# Patient Record
Sex: Female | Born: 1935 | Race: White | Hispanic: No | Smoking: Former smoker
Health system: Southern US, Community
[De-identification: ages and names within clinical notes are randomized; demographics above are authoritative.]

## PROBLEM LIST (undated history)

## (undated) DIAGNOSIS — N952 Postmenopausal atrophic vaginitis: Secondary | ICD-10-CM

## (undated) DIAGNOSIS — F32A Depression, unspecified: Secondary | ICD-10-CM

## (undated) DIAGNOSIS — N390 Urinary tract infection, site not specified: Secondary | ICD-10-CM

## (undated) DIAGNOSIS — E785 Hyperlipidemia, unspecified: Secondary | ICD-10-CM

## (undated) DIAGNOSIS — B958 Unspecified staphylococcus as the cause of diseases classified elsewhere: Secondary | ICD-10-CM

## (undated) DIAGNOSIS — I10 Essential (primary) hypertension: Secondary | ICD-10-CM

## (undated) DIAGNOSIS — N3289 Other specified disorders of bladder: Secondary | ICD-10-CM

## (undated) DIAGNOSIS — IMO0002 Reserved for concepts with insufficient information to code with codable children: Secondary | ICD-10-CM

## (undated) DIAGNOSIS — Z8673 Personal history of transient ischemic attack (TIA), and cerebral infarction without residual deficits: Secondary | ICD-10-CM

## (undated) DIAGNOSIS — N816 Rectocele: Secondary | ICD-10-CM

## (undated) DIAGNOSIS — J189 Pneumonia, unspecified organism: Secondary | ICD-10-CM

## (undated) DIAGNOSIS — F329 Major depressive disorder, single episode, unspecified: Secondary | ICD-10-CM

## (undated) DIAGNOSIS — E7800 Pure hypercholesterolemia, unspecified: Secondary | ICD-10-CM

## (undated) HISTORY — DX: Postmenopausal atrophic vaginitis: N95.2

## (undated) HISTORY — PX: DILATION AND CURETTAGE OF UTERUS: SHX78

## (undated) HISTORY — DX: Urinary tract infection, site not specified: N39.0

## (undated) HISTORY — DX: Rectocele: N81.6

## (undated) HISTORY — PX: EYE SURGERY: SHX253

## (undated) HISTORY — DX: Other specified disorders of bladder: N32.89

## (undated) HISTORY — DX: Hyperlipidemia, unspecified: E78.5

## (undated) HISTORY — DX: Reserved for concepts with insufficient information to code with codable children: IMO0002

## (undated) HISTORY — PX: OTHER SURGICAL HISTORY: SHX169

---

## 2010-06-29 ENCOUNTER — Ambulatory Visit: Payer: Self-pay | Admitting: Family Medicine

## 2011-06-22 ENCOUNTER — Observation Stay: Payer: Self-pay | Admitting: Internal Medicine

## 2011-06-22 LAB — URINALYSIS, COMPLETE
Blood: NEGATIVE
Glucose,UR: NEGATIVE mg/dL (ref 0–75)
Ketone: NEGATIVE
Ph: 5 (ref 4.5–8.0)
Protein: 100
Specific Gravity: 1.012 (ref 1.003–1.030)
Squamous Epithelial: 1

## 2011-06-22 LAB — COMPREHENSIVE METABOLIC PANEL
Albumin: 3.7 g/dL (ref 3.4–5.0)
BUN: 23 mg/dL — ABNORMAL HIGH (ref 7–18)
Bilirubin,Total: 0.4 mg/dL (ref 0.2–1.0)
Calcium, Total: 9.2 mg/dL (ref 8.5–10.1)
Creatinine: 1.13 mg/dL (ref 0.60–1.30)
EGFR (African American): 53 — ABNORMAL LOW
Glucose: 96 mg/dL (ref 65–99)
Osmolality: 283 (ref 275–301)
SGOT(AST): 23 U/L (ref 15–37)
SGPT (ALT): 21 U/L
Sodium: 140 mmol/L (ref 136–145)
Total Protein: 7.5 g/dL (ref 6.4–8.2)

## 2011-06-22 LAB — CBC
HCT: 40.9 % (ref 35.0–47.0)
HGB: 13.2 g/dL (ref 12.0–16.0)
MCH: 27.5 pg (ref 26.0–34.0)
MCHC: 32.4 g/dL (ref 32.0–36.0)
Platelet: 206 10*3/uL (ref 150–440)
RBC: 4.82 10*6/uL (ref 3.80–5.20)
WBC: 7.9 10*3/uL (ref 3.6–11.0)

## 2011-12-05 ENCOUNTER — Inpatient Hospital Stay: Payer: Self-pay | Admitting: Internal Medicine

## 2011-12-05 LAB — CK TOTAL AND CKMB (NOT AT ARMC): CK-MB: 0.6 ng/mL (ref 0.5–3.6)

## 2011-12-05 LAB — COMPREHENSIVE METABOLIC PANEL
Albumin: 3.5 g/dL (ref 3.4–5.0)
Alkaline Phosphatase: 77 U/L (ref 50–136)
Anion Gap: 8 (ref 7–16)
Calcium, Total: 8.8 mg/dL (ref 8.5–10.1)
Chloride: 110 mmol/L — ABNORMAL HIGH (ref 98–107)
Co2: 24 mmol/L (ref 21–32)
Creatinine: 1.09 mg/dL (ref 0.60–1.30)
EGFR (African American): 55 — ABNORMAL LOW
EGFR (Non-African Amer.): 48 — ABNORMAL LOW
Glucose: 95 mg/dL (ref 65–99)
Potassium: 3.8 mmol/L (ref 3.5–5.1)
SGOT(AST): 29 U/L (ref 15–37)

## 2011-12-05 LAB — URINALYSIS, COMPLETE
Bacteria: NONE SEEN
Glucose,UR: NEGATIVE mg/dL (ref 0–75)
Ketone: NEGATIVE
Nitrite: NEGATIVE
Protein: >=500
RBC,UR: 5 /HPF (ref 0–5)
Specific Gravity: 1.022 (ref 1.003–1.030)
Squamous Epithelial: 1
WBC UR: 90 /HPF (ref 0–5)

## 2011-12-05 LAB — PROTIME-INR: Prothrombin Time: 13 secs (ref 11.5–14.7)

## 2011-12-05 LAB — CBC
HGB: 11.9 g/dL — ABNORMAL LOW (ref 12.0–16.0)
MCH: 27.2 pg (ref 26.0–34.0)
MCHC: 32.9 g/dL (ref 32.0–36.0)
MCV: 83 fL (ref 80–100)
RDW: 16.7 % — ABNORMAL HIGH (ref 11.5–14.5)
WBC: 7.9 10*3/uL (ref 3.6–11.0)

## 2011-12-05 LAB — TROPONIN I: Troponin-I: <0.02 ng/mL

## 2011-12-06 LAB — BASIC METABOLIC PANEL
Anion Gap: 8 (ref 7–16)
BUN: 16 mg/dL (ref 7–18)
Co2: 25 mmol/L (ref 21–32)
Creatinine: 1.13 mg/dL (ref 0.60–1.30)
EGFR (African American): 53 — ABNORMAL LOW
EGFR (Non-African Amer.): 46 — ABNORMAL LOW
Glucose: 99 mg/dL (ref 65–99)
Sodium: 146 mmol/L — ABNORMAL HIGH (ref 136–145)

## 2011-12-06 LAB — LIPID PANEL
Cholesterol: 151 mg/dL (ref 0–200)
HDL Cholesterol: 42 mg/dL (ref 40–60)
Ldl Cholesterol, Calc: 78 mg/dL (ref 0–100)
Triglycerides: 157 mg/dL (ref 0–200)
VLDL Cholesterol, Calc: 31 mg/dL (ref 5–40)

## 2011-12-06 LAB — CBC WITH DIFFERENTIAL/PLATELET
Basophil %: 1 %
Eosinophil %: 3.6 %
HCT: 34.4 % — ABNORMAL LOW (ref 35.0–47.0)
HGB: 10.5 g/dL — ABNORMAL LOW (ref 12.0–16.0)
Lymphocyte %: 22.7 %
MCH: 25.3 pg — ABNORMAL LOW (ref 26.0–34.0)
Monocyte #: 0.5 x10 3/mm (ref 0.2–0.9)
Monocyte %: 6.9 %
Neutrophil %: 65.8 %
RBC: 4.15 10*6/uL (ref 3.80–5.20)
WBC: 8 10*3/uL (ref 3.6–11.0)

## 2011-12-06 LAB — TROPONIN I: Troponin-I: 0.05 ng/mL

## 2011-12-06 LAB — MAGNESIUM: Magnesium: 1.5 mg/dL — ABNORMAL LOW

## 2011-12-07 LAB — HEMOGLOBIN: HGB: 11.1 g/dL — ABNORMAL LOW (ref 12.0–16.0)

## 2011-12-07 LAB — BASIC METABOLIC PANEL
Anion Gap: 10 (ref 7–16)
Calcium, Total: 8.6 mg/dL (ref 8.5–10.1)
Chloride: 111 mmol/L — ABNORMAL HIGH (ref 98–107)
Co2: 24 mmol/L (ref 21–32)
Creatinine: 1.23 mg/dL (ref 0.60–1.30)
EGFR (African American): 48 — ABNORMAL LOW
EGFR (Non-African Amer.): 41 — ABNORMAL LOW
Glucose: 100 mg/dL — ABNORMAL HIGH (ref 65–99)
Osmolality: 289 (ref 275–301)
Sodium: 145 mmol/L (ref 136–145)

## 2011-12-07 LAB — URINE CULTURE

## 2011-12-16 ENCOUNTER — Ambulatory Visit: Payer: Self-pay | Admitting: Internal Medicine

## 2013-01-09 ENCOUNTER — Emergency Department: Payer: Self-pay | Admitting: Emergency Medicine

## 2013-01-17 ENCOUNTER — Emergency Department: Payer: Self-pay | Admitting: Emergency Medicine

## 2013-03-12 LAB — CBC
HCT: 38.5 % (ref 35.0–47.0)
HGB: 13 g/dL (ref 12.0–16.0)
MCH: 28.1 pg (ref 26.0–34.0)
MCHC: 33.7 g/dL (ref 32.0–36.0)
MCV: 83 fL (ref 80–100)
Platelet: 127 10*3/uL — ABNORMAL LOW (ref 150–440)
RBC: 4.61 10*6/uL (ref 3.80–5.20)
RDW: 15.8 % — ABNORMAL HIGH (ref 11.5–14.5)
WBC: 8.1 10*3/uL (ref 3.6–11.0)

## 2013-03-12 LAB — COMPREHENSIVE METABOLIC PANEL
ANION GAP: 9 (ref 7–16)
Albumin: 3.6 g/dL (ref 3.4–5.0)
Alkaline Phosphatase: 62 U/L
BUN: 29 mg/dL — ABNORMAL HIGH (ref 7–18)
Bilirubin,Total: 0.5 mg/dL (ref 0.2–1.0)
Calcium, Total: 9 mg/dL (ref 8.5–10.1)
Chloride: 95 mmol/L — ABNORMAL LOW (ref 98–107)
Co2: 25 mmol/L (ref 21–32)
Creatinine: 1.29 mg/dL (ref 0.60–1.30)
EGFR (Non-African Amer.): 39 — ABNORMAL LOW
GFR CALC AF AMER: 45 — AB
GLUCOSE: 91 mg/dL (ref 65–99)
Osmolality: 264 (ref 275–301)
POTASSIUM: 3 mmol/L — AB (ref 3.5–5.1)
SGOT(AST): 45 U/L — ABNORMAL HIGH (ref 15–37)
SGPT (ALT): 19 U/L (ref 12–78)
SODIUM: 129 mmol/L — AB (ref 136–145)
Total Protein: 7.6 g/dL (ref 6.4–8.2)

## 2013-03-12 LAB — CK TOTAL AND CKMB (NOT AT ARMC)
CK, Total: 279 U/L — ABNORMAL HIGH (ref 21–215)
CK-MB: 2 ng/mL (ref 0.5–3.6)

## 2013-03-12 LAB — PRO B NATRIURETIC PEPTIDE: B-Type Natriuretic Peptide: 879 pg/mL — ABNORMAL HIGH (ref 0–450)

## 2013-03-12 LAB — LIPASE, BLOOD: Lipase: 209 U/L (ref 73–393)

## 2013-03-12 LAB — TROPONIN I: Troponin-I: <0.02 ng/mL

## 2013-03-13 ENCOUNTER — Inpatient Hospital Stay: Payer: Self-pay | Admitting: Internal Medicine

## 2013-03-13 LAB — CK-MB
CK-MB: 1.6 ng/mL (ref 0.5–3.6)
CK-MB: 7.9 ng/mL — ABNORMAL HIGH (ref 0.5–3.6)

## 2013-03-13 LAB — BASIC METABOLIC PANEL
Anion Gap: 5 — ABNORMAL LOW (ref 7–16)
BUN: 26 mg/dL — AB (ref 7–18)
CO2: 28 mmol/L (ref 21–32)
CREATININE: 1.28 mg/dL (ref 0.60–1.30)
Calcium, Total: 8.3 mg/dL — ABNORMAL LOW (ref 8.5–10.1)
Chloride: 105 mmol/L (ref 98–107)
GFR CALC AF AMER: 45 — AB
GFR CALC NON AF AMER: 39 — AB
Glucose: 123 mg/dL — ABNORMAL HIGH (ref 65–99)
Osmolality: 282 (ref 275–301)
Potassium: 3.5 mmol/L (ref 3.5–5.1)
SODIUM: 138 mmol/L (ref 136–145)

## 2013-03-13 LAB — TROPONIN I: TROPONIN-I: 0.02 ng/mL

## 2013-03-14 LAB — CBC WITH DIFFERENTIAL/PLATELET
Basophil #: 0 10*3/uL (ref 0.0–0.1)
Basophil %: 0.6 %
Eosinophil #: 0.1 10*3/uL (ref 0.0–0.7)
Eosinophil %: 1.5 %
HCT: 33.5 % — AB (ref 35.0–47.0)
HGB: 11 g/dL — ABNORMAL LOW (ref 12.0–16.0)
Lymphocyte #: 0.7 10*3/uL — ABNORMAL LOW (ref 1.0–3.6)
Lymphocyte %: 12.4 %
MCH: 27.7 pg (ref 26.0–34.0)
MCHC: 32.9 g/dL (ref 32.0–36.0)
MCV: 84 fL (ref 80–100)
MONOS PCT: 6.1 %
Monocyte #: 0.4 x10 3/mm (ref 0.2–0.9)
NEUTROS ABS: 4.6 10*3/uL (ref 1.4–6.5)
Neutrophil %: 79.4 %
PLATELETS: 121 10*3/uL — AB (ref 150–440)
RBC: 3.98 10*6/uL (ref 3.80–5.20)
RDW: 15.9 % — AB (ref 11.5–14.5)
WBC: 5.8 10*3/uL (ref 3.6–11.0)

## 2013-03-14 LAB — LIPID PANEL
CHOLESTEROL: 174 mg/dL (ref 0–200)
HDL: 37 mg/dL — AB (ref 40–60)
LDL CHOLESTEROL, CALC: 112 mg/dL — AB (ref 0–100)
Triglycerides: 126 mg/dL (ref 0–200)
VLDL CHOLESTEROL, CALC: 25 mg/dL (ref 5–40)

## 2013-03-14 LAB — BASIC METABOLIC PANEL
ANION GAP: 6 — AB (ref 7–16)
BUN: 21 mg/dL — ABNORMAL HIGH (ref 7–18)
Calcium, Total: 8.3 mg/dL — ABNORMAL LOW (ref 8.5–10.1)
Chloride: 105 mmol/L (ref 98–107)
Co2: 24 mmol/L (ref 21–32)
Creatinine: 1.05 mg/dL (ref 0.60–1.30)
EGFR (African American): 57 — ABNORMAL LOW
EGFR (Non-African Amer.): 49 — ABNORMAL LOW
GLUCOSE: 101 mg/dL — AB (ref 65–99)
Osmolality: 273 (ref 275–301)
Potassium: 3.7 mmol/L (ref 3.5–5.1)
SODIUM: 135 mmol/L — AB (ref 136–145)

## 2013-03-14 LAB — TSH: Thyroid Stimulating Horm: 1.2 u[IU]/mL

## 2013-03-14 LAB — MAGNESIUM: Magnesium: 1.5 mg/dL — ABNORMAL LOW

## 2013-03-17 LAB — CULTURE, BLOOD (SINGLE)

## 2013-07-24 ENCOUNTER — Emergency Department: Payer: Self-pay | Admitting: Emergency Medicine

## 2013-07-24 LAB — CBC
HCT: 38.5 % (ref 35.0–47.0)
HGB: 12.2 g/dL (ref 12.0–16.0)
MCH: 26.2 pg (ref 26.0–34.0)
MCHC: 31.8 g/dL — AB (ref 32.0–36.0)
MCV: 83 fL (ref 80–100)
Platelet: 160 10*3/uL (ref 150–440)
RBC: 4.66 10*6/uL (ref 3.80–5.20)
RDW: 16.4 % — AB (ref 11.5–14.5)
WBC: 6 10*3/uL (ref 3.6–11.0)

## 2013-07-24 LAB — TROPONIN I
Troponin-I: <0.02 ng/mL
Troponin-I: <0.02 ng/mL

## 2013-07-24 LAB — COMPREHENSIVE METABOLIC PANEL
ALBUMIN: 3.2 g/dL — AB (ref 3.4–5.0)
ALK PHOS: 57 U/L
ANION GAP: 5 — AB (ref 7–16)
BUN: 18 mg/dL (ref 7–18)
Bilirubin,Total: 0.3 mg/dL (ref 0.2–1.0)
CO2: 29 mmol/L (ref 21–32)
Calcium, Total: 9.1 mg/dL (ref 8.5–10.1)
Chloride: 106 mmol/L (ref 98–107)
Creatinine: 1.11 mg/dL (ref 0.60–1.30)
GFR CALC AF AMER: 54 — AB
GFR CALC NON AF AMER: 46 — AB
Glucose: 93 mg/dL (ref 65–99)
Osmolality: 281 (ref 275–301)
POTASSIUM: 3.9 mmol/L (ref 3.5–5.1)
SGOT(AST): 17 U/L (ref 15–37)
SGPT (ALT): 11 U/L — ABNORMAL LOW (ref 12–78)
Sodium: 140 mmol/L (ref 136–145)
Total Protein: 6.5 g/dL (ref 6.4–8.2)

## 2013-07-24 LAB — APTT: Activated PTT: 28.1 secs (ref 23.6–35.9)

## 2013-07-24 LAB — URINALYSIS, COMPLETE
BILIRUBIN, UR: NEGATIVE
BLOOD: NEGATIVE
Glucose,UR: NEGATIVE mg/dL (ref 0–75)
Hyaline Cast: 2
Ketone: NEGATIVE
Leukocyte Esterase: NEGATIVE
Nitrite: NEGATIVE
PH: 6 (ref 4.5–8.0)
Protein: >=500
RBC,UR: NONE SEEN /HPF (ref 0–5)
SQUAMOUS EPITHELIAL: NONE SEEN
Specific Gravity: 1.013 (ref 1.003–1.030)
WBC UR: <1 /HPF (ref 0–5)

## 2013-07-24 LAB — PRO B NATRIURETIC PEPTIDE: B-TYPE NATIURETIC PEPTID: 524 pg/mL — AB (ref 0–450)

## 2014-04-07 DIAGNOSIS — N183 Chronic kidney disease, stage 3 unspecified: Secondary | ICD-10-CM | POA: Insufficient documentation

## 2014-06-03 NOTE — Discharge Summary (Signed)
PATIENT NAME:  Martha Hunter, Martha Hunter MR#:  [id number] DATE OF BIRTH:  December 12, 1928  DATE OF ADMISSION:  12/05/2011 DATE OF DISCHARGE:  12/07/2011  ADMITTING DIAGNOSES:  1. Altered mental status. 2. Stroke.  DISCHARGE DIAGNOSES:  1. Transient ischemic attack with expressive aphasia. 2. Altered mental status, resolved, likely malignant hypertension related, questionable hypertensive encephalopathy.  3. Hyperlipidemia with LDL 78.  4. Hypokalemia.  5. Hypomagnesemia.  6. Urinary tract infection.   DISCHARGE CONDITION: Stable.   DISCHARGE MEDICATIONS: The patient is to resume her outpatient medications which are:  1. Diazepam 5 mg p.o. daily.  2. Losartan 100 mg p.o. daily. 3. Meclizine 25 mg p.o. three times daily as needed.  4. Pravastatin 20 mg p.o. at bedtime.  5. Sertraline 100 mg p.o. tablet 1-1/2 tablets once daily.  6. Hydralazine 50 p.o. every eight hours. 7. Clopidogrel 75 mg p.o. daily.  8. Amlodipine 5 mg p.o. twice daily 9. Ciprofloxacin 500 mg p.o. twice daily for one more day.   DO NOT TAKE: The patient is not to take aspirin therapy.   HOME OXYGEN: None.   DIET: 2 gram salt, low fat, low cholesterol, mechanical soft.  ACTIVITY LIMITATIONS: As tolerated.   REFERRALS:  1. Home health physical therapy. 2. RN aide. 3. Occupational therapy. 4. Child psychotherapistocial worker.   FOLLOW-UP: Follow-up appointment with Dr. Randa LynnLamb in two days after discharge.   CONSULTANT: Care Management   RADIOLOGICAL STUDIES: 1. Chest, portable, single view, 12/05/2011, showed cardiomegaly with pulmonary vascular prominence. No pulmonary edema or focal infiltrate noted. 2. CT scan of head without contrast showed chronic ischemic changes. 3. Bilateral Doppler ultrasound of carotid arteries showed no hemodynamically significant carotid artery stenosis.  4. MRI of brain without contrast 12/06/2011 revealed chronic ischemic changes without evidence of acute ischemia.   5. Echocardiogram on 12/06/2011 revealed  left ventricular systolic function normal. Ejection fraction of 55%. There is mild to moderate mitral regurgitation. There is mild to moderate tricuspid regurgitation but no apparent source of CVA was noted.   REASON FOR ADMISSION: The patient is an 81 year old Caucasian female with past medical history significant for history of dementia who presented to the hospital with complaints of headaches as well as nausea and difficulty expressing words. Please refer to Dr. Mathews RobinsonsWieting's admission note on 12/05/2011.   PHYSICAL EXAMINATION: On arrival to the hospital, temperature 98, pulse 61, respiratory rate 18, blood pressure 224/92. Physical exam was unremarkable.   LABORATORY DATA: BMP on 12/05/2011 was unremarkable, however, the patient's estimated GFR for non-African American was low at 48. Liver enzymes were normal. The patient's cardiac enzymes x3 were within normal limits. The patient's white blood cell count was normal at 7.9, hemoglobin was low at 11.9, platelet count 175. Coagulation panel was unremarkable. Urinalysis revealed yellow hazy urine, negative for glucose or ketones, specific gravity 1.022, pH 5.0, negative for blood, more than 500 protein, negative for nitrites, 1+ leukocyte esterase, 5 red blood cells, 90 white blood cells. No bacteria were seen.   EKG showed normal sinus rhythm at 63 beats per minute, left ventricular hypertrophy with repolarization abnormality but no significant ST-T changes were noted.   The patient's CT scan of head as well as chest x-ray were unremarkable.   HOSPITAL COURSE:  1. The patient was admitted to the hospital concerns about possible stroke and stroke evaluation was entertained. The patient underwent carotid ultrasound as well as echocardiogram which were unremarkable. She also had MRI of her brain which did not show evidence of acute ischemia. Telemetry  also showed only sinus rhythm with no arrhythmias. Over a period of time especially with medications to  control her blood pressure, her condition improved and her speech improved as well. However, because she continued to have some slurring of speech and was concerned that she probably had some element of and maybe even small stroke, because the patient was on aspirin in the past we made decision to change her aspirin to Plavix to better protect her. We also made decision to advance her blood pressure medications and Norvasc at 5 mg twice daily dose was added. With this therapy the patient's blood pressure improved, however, it fluctuated while the patient was in the hospital. The patient was evaluated by speech therapist as well as physical therapist who felt that the patient would benefit from rehabilitation placement but because the patient lived at home with her demented husband the patient's family decided that it would be most appropriate for her to return back home with home health as well as possibly some help at home from outside for her as well as for her husband. Home health agency was concerned about that and Child psychotherapist as well as Occupational Therapy will be added for the patient upon discharge. It is recommended to follow her condition as outpatient, especially her blood pressure, and make decisions if blood pressure medications need to be advanced at all as outpatient or not. The patient was also evaluated for possible risk factors for stroke. A lipid panel was performed and LDL was found to be 78. The patient's cholesterol level was 151, triglycerides 157, and HDL was 42. It was felt that the patient's cholesterol was well controlled so no adjustments in cholesterol medications were made. It was felt that the patient's condition was very likely related to her malignant hypertension. 2. In regards to altered mental status, the patient's somnolence resolved as time progressed.  3. For hyperlipidemia, as mentioned above the patient is to continue her cholesterol medications. Her LDL was 78.  4. The  patient was noted to be hypokalemic as well as hypomagnesemic, thus she was supplemented while she was in the hospital. It is recommended to follow the patient's potassium as well as magnesium levels as outpatient.  5. The patient was noted to have pyuria while in the hospital which was felt to be due to urinary tract infection. The patient was started on ciprofloxacin while she was in the hospital and ciprofloxacin was given for her at home for one more day. However, upon discharge the patient was noted to have mixed bacterial organisms in her clean catch cultures which was thought to be contamination. The patient actually would not need antibiotics, however, she will use ciprofloxacin for one day for three day therapy only.   DISPOSITION: The patient is being discharged in stable condition with above-mentioned medications and follow-up.   VITAL SIGNS ON THE DAY OF DISCHARGE: Temperature 98.1, pulse 62, respiration rate 18, blood pressure 160 to 170's and even 180's systolic and 70's to 80's diastolic. Oxygen saturation was 92% on room air at rest.   TIME SPENT: 40 minutes.   ____________________________ Katharina Caper, MD rv:drc D: 12/07/2011 17:49:55 ET T: 12/08/2011 10:28:34 ET JOB#: [id number]  cc: Katharina Caper, MD, <Dictator> Reola Mosher. Randa Lynn, MD Katharina Caper MD ELECTRONICALLY SIGNED 12/30/2011 12:30

## 2014-06-03 NOTE — H&P (Signed)
PATIENT NAME:  Martha DialsKENNETT, Makiya MR#:  [id number] DATE OF BIRTH:  August 16, 1928  DATE OF ADMISSION:  12/05/2011  PRIMARY CARE PHYSICIAN: [name], MD  CHIEF COMPLAINT: Terrible headache.   HISTORY OF PRESENT ILLNESS: This is an 81 year old female who presents to the ER with terrible headache from 3:00 p.m. today with some nausea and she has also had some trouble finding her words and expressing them, difficult finding the words that she wants to stay and some slurred speech. Normally she walks with a walker and she is not complaining of any weakness one side versus the other or any difficulty swallowing. In the Emergency Room, she had a CT scan of the head that showed prominent lucencies in bilateral basal ganglia consistent with chronic infarcts. Her blood pressure was also very high, 200/100, and hospitalist services were contacted for further evaluation. The patient states that she last saw Dr. Randa LynnLamb about three weeks ago and her blood pressure was okay at that time, usually systolic of 140.   PAST MEDICAL HISTORY:  1. Hypertension.  2. Depression. 3. Prior cerebrovascular accident.  4. Hyperlipidemia.  5. Dizziness.   PAST SURGICAL HISTORY: Cataract on the right.   ALLERGIES: Penicillin, niacin, and sulfa.   MEDICATIONS:  1. Aspirin 81 mg daily.  2. Diazepam 5 mg daily.  3. Hydralazine 50 mg twice a day. 4. Losartan 100 mg daily.  5. Meclizine 25 mg as needed for dizziness. 6. Pravastatin 20 mg at bedtime.  7. Zoloft 150 mg daily.   SOCIAL HISTORY: No smoking. Occasional alcohol. No drug use. Worked as a Psychologist, forensiclegal secretary. Lives with husband.   FAMILY HISTORY: Mother died at 7755 of heart issue. Father died in his mid 4560s of a heart related issue. Numerous siblings have died of heart related issues in their 4960s and 1570s. Hypertension in her siblings. Another brother died of leukemia.   REVIEW OF SYSTEMS: CONSTITUTIONAL: Positive for chills. No fever or sweats. Positive for weight loss. No  weakness or fatigue. EYES: She does wear glasses. EARS, NOSE, MOUTH, AND THROAT: Decreased hearing. Positive for runny nose. Positive for nose stopped up. Positive for sore throat. No difficulty swallowing. CARDIOVASCULAR: No chest pain. No palpitations. RESPIRATORY: No shortness of breath. No coughing. No sputum. No hemoptysis. GASTROINTESTINAL: Positive for nausea. No vomiting. No abdominal pain. No diarrhea. No constipation. No bright red blood per rectum. No melena. GENITOURINARY: No burning on urination or hematuria. MUSCULOSKELETAL: No joint pain or muscle pain. INTEGUMENT: No rashes or eruptions. NEUROLOGIC: No fainting or blackouts. Difficulty with speech. PSYCHIATRIC: Positive for depression. ENDOCRINE: No thyroid problems. HEMATOLOGIC/LYMPHATIC: No anemia.   PHYSICAL EXAMINATION:   VITAL SIGNS: On presentation temperature was 98, pulse 61, and respirations 18. Initially blood pressure was not recorded, then it was 224/92.   GENERAL: No respiratory distress.   EYES: Conjunctivae and lids normal. Pupils equal, round, and reactive to light. Extraocular muscles intact. No nystagmus.   EARS, NOSE, MOUTH, AND THROAT: Tympanic membranes no erythema. Nasal mucosa no erythema. Throat no erythema. No exudate seen. Lips and gums no lesions.   NECK: No JVD. No bruits. No lymphadenopathy. No thyromegaly. No thyroid nodules palpated.   RESPIRATORY: Lungs are clear to auscultation. No use of accessory muscles to breathe. No rhonchi, rales, or wheeze heard.   CARDIOVASCULAR: S1 and S2 normal. No gallops, rubs, or murmurs heard. Carotid upstroke 2+ bilaterally. No bruits.   EXTREMITIES: Dorsalis pedis pulses 2+ bilaterally. Trace edema of the lower extremities.   ABDOMEN: Soft and  nontender. No organomegaly/splenomegaly. Normoactive bowel sounds. No masses felt.   LYMPHATIC: No lymph nodes in the neck.   MUSCULOSKELETAL: Trace edema. No clubbing. No cyanosis.   SKIN: No ulcers or lesions seen.  Birthmark on the right face.   NEUROLOGIC: Cranial nerves II through XII are grossly intact. Deep tendon reflexes 2+ on the left lower extremity, 1/2+ on the right lower extremity. Babinski is negative. Power 5/5 in upper and lower extremities. Finger-nose intact bilaterally. The patient is slow with her answers and deliberate with slight slur of the speech.   PSYCHIATRIC: The patient is oriented to person, place, and time. Difficulty getting out the words that she wants to say.  LABORATORY, DIAGNOSTIC AND RADIOLOGIC DATA: Urinalysis: 1+ leukocyte esterase.   White blood cell count 7.9, hemoglobin and hematocrit 11.9 and 36.2, and platelet count 175. INR 0.9. Glucose 95, BUN 18, creatinine 1.09, sodium 142, potassium 3.8, chloride 110, CO2 24, and calcium 8.8. Liver function tests normal range. GFR 48. Troponin negative.   Chest x-ray: No infiltrate or effusion or cardiomegaly.  CT scan of the head showed chronic ischemic change, chronic lucencies of bilateral basal ganglia most likely chronic infarcts.  EKG: Normal sinus rhythm at 63 beats per minute, left ventricular hypertrophy, nonspecific ST-T wave changes. Poor R wave progression.    ASSESSMENT AND PLAN:  1. Suspected CVA versus hypertensive encephalopathy. The patient is with difficulty getting her thoughts into words. We will get a MRI of the brain, carotid ultrasound, and echocardiogram. Speech therapy consultation. PT consultation. We will take a step up from aspirin and go with Plavix.  2. Malignant hypertension. With the possibility of stroke, goal systolic blood pressure this evening will be 160. I will increase her hydralazine to 50 mg three times daily, add 2.5 mg of Norvasc at bedtime and continue the losartan. The patient had bradycardia with beta blockers in the past so I will avoid beta blockers at this point.  3. Hyperlipidemia. Check a lipid profile in the morning. Continue pravastatin.  4. Positive urinalysis. No urinary  symptoms. I will send off a urine culture and start p.o. Cipro at this point.  5. Depression. Continue Zoloft.  CODE STATUS: FULL CODE.  TIME SPENT ON ADMISSION: 55 minutes. ____________________________ Herschell Dimes. Renae Gloss, MD rjw:slb D: 12/05/2011 22:28:34 ET T: 12/06/2011 07:33:33 ET JOB#: [id number]  cc: Herschell Dimes. Renae Gloss, MD, <Dictator> Reola Mosher. Randa Lynn, MD Salley Scarlet MD ELECTRONICALLY SIGNED 12/07/2011 0:11

## 2014-06-07 NOTE — H&P (Signed)
PATIENT NAME:  Martha Hunter, Martha Hunter MR#:  [id number] DATE OF BIRTH:  22-Oct-1928  DATE OF ADMISSION:  03/13/2013  PRIMARY CARE PHYSICIAN: [name], MD  REFERRING EMERGENCY ROOM PHYSICIAN: Enedina FinnerRandolph N. Manson PasseyBrown, MD  CHIEF COMPLAINT: Chest tightness, shortness of breath and cough for 2 days.   HISTORY OF PRESENT ILLNESS: The patient is an 81 year old female who is complaining of cough associated with shortness of breath and chest tightness for the past 2 days. The patient was diagnosed with the flu yesterday, and she was started on Tamiflu by her primary care physician, but the patient was not doing well, feeling tight in her chest, associated with cough, and pulse oximetry was at 86% on room air. The patient was brought into the ER by her son. The patient is just feeling weak and tired and complaining of productive cough. She is not using any accessory muscles during my examination, but has gurgly breathing sounds. Denies any abdominal pain, nausea, vomiting, diarrhea. Denies any headaches or blurry vision. No other complaints. No similar complaints in the past.   PAST MEDICAL HISTORY:  1. Hypertension.  2. Depression.  3. History of CVA.  4. Hyperlipidemia.  5. Dizziness.   PAST SURGICAL HISTORY: Cataract repair.  ALLERGIES: SHE IS ALLERGIC TO PENICILLIN, NIACIN AND SULFA.   HOME MEDICATIONS: 1. Toviaz 1 tablet p.o. once a day. 2. Zoloft 100 mg 2 tablets once a day. 3. Pravastatin 20 mg once daily.  4. Losartan 100 mg p.o. once daily.  5. Hydralazine 50 mg p.o. 2 times a day. 6. Diazepam 5 mg 1 to 2 tablets as needed.  7. Plavix 75 mg once daily. 8. Atenolol 25 mg once daily.  9. Amlodipine 5 mg once daily.   PSYCHOSOCIAL HISTORY: Lives alone. Denies any smoking, alcohol or illicit drug usage.   FAMILY HISTORY: Mother died at age 79 of heart disease. Father died in his mid 2560s from heart issues. Siblings have hypertension, and her brother died from leukemia.   REVIEW OF SYSTEMS:   CONSTITUTIONAL: Denies any fatigue, but complaining of low-grade fever.  EYES: Denies blurry vision, double vision.  ENT: Denies epistaxis or discharge.  RESPIRATION: Complaining of productive cough. Has shortness of breath.  CARDIOVASCULAR: No chest pain, but complaining of tightness. No palpitations.  GASTROINTESTINAL: Denies nausea, vomiting, diarrhea. GYNECOLOGIC AND BREASTS: Denies breast mass or vaginal discharge.  ENDOCRINE: Denies polyuria, nocturia. Denies any hypothyroidism.  INTEGUMENTARY: No acne, rash, lesions.  MUSCULOSKELETAL: No joint pain in the neck and back. NEUROLOGIC: No vertigo or ataxia. PSYCHIATRIC: No ADD or OCD.    PHYSICAL EXAMINATION: VITAL SIGNS: Temperature 98.7, pulse 56, respirations 24, blood pressure 119/63, pulse oximetry 97%.  GENERAL APPEARANCE: Not under acute distress. Moderately built and nourished.  HEENT: Normocephalic, atraumatic. Pupils are equally reacting to light and accommodation. Dry mucous membranes.  NECK: Supple. No JVD. No thyromegaly. Range of motion is intact.  LUNGS: Moderate air entry. Gurgling breath sounds, coarse in nature, positive rales and rhonchi.  CARDIAC: S1, S2 normal. Regular rhythm.  GASTROINTESTINAL: Soft. Bowel sounds are positive in all 4 quadrants. Nontender, nondistended. No hepatosplenomegaly. No masses. NEUROLOGIC: Awake, alert and oriented x3. Motor and sensory grossly intact. Reflexes are 2+.  EXTREMITIES: No edema. No cyanosis. No clubbing.  SKIN: Warm to touch. Normal turgor. No rashes. No lesions.  MUSCULOSKELETAL: No joint effusion, tenderness, erythema. PSYCHIATRIC: Normal mood and affect.   LABORATORY AND IMAGING STUDIES: BNP 879. Glucose 91, BUN 29, creatinine 1.29, sodium 129, potassium 3.0, chloride 95,  CO2 25, GFR 39, anion gap is 9, serum osmolality 264, calcium 9.0, lipase 209. LFTs are normal except AST is 45. Cardiac enzymes: CK total 279, CPK-MB 2.0, troponin less than 0.02 x2. WBC 8.1,  hemoglobin 13.0, hematocrit 38.5, platelets 127. Urine and blood cultures: No growth. Chest x-ray, PA and lateral views: Cardiomegaly with mild hyperinflation. No interval change or acute process. A 12-lead EKG: Sinus bradycardia with left axis deviation.   ASSESSMENT AND PLAN: An 81 year old female with past medical history of migraine, stroke and hypertension, who is presenting to the ER with a chief complaint of shortness of breath associated with cough for the past 2 days. She was diagnosed with flu and on Tamiflu for 1 day.   1. Acute respiratory distress, probably from upper respiratory infection from flu, with probably superimposed developing bacterial pneumonia. Will give her oxygen for hypoxemia, IV levofloxacin.  Continue Tamiflu.  2. Chronic respiratory failure mainly from chronic obstructive pulmonary disease, not under exacerbation. Will provide her nebulizer treatments as-needed basis.  3. Hypertension. 4. Hyperlipidemia. Continue her home medication.  5. Influenza. Continue Tamiflu.  6. History of migraines. The patient denies any migraine headaches at this time.  7. Past history of cerebrovascular accident.  8. Will provide gastrointestinal and deep vein thrombosis prophylaxis.  Plan of care was discussed with the patient. She is aware of the plan.   CODE STATUS: She is full code. Son is the medical power of attorney.   TOTAL TIME SPENT ON ADMISSION: 45 minutes.   ____________________________ Ramonita Lab, MD ag:lb D: 03/13/2013 07:27:18 ET T: 03/13/2013 07:57:47 ET JOB#: [id number]  cc: Ramonita Lab, MD, <Dictator> Reola Mosher. Randa Lynn, MD  Ramonita Lab MD ELECTRONICALLY SIGNED 03/24/2013 7:25

## 2014-06-07 NOTE — Discharge Summary (Signed)
PATIENT NAME:  Martha Hunter, LARICCIA MR#:  [id number] DATE OF BIRTH:  17-Apr-1928  DATE OF ADMISSION:  03/13/2013 DATE OF DISCHARGE:  03/19/2013  PRIMARY CARE PHYSICIAN: Used to see Dr. Randa Lynn  CHIEF COMPLAINT: Chest tightness, shortness of breath, and cough.   DISCHARGE DIAGNOSES: 1.  Acute respiratory failure secondary to suspected community-acquired pneumonia and influenza.  2.  History of hypertension.  3.  Depression.  4.  History of stroke.  5.  Hyperlipidemia.  6.  History of dizziness.  7.  Hyponatremia.   DISCHARGE MEDICATIONS: Losartan 100 mg once a day, pravastatin 20 mg daily, clopidogrel 75 mg daily, amlodipine 5 mg daily, hydralazine 50 mg 2 times a day, sertraline 100 mg 2 tabs once a day, atenolol 25 mg 1/2 tab once a day, Toviaz 4 mg extended-release once daily, diazepam 5 mg 1 to 2 tabs orally once a day as needed for anxiety.   HOME OXYGEN: She will be going to rehab with 2 liters of oxygen by nasal cannula continuous. Wean as tolerated.   DISCHARGE DIET: Low sodium.   DISCHARGE ACTIVITY: As tolerated.   DISCHARGE FOLLOWUP AND INSTRUCTIONS: Please follow with PCP within 1 to 2 weeks. Repeat x-ray of the chest in 4 to 6 weeks to re-evaluate for resolution of the pneumonia.   DISPOSITION: To rehab.   CODE STATUS: FULL code.   SIGNIFICANT LABS AND IMAGING: Initial white count 8.1, hemoglobin 13, platelets 127. Blood cultures: No growth to date. Troponins were negative x3. Initial CK total was 279. Initial 2 CK-MBs were not elevated. Third CK-MB was 7.9. Initial BUN 29, creatinine 1.29, sodium 129, and potassium 3. Last sodium 135, potassium 3.7, and creatinine 1.05.   Chest PA and lateral x-ray on January 27th showed cardiomegaly with mild hyperinflation and no interval change or acute process. On the 29th, x-ray of the chest, 1 view: Limited study by poor inspiration, but hazy basilar atelectasis or infiltrate.  HISTORY OF PRESENT ILLNESS AND HOSPITAL COURSE: For full details  of H and P, please see the dictation on January 28th by Dr. Amado Coe, but briefly this is an 81 year old female with history of hypertension, depression, stroke, and hyperlipidemia who came in for shortness of breath, chest tightness. She had gone to her PCP and was started on Tamiflu for suspected flu, although not checked. She had been having low pulse ox of 86% and came into the hospital. She was admitted to the hospitalist service for acute respiratory failure and suspected community-acquired pneumonia with possible influenza as well and here was started on Levaquin, renally dosed, and Tamiflu was resumed for suspected influenza as well as she did have some sick contacts as an outpatient. She had no leukocytosis or significant fevers here.   In regards to the acute respiratory failure, she is still on oxygen and is being weaned off, but at this point she appears to be requiring oxygen. That is likely secondary to pneumonia and influenza. She has finished treatment with renally dosed Levaquin and has finished 5 days of Tamiflu for her suspected influenza as well. She does not appear to be short of breath nor labored at this time. She was also given nebs and at this time has been seen by physical therapy and their recommendation is rehab. She did have mild hyponatremia as well which has resolved and that is secondary to dehydration and some GI losses that has since resolved. She was on IV fluid and that has been stopped at this time. The patient is FULL  code.   TOTAL TIME SPENT: 35 minutes.  ____________________________ Martha Hunter Lilyona Richner, MD sa:sb D: 03/19/2013 15:17:39 ET T: 03/19/2013 15:35:55 ET JOB#: [id number]  cc: Martha Hunter Martha Arrick, MD, <Dictator> Martha Hunter Graci Hulce MD ELECTRONICALLY SIGNED 03/30/2013 10:57

## 2014-06-08 NOTE — H&P (Signed)
PATIENT NAME:  Martha Hunter, Martha Hunter MR#:  [id number] DATE OF BIRTH:  1929/01/18  DATE OF ADMISSION:  06/22/2011  PRIMARY CARE PHYSICIAN: Dr. Randa LynnLamb REFERRING PHYSICIAN: Dr. Mayford KnifeWilliams, ED physician   CHIEF COMPLAINT: Dizziness for four days.   HISTORY OF PRESENT ILLNESS: 81 year old Caucasian female with a history of hypertension, hyperlipidemia, vertigo presented to the ED with dizziness for four days. After patient has vertigo and dizziness on and off for a long time she got ENT procedure last week for dizziness and vertigo but she feels fine but later she developed worsening dizziness. She said the dizziness is different from vertigo. In addition she feels weak and has imbalance when walking. She also has some headache but denies any weight loss or weight gain. No chest pain, palpitations, orthopnea, or nocturnal dyspnea. No fever, chills. No cough, sputum, shortness of breath or hemoptysis. Patient came to the ED for further evaluation. ED physician, Dr. Mayford KnifeWilliams, ordered MRI and MRA and admitted patient for observation.   SOCIAL HISTORY: No smoking, alcohol drinking, or illicit drugs.   FAMILY HISTORY: Hypertension.   ALLERGIES: No.   MEDICATIONS:  1. Atenolol 50 mg p.o. daily. 2. Diazepam 5 mg p.o. twice daily p.r.n.  3. Hydralazine 50 mg p.o. daily.  4. Losartan 100 mg p.o. daily.  5. Meclizine 25 mg p.o. daily but for the past few days patient did not take this medication.  6. Nortriptyline 25 mg p.o. at bedtime.  7. Pravastatin 20 mg p.o. at bedtime.   REVIEW OF SYSTEMS: CONSTITUTIONAL: Patient denies any fever or chills but has headache and dizziness and weakness. ENT: No double vision, blurred vision but has right-sided cataract surgery. No epistaxis or postnasal drip. No dysphagia or slurred speech. RESPIRATORY: No cough, sputum, shortness of breath or hemoptysis. CARDIOVASCULAR: No chest pain, palpitation, orthopnea, or nocturnal dyspnea. No melena or bloody stool. GENITOURINARY: No  dysuria, hematuria, or incontinence. HEMATOLOGY: No easy bruising or bleeding. ENDOCRINE: No polyuria, polydipsia. No heat or cold intolerance. NEUROLOGY: No syncope, loss of consciousness or seizure.   PHYSICAL EXAMINATION:  VITAL SIGNS: Temperature 97, blood pressure 153/69, pulse 58, respirations 18, oxygen saturation 94% on room air.   GENERAL: Patient is alert, awake, oriented in no acute distress.   HEENT: Pupils are round, equal, reactive to light and accommodation. Moist oral mucosa. Clear oropharynx.  NECK: Supple. No JVD or carotid bruits. No adenopathy. No thyromegaly.    CARDIOVASCULAR: S1, S2 regular rate, rhythm. No murmurs, gallops.   PULMONARY: Bilateral air entry. No wheezing or rales.    ABDOMEN: Soft. No distention. No tenderness. No organomegaly. Bowel sounds present.   EXTREMITIES: No edema, clubbing, or cyanosis. No calf tenderness.   SKIN: No rash or jaundice.   NEUROLOGY: Alert and oriented x3. No focal deficit. Power 5/5. Sensation intact. Deep tendon reflexes 2+.   LABORATORY, DIAGNOSTIC, AND RADIOLOGICAL DATA: CBC normal. Glucose 96, BUN 23, creatinine 1.13. Electrolytes normal. Urinalysis negative. CAT scan of head: No acute intracranial process, chronic small vessel ischemic disease.   IMPRESSION:  1. Dizziness, unknown etiology.  2. Vertigo.   3. Hypertension, uncontrolled.  4. Hyperlipidemia.   PLAN OF TREATMENT:  1. Patient and will be placed for observation. Will get MRI and MRA of the brain. Continue magnesium.  2. Will continue hypertension medication and add aspirin 81 mg p.o. daily.  3. GI and deep vein thrombosis prophylaxis.   Discussed the patient's situation with the patient and the patient's son.   TIME SPENT: About 60 minutes.  ____________________________ Shaune Pollack, MD qc:cms D: 06/22/2011 16:26:56 ET T: 06/22/2011 17:05:55 ET JOB#: [id number]  cc: Shaune Pollack, MD, <Dictator> Reola Mosher. Randa Lynn, MD Shaune Pollack MD ELECTRONICALLY SIGNED  06/22/2011 21:56

## 2014-06-08 NOTE — Discharge Summary (Signed)
PATIENT NAME:  Martha Hunter, Martha Hunter MR#:  [id number] DATE OF BIRTH:  01-03-1929  DATE OF ADMISSION:  06/22/2011 DATE OF DISCHARGE:  06/23/2011  PRIMARY CARE PHYSICIAN: [name], MD    ENT: [name], MD   DISCHARGE DIAGNOSES:  1. Intermittent dizziness, could be due to significant bradycardia. Atenolol was stopped. 2. Sinus bradycardia, could be due to atenolol, now stopped.   SECONDARY DIAGNOSES:  1. Hyperlipidemia. 2. Hypertension.   CONSULTATION: Physical therapy    PROCEDURES/RADIOLOGY:  1. CT scan of the head without contrast on May 8th showed no acute intracranial process. Chronic small vessel ischemic disease.  2. MRI and MRA of the brain with and without contrast on May 9th showed no acute intracranial findings. Chronic small vessel ischemic disease. Patent basilar artery and intracranial vertebral arteries.   MAJOR LABORATORY PANEL: Urinalysis on admission was negative.   HISTORY AND SHORT HOSPITAL COURSE: The patient is an 81 year old female with the above-mentioned medical problems who was admitted for feeling dizzy. She underwent neurological work-up including MRI and MRA of the brain which was negative. She was found to have significant bradycardia with heart rate dropping in low 50's due to which her atenolol was stopped. She was evaluated by physical therapy and was doing okay and was recommended discharge home and using her rollator walker to walk as she did have some unsteady gait without some assistive device per physical therapy. After discussion with the patient and her son, she was discharged back home in stable condition.   On the date of discharge, her vital signs were as follows: Temperature 97.8, heart rate 53 per minute, respirations 20 per minute, blood pressure 140/80 mmHg. She was saturating 94% on room air.   PERTINENT PHYSICAL EXAMINATION ON THE DATE OF DISCHARGE: CARDIOVASCULAR: S1, S2 normal. No murmurs, rubs, or gallops. LUNGS: Clear to auscultation  bilaterally. No wheezing, rales, rhonchi, or crepitation. ABDOMEN: Soft, benign. NEUROLOGIC: Nonfocal examination. All of other physical examination remained at baseline.   DISCHARGE MEDICATIONS:  1. Diazepam 5 mg p.o. b.i.d.  2. Losartan 100 mg p.o. daily. 3. Nortriptyline 25 mg p.o. at bedtime. 4. Pravastatin 20 mg p.o. daily.  5. Meclizine 25 mg p.o. t.i.d. as needed.  6. Hydralazine 50 mg p.o. b.i.d.   DISCHARGE DIET: Low sodium.   DISCHARGE ACTIVITY: As tolerated.   DISCHARGE INSTRUCTIONS AND FOLLOW-UP:  1. The patient was instructed to stop her atenolol as that could be cause of her bradycardia and subsequently dizziness also. 2. She will need follow-up with her primary care physician, Dr. [name], in 1 to 2 weeks.  3. She will need follow-up with Dr. Linus Salmonshapman McQueen in 2 to 3 weeks.   TOTAL TIME DISCHARGING THIS PATIENT: 45 minutes.   ____________________________ [name], MD vss:drc D: 06/23/2011 22:35:08 ET T: 06/24/2011 11:06:39 ET JOB#: [id number]  cc: Keishla Oyer S. Sherryll BurgerShah, MD, <Dictator> [name], MD [name], MD Ellamae SiaVIPUL S [facility] MD ELECTRONICALLY SIGNED 06/24/2011 14:11

## 2014-07-19 ENCOUNTER — Inpatient Hospital Stay
Admission: EM | Admit: 2014-07-19 | Discharge: 2014-07-23 | DRG: 193 | Disposition: A | Discharge status: Skilled Nursing Facility | Payer: Medicare Other | Attending: Internal Medicine | Admitting: Internal Medicine

## 2014-07-19 ENCOUNTER — Encounter: Payer: Self-pay | Admitting: Emergency Medicine

## 2014-07-19 DIAGNOSIS — R109 Unspecified abdominal pain: Secondary | ICD-10-CM | POA: Diagnosis present

## 2014-07-19 DIAGNOSIS — Z87891 Personal history of nicotine dependence: Secondary | ICD-10-CM

## 2014-07-19 DIAGNOSIS — Z7902 Long term (current) use of antithrombotics/antiplatelets: Secondary | ICD-10-CM

## 2014-07-19 DIAGNOSIS — Z882 Allergy status to sulfonamides status: Secondary | ICD-10-CM

## 2014-07-19 DIAGNOSIS — J189 Pneumonia, unspecified organism: Secondary | ICD-10-CM | POA: Diagnosis present

## 2014-07-19 DIAGNOSIS — E780 Pure hypercholesterolemia: Secondary | ICD-10-CM | POA: Diagnosis present

## 2014-07-19 DIAGNOSIS — Z880 Allergy status to penicillin: Secondary | ICD-10-CM

## 2014-07-19 DIAGNOSIS — E876 Hypokalemia: Secondary | ICD-10-CM | POA: Diagnosis present

## 2014-07-19 DIAGNOSIS — E785 Hyperlipidemia, unspecified: Secondary | ICD-10-CM | POA: Diagnosis present

## 2014-07-19 DIAGNOSIS — F329 Major depressive disorder, single episode, unspecified: Secondary | ICD-10-CM | POA: Diagnosis present

## 2014-07-19 DIAGNOSIS — I10 Essential (primary) hypertension: Secondary | ICD-10-CM | POA: Diagnosis present

## 2014-07-19 DIAGNOSIS — Z8614 Personal history of Methicillin resistant Staphylococcus aureus infection: Secondary | ICD-10-CM

## 2014-07-19 DIAGNOSIS — Z87440 Personal history of urinary (tract) infections: Secondary | ICD-10-CM

## 2014-07-19 DIAGNOSIS — G4730 Sleep apnea, unspecified: Secondary | ICD-10-CM | POA: Diagnosis present

## 2014-07-19 DIAGNOSIS — G9341 Metabolic encephalopathy: Secondary | ICD-10-CM | POA: Diagnosis present

## 2014-07-19 DIAGNOSIS — R410 Disorientation, unspecified: Secondary | ICD-10-CM

## 2014-07-19 DIAGNOSIS — E860 Dehydration: Secondary | ICD-10-CM | POA: Diagnosis present

## 2014-07-19 DIAGNOSIS — Z888 Allergy status to other drugs, medicaments and biological substances status: Secondary | ICD-10-CM

## 2014-07-19 DIAGNOSIS — N393 Stress incontinence (female) (male): Secondary | ICD-10-CM | POA: Diagnosis present

## 2014-07-19 DIAGNOSIS — Z8673 Personal history of transient ischemic attack (TIA), and cerebral infarction without residual deficits: Secondary | ICD-10-CM

## 2014-07-19 DIAGNOSIS — N3090 Cystitis, unspecified without hematuria: Secondary | ICD-10-CM | POA: Diagnosis present

## 2014-07-19 DIAGNOSIS — R001 Bradycardia, unspecified: Secondary | ICD-10-CM | POA: Diagnosis present

## 2014-07-19 DIAGNOSIS — Z79899 Other long term (current) drug therapy: Secondary | ICD-10-CM

## 2014-07-19 DIAGNOSIS — I2510 Atherosclerotic heart disease of native coronary artery without angina pectoris: Secondary | ICD-10-CM | POA: Diagnosis present

## 2014-07-19 HISTORY — DX: Pure hypercholesterolemia, unspecified: E78.00

## 2014-07-19 HISTORY — DX: Essential (primary) hypertension: I10

## 2014-07-19 HISTORY — DX: Major depressive disorder, single episode, unspecified: F32.9

## 2014-07-19 HISTORY — DX: Personal history of transient ischemic attack (TIA), and cerebral infarction without residual deficits: Z86.73

## 2014-07-19 HISTORY — DX: Depression, unspecified: F32.A

## 2014-07-19 HISTORY — DX: Unspecified staphylococcus as the cause of diseases classified elsewhere: B95.8

## 2014-07-19 HISTORY — DX: Pneumonia, unspecified organism: J18.9

## 2014-07-19 LAB — COMPREHENSIVE METABOLIC PANEL
ALT: 15 U/L (ref 14–54)
AST: 26 U/L (ref 15–41)
Albumin: 4.1 g/dL (ref 3.5–5.0)
Alkaline Phosphatase: 72 U/L (ref 38–126)
Anion gap: 11 (ref 5–15)
BUN: 24 mg/dL — AB (ref 6–20)
CALCIUM: 9.5 mg/dL (ref 8.9–10.3)
CHLORIDE: 104 mmol/L (ref 101–111)
CO2: 26 mmol/L (ref 22–32)
Creatinine, Ser: 1.12 mg/dL — ABNORMAL HIGH (ref 0.44–1.00)
GFR calc Af Amer: 51 mL/min — ABNORMAL LOW (ref >60)
GFR calc non Af Amer: 44 mL/min — ABNORMAL LOW (ref >60)
GLUCOSE: 139 mg/dL — AB (ref 65–99)
Potassium: 3.3 mmol/L — ABNORMAL LOW (ref 3.5–5.1)
Sodium: 141 mmol/L (ref 135–145)
TOTAL PROTEIN: 7.7 g/dL (ref 6.5–8.1)
Total Bilirubin: 0.4 mg/dL (ref 0.3–1.2)

## 2014-07-19 LAB — URINALYSIS COMPLETE WITH MICROSCOPIC (ARMC ONLY)
BACTERIA UA: NONE SEEN
BILIRUBIN URINE: NEGATIVE
Glucose, UA: 50 mg/dL — AB
HGB URINE DIPSTICK: NEGATIVE
KETONES UR: NEGATIVE mg/dL
Nitrite: NEGATIVE
SPECIFIC GRAVITY, URINE: 1.016 (ref 1.005–1.030)
pH: 6 (ref 5.0–8.0)

## 2014-07-19 LAB — CBC
HCT: 42.7 % (ref 35.0–47.0)
Hemoglobin: 13.8 g/dL (ref 12.0–16.0)
MCH: 26.7 pg (ref 26.0–34.0)
MCHC: 32.2 g/dL (ref 32.0–36.0)
MCV: 82.7 fL (ref 80.0–100.0)
Platelets: 179 10*3/uL (ref 150–440)
RBC: 5.16 MIL/uL (ref 3.80–5.20)
RDW: 15.6 % — ABNORMAL HIGH (ref 11.5–14.5)
WBC: 7.4 10*3/uL (ref 3.6–11.0)

## 2014-07-19 NOTE — ED Notes (Signed)
Per family patient with confusion that started yesterday and has become worse today. Patient was recently treated for a uti and finished the antibiotics Wednesday. Family states that the patient was confused when she had the uti but was back to her baseline wed and Thursday when she completed the antibiotics.

## 2014-07-20 ENCOUNTER — Emergency Department: Payer: Medicare Other

## 2014-07-20 DIAGNOSIS — E876 Hypokalemia: Secondary | ICD-10-CM | POA: Diagnosis present

## 2014-07-20 DIAGNOSIS — J189 Pneumonia, unspecified organism: Secondary | ICD-10-CM | POA: Diagnosis present

## 2014-07-20 DIAGNOSIS — Z882 Allergy status to sulfonamides status: Secondary | ICD-10-CM | POA: Diagnosis not present

## 2014-07-20 DIAGNOSIS — Z7902 Long term (current) use of antithrombotics/antiplatelets: Secondary | ICD-10-CM | POA: Diagnosis not present

## 2014-07-20 DIAGNOSIS — Z888 Allergy status to other drugs, medicaments and biological substances status: Secondary | ICD-10-CM | POA: Diagnosis not present

## 2014-07-20 DIAGNOSIS — Z880 Allergy status to penicillin: Secondary | ICD-10-CM | POA: Diagnosis not present

## 2014-07-20 DIAGNOSIS — Z8673 Personal history of transient ischemic attack (TIA), and cerebral infarction without residual deficits: Secondary | ICD-10-CM | POA: Diagnosis not present

## 2014-07-20 DIAGNOSIS — I10 Essential (primary) hypertension: Secondary | ICD-10-CM | POA: Diagnosis present

## 2014-07-20 DIAGNOSIS — I2510 Atherosclerotic heart disease of native coronary artery without angina pectoris: Secondary | ICD-10-CM | POA: Diagnosis present

## 2014-07-20 DIAGNOSIS — N393 Stress incontinence (female) (male): Secondary | ICD-10-CM | POA: Diagnosis present

## 2014-07-20 DIAGNOSIS — Z8614 Personal history of Methicillin resistant Staphylococcus aureus infection: Secondary | ICD-10-CM | POA: Diagnosis not present

## 2014-07-20 DIAGNOSIS — Z79899 Other long term (current) drug therapy: Secondary | ICD-10-CM | POA: Diagnosis not present

## 2014-07-20 DIAGNOSIS — G4730 Sleep apnea, unspecified: Secondary | ICD-10-CM | POA: Diagnosis present

## 2014-07-20 DIAGNOSIS — R109 Unspecified abdominal pain: Secondary | ICD-10-CM | POA: Diagnosis present

## 2014-07-20 DIAGNOSIS — F329 Major depressive disorder, single episode, unspecified: Secondary | ICD-10-CM | POA: Diagnosis present

## 2014-07-20 DIAGNOSIS — E785 Hyperlipidemia, unspecified: Secondary | ICD-10-CM | POA: Diagnosis present

## 2014-07-20 DIAGNOSIS — R001 Bradycardia, unspecified: Secondary | ICD-10-CM | POA: Diagnosis present

## 2014-07-20 DIAGNOSIS — E780 Pure hypercholesterolemia: Secondary | ICD-10-CM | POA: Diagnosis present

## 2014-07-20 DIAGNOSIS — E860 Dehydration: Secondary | ICD-10-CM | POA: Diagnosis present

## 2014-07-20 DIAGNOSIS — Z87440 Personal history of urinary (tract) infections: Secondary | ICD-10-CM | POA: Diagnosis not present

## 2014-07-20 DIAGNOSIS — Z87891 Personal history of nicotine dependence: Secondary | ICD-10-CM | POA: Diagnosis not present

## 2014-07-20 DIAGNOSIS — G9341 Metabolic encephalopathy: Secondary | ICD-10-CM | POA: Diagnosis present

## 2014-07-20 DIAGNOSIS — N3090 Cystitis, unspecified without hematuria: Secondary | ICD-10-CM | POA: Diagnosis present

## 2014-07-20 LAB — TSH: TSH: 3.503 u[IU]/mL (ref 0.350–4.500)

## 2014-07-20 MED ORDER — HYDRALAZINE HCL 50 MG PO TABS
50.0000 mg | ORAL_TABLET | Freq: Three times a day (TID) | ORAL | Status: DC
  Administered 2014-07-20 – 2014-07-23 (×10): 50 mg via ORAL
  Filled 2014-07-20 (×10): qty 1

## 2014-07-20 MED ORDER — LOSARTAN POTASSIUM 50 MG PO TABS
100.0000 mg | ORAL_TABLET | Freq: Every day | ORAL | Status: DC
  Administered 2014-07-20 – 2014-07-23 (×4): 100 mg via ORAL
  Filled 2014-07-20 (×4): qty 2

## 2014-07-20 MED ORDER — ONDANSETRON HCL 4 MG/2ML IJ SOLN: 4.0000 mg | Freq: Four times a day (QID) | INTRAMUSCULAR | Status: DC | PRN

## 2014-07-20 MED ORDER — ATENOLOL 50 MG PO TABS
25.0000 mg | ORAL_TABLET | Freq: Every day | ORAL | Status: DC
  Administered 2014-07-20 – 2014-07-21 (×2): 25 mg via ORAL
  Filled 2014-07-20: qty 1
  Filled 2014-07-20: qty 2

## 2014-07-20 MED ORDER — VITAMIN D 1000 UNITS PO TABS
1000.0000 [IU] | ORAL_TABLET | Freq: Every day | ORAL | Status: DC
  Administered 2014-07-20 – 2014-07-23 (×4): 1000 [IU] via ORAL
  Filled 2014-07-20 (×4): qty 1

## 2014-07-20 MED ORDER — AZITHROMYCIN 500 MG IV SOLR
INTRAVENOUS | Status: AC
  Administered 2014-07-20: 500 mg via INTRAVENOUS
  Filled 2014-07-20: qty 500

## 2014-07-20 MED ORDER — DIAZEPAM 5 MG PO TABS: 5.0000 mg | ORAL_TABLET | Freq: Four times a day (QID) | ORAL | Status: DC | PRN

## 2014-07-20 MED ORDER — CLOPIDOGREL BISULFATE 75 MG PO TABS
75.0000 mg | ORAL_TABLET | Freq: Every day | ORAL | Status: DC
  Administered 2014-07-20 – 2014-07-23 (×4): 75 mg via ORAL
  Filled 2014-07-20 (×4): qty 1

## 2014-07-20 MED ORDER — AMLODIPINE BESYLATE 5 MG PO TABS
5.0000 mg | ORAL_TABLET | Freq: Every day | ORAL | Status: DC
  Administered 2014-07-20 – 2014-07-23 (×4): 5 mg via ORAL
  Filled 2014-07-20 (×4): qty 1

## 2014-07-20 MED ORDER — IOHEXOL 300 MG/ML  SOLN
80.0000 mL | Freq: Once | INTRAMUSCULAR | Status: AC | PRN
  Administered 2014-07-20: 80 mL via INTRAVENOUS

## 2014-07-20 MED ORDER — ALUM & MAG HYDROXIDE-SIMETH 200-200-20 MG/5ML PO SUSP
30.0000 mL | Freq: Four times a day (QID) | ORAL | Status: DC | PRN
  Administered 2014-07-20: 30 mL via ORAL
  Filled 2014-07-20: qty 30

## 2014-07-20 MED ORDER — ACETAMINOPHEN 325 MG PO TABS
650.0000 mg | ORAL_TABLET | Freq: Four times a day (QID) | ORAL | Status: DC | PRN
  Administered 2014-07-21 – 2014-07-22 (×3): 650 mg via ORAL
  Filled 2014-07-20 (×3): qty 2

## 2014-07-20 MED ORDER — CEFTRIAXONE SODIUM IN DEXTROSE 20 MG/ML IV SOLN
1.0000 g | Freq: Once | INTRAVENOUS | Status: AC
  Administered 2014-07-20: 1 g via INTRAVENOUS

## 2014-07-20 MED ORDER — OXYBUTYNIN CHLORIDE ER 5 MG PO TB24
5.0000 mg | ORAL_TABLET | Freq: Every day | ORAL | Status: DC
  Administered 2014-07-20 – 2014-07-22 (×3): 5 mg via ORAL
  Filled 2014-07-20 (×4): qty 1

## 2014-07-20 MED ORDER — SERTRALINE HCL 50 MG PO TABS
100.0000 mg | ORAL_TABLET | Freq: Every day | ORAL | Status: DC
  Administered 2014-07-20 – 2014-07-23 (×4): 100 mg via ORAL
  Filled 2014-07-20 (×4): qty 2

## 2014-07-20 MED ORDER — SODIUM CHLORIDE 0.9 % IJ SOLN
3.0000 mL | Freq: Two times a day (BID) | INTRAMUSCULAR | Status: DC
  Administered 2014-07-20 – 2014-07-23 (×6): 3 mL via INTRAVENOUS

## 2014-07-20 MED ORDER — POTASSIUM CHLORIDE IN NACL 20-0.9 MEQ/L-% IV SOLN
INTRAVENOUS | Status: DC
  Administered 2014-07-20: 07:00:00 via INTRAVENOUS
  Filled 2014-07-20 (×3): qty 1000

## 2014-07-20 MED ORDER — ACETAMINOPHEN 650 MG RE SUPP: 650.0000 mg | Freq: Four times a day (QID) | RECTAL | Status: DC | PRN

## 2014-07-20 MED ORDER — SODIUM CHLORIDE 0.9 % IV BOLUS (SEPSIS)
1000.0000 mL | Freq: Once | INTRAVENOUS | Status: AC
  Administered 2014-07-20: 1000 mL via INTRAVENOUS

## 2014-07-20 MED ORDER — ONDANSETRON HCL 4 MG PO TABS: 4.0000 mg | ORAL_TABLET | Freq: Four times a day (QID) | ORAL | Status: DC | PRN

## 2014-07-20 MED ORDER — PRAVASTATIN SODIUM 20 MG PO TABS
20.0000 mg | ORAL_TABLET | Freq: Every day | ORAL | Status: DC
  Administered 2014-07-20 – 2014-07-23 (×4): 20 mg via ORAL
  Filled 2014-07-20 (×4): qty 1

## 2014-07-20 MED ORDER — VITAMIN D-3 25 MCG (1000 UT) PO CAPS: 1.0000 | ORAL_CAPSULE | Freq: Every day | ORAL | Status: DC

## 2014-07-20 MED ORDER — DOCUSATE SODIUM 100 MG PO CAPS
100.0000 mg | ORAL_CAPSULE | Freq: Two times a day (BID) | ORAL | Status: DC
  Administered 2014-07-20 – 2014-07-21 (×3): 100 mg via ORAL
  Filled 2014-07-20 (×5): qty 1

## 2014-07-20 MED ORDER — ARIPIPRAZOLE 2 MG PO TABS
2.0000 mg | ORAL_TABLET | Freq: Every day | ORAL | Status: DC
  Administered 2014-07-20 – 2014-07-23 (×4): 2 mg via ORAL
  Filled 2014-07-20 (×4): qty 1

## 2014-07-20 MED ORDER — AZITHROMYCIN 500 MG IV SOLR
500.0000 mg | Freq: Once | INTRAVENOUS | Status: AC
  Administered 2014-07-20: 500 mg via INTRAVENOUS

## 2014-07-20 MED ORDER — CALCIUM CARBONATE ANTACID 500 MG PO CHEW
1.0000 | CHEWABLE_TABLET | Freq: Three times a day (TID) | ORAL | Status: DC | PRN
  Administered 2014-07-20: 200 mg via ORAL
  Filled 2014-07-20: qty 1

## 2014-07-20 MED ORDER — CEFTRIAXONE SODIUM IN DEXTROSE 20 MG/ML IV SOLN
INTRAVENOUS | Status: AC
  Administered 2014-07-20: 1 g via INTRAVENOUS
  Filled 2014-07-20: qty 50

## 2014-07-20 MED ORDER — DEXTROSE 5 % IV SOLN
500.0000 mg | INTRAVENOUS | Status: DC
  Administered 2014-07-21: 09:00:00 500 mg via INTRAVENOUS
  Filled 2014-07-20 (×2): qty 500

## 2014-07-20 MED ORDER — HEPARIN SODIUM (PORCINE) 5000 UNIT/ML IJ SOLN
5000.0000 [IU] | Freq: Three times a day (TID) | INTRAMUSCULAR | Status: DC
  Administered 2014-07-20 – 2014-07-23 (×10): 5000 [IU] via SUBCUTANEOUS
  Filled 2014-07-20 (×10): qty 1

## 2014-07-20 MED ORDER — CEFTRIAXONE SODIUM IN DEXTROSE 20 MG/ML IV SOLN
1.0000 g | INTRAVENOUS | Status: DC
  Administered 2014-07-21 – 2014-07-23 (×3): 1 g via INTRAVENOUS
  Filled 2014-07-20 (×4): qty 50

## 2014-07-20 MED ORDER — IOHEXOL 240 MG/ML SOLN
25.0000 mL | Freq: Once | INTRAMUSCULAR | Status: AC | PRN
  Administered 2014-07-20: 25 mL via ORAL

## 2014-07-20 NOTE — H&P (Signed)
Martha Hunter is an 81 y.o. female.   Chief Complaint: Confusion HPI: The patient presents to the emergency department via private vehicle complaining of confusion. She finished a course of ciprofloxacin for urinary tract infection 3 days ago. Since that time she has had a cough that is progressively worse area did in the emergency department the patient complained of some left flank pain. A CT scan of the abdomen and pelvis was obtained to rule out kidney stone. Imaging did not show nephrolithiasis that revealed a left lower lobe pneumonia. The patient does not required supplemental oxygen and her confusion is mildly improved but due to her risk factors the emergency department staff called for admission.  Past Medical History  Diagnosis Date  . Hypertension   . Hypercholesteremia   . Depression   . Staph infection     Past Surgical History  Procedure Laterality Date  . None      History reviewed. No pertinent family history. Social History:  reports that she has quit smoking. She does not have any smokeless tobacco history on file. She reports that she does not drink alcohol or use illicit drugs.  Allergies:  Allergies  Allergen Reactions  . Nitrates, Organic   . Penicillins   . Sulfa Antibiotics     Prior to Admission medications   Medication Sig Start Date End Date Taking? Authorizing Provider  amLODipine (NORVASC) 5 MG tablet Take 5 mg by mouth daily.   Yes Historical Provider, MD  ARIPiprazole (ABILIFY) 2 MG tablet Take 2 mg by mouth daily.   Yes Historical Provider, MD  atenolol (TENORMIN) 25 MG tablet Take 25 mg by mouth daily.   Yes Historical Provider, MD  Cholecalciferol (VITAMIN D-3) 1000 UNITS CAPS Take by mouth.   Yes Historical Provider, MD  clopidogrel (PLAVIX) 75 MG tablet Take 75 mg by mouth daily.   Yes Historical Provider, MD  diazepam (VALIUM) 5 MG tablet Take 5 mg by mouth every 6 (six) hours as needed for anxiety.   Yes Historical Provider, MD  hydrALAZINE  (APRESOLINE) 50 MG tablet Take 50 mg by mouth 3 (three) times daily.   Yes Historical Provider, MD  losartan (COZAAR) 100 MG tablet Take 100 mg by mouth daily.   Yes Historical Provider, MD  oxybutynin (DITROPAN-XL) 5 MG 24 hr tablet Take 5 mg by mouth at bedtime.   Yes Historical Provider, MD  pravastatin (PRAVACHOL) 20 MG tablet Take 20 mg by mouth daily.   Yes Historical Provider, MD  Probiotic Product (PROBIOTIC DAILY PO) Take 1 tablet by mouth every morning.   Yes Historical Provider, MD  sertraline (ZOLOFT) 100 MG tablet Take 100 mg by mouth daily.   Yes Historical Provider, MD     Results for orders placed or performed during the hospital encounter of 07/19/14 (from the past 48 hour(s))  CBC     Status: Abnormal   Collection Time: 07/19/14 10:57 PM  Result Value Ref Range   WBC 7.4 3.6 - 11.0 K/uL   RBC 5.16 3.80 - 5.20 MIL/uL   Hemoglobin 13.8 12.0 - 16.0 g/dL   HCT 42.7 35.0 - 47.0 %   MCV 82.7 80.0 - 100.0 fL   MCH 26.7 26.0 - 34.0 pg   MCHC 32.2 32.0 - 36.0 g/dL   RDW 15.6 (H) 11.5 - 14.5 %   Platelets 179 150 - 440 K/uL  Comprehensive metabolic panel     Status: Abnormal   Collection Time: 07/19/14 10:57 PM  Result Value Ref  Range   Sodium 141 135 - 145 mmol/L   Potassium 3.3 (L) 3.5 - 5.1 mmol/L   Chloride 104 101 - 111 mmol/L   CO2 26 22 - 32 mmol/L   Glucose, Bld 139 (H) 65 - 99 mg/dL   BUN 24 (H) 6 - 20 mg/dL   Creatinine, Ser 1.12 (H) 0.44 - 1.00 mg/dL   Calcium 9.5 8.9 - 10.3 mg/dL   Total Protein 7.7 6.5 - 8.1 g/dL   Albumin 4.1 3.5 - 5.0 g/dL   AST 26 15 - 41 U/L   ALT 15 14 - 54 U/L   Alkaline Phosphatase 72 38 - 126 U/L   Total Bilirubin 0.4 0.3 - 1.2 mg/dL   GFR calc non Af Amer 44 (L) >60 mL/min   GFR calc Af Amer 51 (L) >60 mL/min    Comment: (NOTE) The eGFR has been calculated using the CKD EPI equation. This calculation has not been validated in all clinical situations. eGFR's persistently <60 mL/min signify possible Chronic Kidney Disease.     Anion gap 11 5 - 15  Urinalysis complete, with microscopic [facility])     Status: Abnormal   Collection Time: 07/19/14 10:57 PM  Result Value Ref Range   Color, Urine YELLOW (A) YELLOW   APPearance CLEAR (A) CLEAR   Glucose, UA 50 (A) NEGATIVE mg/dL   Bilirubin Urine NEGATIVE NEGATIVE   Ketones, ur NEGATIVE NEGATIVE mg/dL   Specific Gravity, Urine 1.016 1.005 - 1.030   Hgb urine dipstick NEGATIVE NEGATIVE   pH 6.0 5.0 - 8.0   Protein, ur >500 (A) NEGATIVE mg/dL   Nitrite NEGATIVE NEGATIVE   Leukocytes, UA 2+ (A) NEGATIVE   RBC / HPF 0-5 0 - 5 RBC/hpf   WBC, UA TOO NUMEROUS TO COUNT 0 - 5 WBC/hpf   Bacteria, UA NONE SEEN NONE SEEN   Squamous Epithelial / LPF 0-5 (A) NONE SEEN   Mucous PRESENT    Ct Abdomen Pelvis W Contrast  07/20/2014   CLINICAL DATA:  Acute onset of left lower quadrant abdominal tenderness. Initial encounter.  EXAM: CT ABDOMEN AND PELVIS WITH CONTRAST  TECHNIQUE: Multidetector CT imaging of the abdomen and pelvis was performed using the standard protocol following bolus administration of intravenous contrast.  CONTRAST:  66m OMNIPAQUE IOHEXOL 300 MG/ML  SOLN  COMPARISON:  CT of the abdomen and pelvis from 06/29/2010  FINDINGS: Dense airspace opacification is noted within the left lower lobe, concerning for pneumonia.  The liver and spleen are unremarkable in appearance. Minimal stones are noted dependently within the gallbladder. The gallbladder is otherwise unremarkable. The pancreas and adrenal glands are unremarkable.  Scattered bilateral renal cysts are seen, measuring up to 5.4 cm in size. Mild bilateral renal pelvicaliectasis remains within normal limits, without evidence of hydronephrosis. No renal or ureteral stones are identified. Mild nonspecific perinephric stranding is noted bilaterally.  No free fluid is identified. The small bowel is unremarkable in appearance. The stomach is within normal limits. No acute vascular abnormalities are seen. Diffuse calcification is  noted along the abdominal aorta and its branches.  There is mild focal aneurysmal dilatation of the proximal abdominal aorta at the level of the diaphragm, measuring up to 3.3 cm in diameter. This resolves above the level of the celiac trunk.  The appendix is not fully characterized but appears grossly unremarkable, without evidence of appendicitis. The colon is partially filled with stool. Scattered diverticulosis is noted along the distal descending and sigmoid colon, without evidence of diverticulitis.  The bladder is moderately distended and grossly unremarkable. The uterus is unremarkable in appearance. The ovaries are relatively symmetric. No suspicious adnexal masses are seen. No inguinal lymphadenopathy is seen.  No acute osseous abnormalities are identified.  IMPRESSION: 1. Dense airspace opacification within the left lower lung lobe is concerning for pneumonia. 2. Cholelithiasis; gallbladder otherwise unremarkable. 3. Scattered bilateral renal cysts noted. 4. Diffuse calcification along the abdominal aorta and its branches. 5. 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 This resolves above the level of the celiac trunk. Recommend followup by ultrasound in 3 years. This recommendation follows ACR consensus guidelines: White Paper of the ACR Incidental Findings Committee II on Vascular Findings. J Am Coll Radiol 2013; 69:629-528 6. Scattered diverticulosis along the distal descending and sigmoid colon, without evidence of diverticulitis.   Electronically Signed   By: Garald Balding M.D.   On: 07/20/2014 03:23   Dg Chest Port 1 View  07/20/2014   CLINICAL DATA:  Acute onset of confusion. Cough. Initial encounter.  EXAM: PORTABLE CHEST - 1 VIEW  COMPARISON:  Chest radiograph and CTA of the chest performed 07/24/2013  FINDINGS: The lungs are well-aerated. Mild peribronchial thickening is noted. Minimal left basilar opacity  likely reflects atelectasis. There is no evidence of pleural effusion or pneumothorax.  The cardiomediastinal silhouette is borderline enlarged. No acute osseous abnormalities are seen.  IMPRESSION: Mild peribronchial thickening noted. Minimal left basilar opacity likely reflects atelectasis. Borderline cardiomegaly.   Electronically Signed   By: Garald Balding M.D.   On: 07/20/2014 04:21    Review of Systems  Constitutional: Negative for fever and chills.  HENT: Negative for sore throat and tinnitus.   Eyes: Negative for blurred vision and redness.  Respiratory: Positive for cough and shortness of breath (Intermittent).   Cardiovascular: Negative for chest pain, palpitations, orthopnea and PND.  Gastrointestinal: Negative for nausea, vomiting, abdominal pain and diarrhea.  Genitourinary: Negative for dysuria, urgency and frequency.  Musculoskeletal: Negative for myalgias and joint pain.  Skin: Negative for rash.       No lesions  Neurological: Negative for speech change, focal weakness and weakness.  Endo/Heme/Allergies: Does not bruise/bleed easily.       No temperature intolerance  Psychiatric/Behavioral: Negative for depression and suicidal ideas.    Blood pressure 167/74, pulse 62, temperature 98.3 F (36.8 C), temperature source Oral, resp. rate 20, height 5' (1.524 m), weight 65.772 kg (145 lb), SpO2 95 %. Physical Exam  Vitals reviewed. Constitutional: She is oriented to person, place, and time. She appears well-developed and well-nourished.  HENT:  Head: Normocephalic and atraumatic.  Mouth/Throat: Mucous membranes are dry.  Eyes: EOM are normal. Pupils are equal, round, and reactive to light.  Neck: Normal range of motion. No tracheal deviation present. No thyromegaly present.  Cardiovascular: Normal rate, regular rhythm and normal heart sounds.  Exam reveals no gallop and no friction rub.   No murmur heard. Respiratory: Effort normal and breath sounds normal.  GI: Soft.  Bowel sounds are normal. She exhibits no distension. There is no tenderness.  Lymphadenopathy:    She has no cervical adenopathy.  Neurological: She is alert and oriented to person, place, and time. No cranial nerve deficit. She exhibits normal muscle tone.  Mildly confused  Skin: Skin is warm and dry.  Psychiatric: She has a normal mood and affect. Judgment and thought content normal.     Assessment/Plan This is  an 81 year old Caucasian female admitted for community acquired pneumonia. 1. Pneumonia: The patient has no hypoxia at this time. She is afebrile without symptoms of sepsis. She is received ceftriaxone and azithromycin in the emergency department which we will continue. We'll also gently hydrate her and replete her potassium which should help with her confusion. We will supply supplemental oxygen as needed. Curb 65 score is 3. Pneumonia severity index > 90.  2. Hypertension: Uncontrolled ; continue amlodipine and valsartan as well as hydralazine and atenolol. 3. Coronary artery disease: Stable. Continue Plavix 4. Hyperlipidemia: continue statin therapy 5. Depression: Continue Zoloft and Abilify 6. Urinary stress incontinence: Continue oxybutynin 7. DVT prophylaxis: Heparin 8. GI prophylaxis: None The patient is a full code. Time spent on admission orders and patient care approximately 35 minutes  Harrie Foreman 07/20/2014, 4:49 AM

## 2014-07-20 NOTE — ED Notes (Signed)
Pt to CT

## 2014-07-20 NOTE — ED Notes (Signed)
Pt urinated on pants, brief and sheets. Pt cleaned, sheets changed, new brief applied.

## 2014-07-20 NOTE — ED Provider Notes (Signed)
[facility] Emergency Department Provider Note  ____________________________________________  Time seen: 11:50 PM  I have reviewed the triage vital signs and the nursing notes.   HISTORY  Chief Complaint Altered Mental Status  history obtained from patient and son   HPI Martha Hunter is a 81 y.o. female is brought to the ED for waxing and waning level of consciousness. At sometime she appears to be totally lucid and coherent and other times she will not recognize family members and be confused about her location and time. No fever at home but the patient has had a raspy cough for the last 2-3 days. She also was treated for urinary tract infection by her primary care doctor 2 weeks ago with Cipro. They note that at the conclusion of the course of Cipro she seemed much better, and as soon as the antibiotics were stopped, she seemed to get much worse again. She's been worse for 2 days now. Patient denies chest pain headache loss of consciousness. No vomiting or diarrhea. No fever.     Past Medical History  Diagnosis Date  . Hypertension     There are no active problems to display for this patient.   No past surgical history on file.  No current outpatient prescriptions on file.  Allergies Nitrates, organic; Penicillins; and Sulfa antibiotics  No family history on file.  Social History History  Substance Use Topics  . Smoking status: Former Games developer  . Smokeless tobacco: Not on file  . Alcohol Use: No    Review of Systems  Constitutional: No fever or chills. No weight changes Eyes:No blurry vision or double vision.  ENT: No sore throat. Cardiovascular: No chest pain. Respiratory: Nonproductive cough. Gastrointestinal: Negative for abdominal pain, vomiting and diarrhea.  No BRBPR or melena. Genitourinary: Urinary frequency Musculoskeletal: Negative for back pain. No joint swelling or pain. Skin: Negative for rash. Neurological: Negative for  headaches, focal weakness or numbness. Psychiatric:No anxiety or depression.   Endocrine:No hot/cold intolerance, changes in energy, or sleep difficulty.  10-point ROS otherwise negative.  ____________________________________________   PHYSICAL EXAM:  VITAL SIGNS: ED Triage Vitals  Enc Vitals Group     BP 07/19/14 2249 171/71 mmHg     Pulse Rate 07/19/14 2249 71     Resp 07/19/14 2249 18     Temp 07/19/14 2249 98.3 F (36.8 C)     Temp Source 07/19/14 2249 Oral     SpO2 07/19/14 2249 96 %     Weight 07/19/14 2249 145 lb (65.772 kg)     Height 07/19/14 2249 5' (1.524 m)     Head Cir --      Peak Flow --      Pain Score --      Pain Loc --      Pain Edu? --      Excl. in GC? --      Constitutional: Alert and oriented to self. Mild distress. Eyes: No scleral icterus. No conjunctival pallor. PERRL. EOMI ENT   Head: Normocephalic and atraumatic.   Nose: No congestion/rhinnorhea. No septal hematoma   Mouth/Throat: Dry mucous membranes, no pharyngeal erythema. No peritonsillar mass. No uvula shift.   Neck: No stridor. No SubQ emphysema. No meningismus. Hematological/Lymphatic/Immunilogical: No cervical lymphadenopathy. Cardiovascular: RRR. Normal and symmetric distal pulses are present in all extremities. No murmurs, rubs, or gallops. Respiratory: Normal respiratory effort without tachypnea nor retractions. Breath sounds are clear and equal bilaterally. No wheezes/rales/rhonchi. Gastrointestinal: Suprapubic and left lower quadrant tenderness .  No distention. There is no CVA tenderness.  No rebound, rigidity, or guarding. Genitourinary: deferred Musculoskeletal: Nontender with normal range of motion in all extremities. No joint effusions.  No lower extremity tenderness.  No edema. Neurologic:   Normal speech and language.  CN 2-10 normal. Motor grossly intact. No pronator drift.  Normal gait. No gross focal neurologic deficits are appreciated.  Skin:  Skin is  warm, dry and intact. No rash noted.  No petechiae, purpura, or bullae. Psychiatric: Mood and affect are normal. Speech and behavior are normal. Patient exhibits appropriate insight and judgment.  ____________________________________________    LABS (pertinent positives/negatives) (all labs ordered are listed, but only abnormal results are displayed) Labs Reviewed  CBC - Abnormal; Notable for the following:    RDW 15.6 (*)    All other components within normal limits  COMPREHENSIVE METABOLIC PANEL - Abnormal; Notable for the following:    Potassium 3.3 (*)    Glucose, Bld 139 (*)    BUN 24 (*)    Creatinine, Ser 1.12 (*)    GFR calc non Af Amer 44 (*)    GFR calc Af Amer 51 (*)    All other components within normal limits  URINALYSIS COMPLETEWITH MICROSCOPIC ([facility] ONLY) - Abnormal; Notable for the following:    Color, Urine YELLOW (*)    APPearance CLEAR (*)    Glucose, UA 50 (*)    Protein, ur >500 (*)    Leukocytes, UA 2+ (*)    Squamous Epithelial / LPF 0-5 (*)    All other components within normal limits  CBG MONITORING, ED   urine white blood cell too numerous to count ____________________________________________   EKG  Interpreted by me. Normal sinus rhythm rate of 63, left axis, normal intervals, normal QRS, ST segments and T waves  ____________________________________________    RADIOLOGY  CT abdomen and pelvis reveals left lower lobe pneumonia without significant intra-abdominal pathology  ____________________________________________   PROCEDURES  ____________________________________________   INITIAL IMPRESSION / ASSESSMENT AND PLAN / ED COURSE  Pertinent labs & imaging results that were available during my care of the patient were reviewed by me and considered in my medical decision making (see chart for details).  Patient presents with community-acquired pneumonia as well as urinary tract infection that is associated with outpatient treatment  failure. In the setting of these 2 infections, the patient is also experiencing delirium at home. Vital signs are unremarkable and she is not septic, but due to age and delirium with risk of decompensation, we'll plan to admit her to the hospital for further monitoring of her condition with IV antibiotics.  ____________________________________________   FINAL CLINICAL IMPRESSION(S) / ED DIAGNOSES  Final diagnoses:  Community acquired pneumonia  Delirium  Cystitis      Sharman CheekPhillip Whitnee Orzel, MD 07/20/14 [phone]

## 2014-07-20 NOTE — Plan of Care (Signed)
Problem: Discharge Progression Outcomes Goal: Other Discharge Outcomes/Goals Outcome: Not Progressing Patient admitted early this morning for AMS and pneumonia.  Patient more alert and oriented this shift, tolerated diet, vss will continue to follow up

## 2014-07-20 NOTE — ED Notes (Signed)
Report given to Melissa RN

## 2014-07-20 NOTE — Progress Notes (Signed)
Dr. Sheryle Hailiamond notified of patients elevated BP 181/57 HR 56 on arrival to room 109. Order obtained to give scheduled BP medications now.

## 2014-07-20 NOTE — Progress Notes (Signed)
[facility] - [place] at [facility]   PATIENT NAME: Martha Hunter    MR#:  [id number]  DATE OF BIRTH:  June 09, 1928  SUBJECTIVE: 81 year old female patient admitted for confusion, cough, pneumonia, hypokalemia, dehydration. Patient feels better today much more alert and oriented. Daughter at bedside ,patient does have some cough but no other complaints.   CHIEF COMPLAINT:   Chief Complaint  Patient presents with  . Altered Mental Status    REVIEW OF SYSTEMS:   Review of Systems  Constitutional: Negative for fever.  Respiratory: Positive for cough.   Gastrointestinal: Negative for heartburn.  Genitourinary: Negative for dysuria.  Musculoskeletal: Negative for myalgias.  Neurological: Negative for dizziness.  Endo/Heme/Allergies: Does not bruise/bleed easily.   CONSTITUTIONAL: No fever, fatigue or weakness.  EYES: No blurred or double vision.  EARS, NOSE, AND THROAT: No tinnitus or ear pain.  RESPIRATORY: No cough, shortness of breath, wheezing or hemoptysis.  CARDIOVASCULAR: No chest pain, orthopnea, edema.  GASTROINTESTINAL: No nausea, vomiting, diarrhea or abdominal pain.  GENITOURINARY: No dysuria, hematuria.  ENDOCRINE: No polyuria, nocturia,  HEMATOLOGY: No anemia, easy bruising or bleeding SKIN: No rash or lesion. MUSCULOSKELETAL: No joint pain or arthritis.   NEUROLOGIC: No tingling, numbness, weakness.  PSYCHIATRY: No anxiety or depression.   DRUG ALLERGIES:   Allergies  Allergen Reactions  . Nitrates, Organic Other (See Comments)    Reaction: unknown  . Penicillins Other (See Comments)    Reaction: unknown  . Sulfa Antibiotics Other (See Comments)    Reaction: unknown     VITALS:  Blood pressure 157/54, pulse 54, temperature 97.8 F (36.6 C), temperature source Oral, resp. rate 20, height 5' (1.524 m), weight 71.396 kg (157 lb 6.4 oz), SpO2 94 %.  PHYSICAL EXAMINATION:  GENERAL:  81 y.o.-year-old patient lying in the bed with no  acute distress.  EYES: Pupils equal, round, reactive to light and accommodation. No scleral icterus. Extraocular muscles intact.  HEENT: Head atraumatic, normocephalic. Oropharynx and nasopharynx clear.  NECK:  Supple, no jugular venous distention. No thyroid enlargement, no tenderness.  LUNGS: Normal breath sounds bilaterally, no wheezing, rales,rhonchi or crepitation. No use of accessory muscles of respiration.  CARDIOVASCULAR: S1, S2 normal. No murmurs, rubs, or gallops.  ABDOMEN: Soft, nontender, nondistended. Bowel sounds present. No organomegaly or mass.  EXTREMITIES: No pedal edema, cyanosis, or clubbing.  NEUROLOGIC: Cranial nerves II through XII are intact. Muscle strength 5/5 in all extremities. Sensation intact. Gait not checked.  PSYCHIATRIC: The patient is alert and oriented x 3.  SKIN: No obvious rash, lesion, or ulcer.    LABORATORY PANEL:   CBC  Recent Labs Lab 07/19/14 2257  WBC 7.4  HGB 13.8  HCT 42.7  PLT 179   ------------------------------------------------------------------------------------------------------------------  Chemistries   Recent Labs Lab 07/19/14 2257  NA 141  K 3.3*  CL 104  CO2 26  GLUCOSE 139*  BUN 24*  CREATININE 1.12*  CALCIUM 9.5  AST 26  ALT 15  ALKPHOS 72  BILITOT 0.4   ------------------------------------------------------------------------------------------------------------------  Cardiac Enzymes No results for input(s): TROPONINI in the last 168 hours. ------------------------------------------------------------------------------------------------------------------  RADIOLOGY:  Ct Abdomen Pelvis W Contrast  07/20/2014   CLINICAL DATA:  Acute onset of left lower quadrant abdominal tenderness. Initial encounter.  EXAM: CT ABDOMEN AND PELVIS WITH CONTRAST  TECHNIQUE: Multidetector CT imaging of the abdomen and pelvis was performed using the standard protocol following bolus administration of intravenous contrast.   CONTRAST:  80mL OMNIPAQUE IOHEXOL 300 MG/ML  SOLN  COMPARISON:  CT of the abdomen and pelvis from 06/29/2010  FINDINGS: Dense airspace opacification is noted within the left lower lobe, concerning for pneumonia.  The liver and spleen are unremarkable in appearance. Minimal stones are noted dependently within the gallbladder. The gallbladder is otherwise unremarkable. The pancreas and adrenal glands are unremarkable.  Scattered bilateral renal cysts are seen, measuring up to 5.4 cm in size. Mild bilateral renal pelvicaliectasis remains within normal limits, without evidence of hydronephrosis. No renal or ureteral stones are identified. Mild nonspecific perinephric stranding is noted bilaterally.  No free fluid is identified. The small bowel is unremarkable in appearance. The stomach is within normal limits. No acute vascular abnormalities are seen. Diffuse calcification is noted along the abdominal aorta and its branches.  There is mild focal aneurysmal dilatation of the proximal abdominal aorta at the level of the diaphragm, measuring up to 3.3 cm in diameter. This resolves above the level of the celiac trunk.  The appendix is not fully characterized but appears grossly unremarkable, without evidence of appendicitis. The colon is partially filled with stool. Scattered diverticulosis is noted along the distal descending and sigmoid colon, without evidence of diverticulitis.  The bladder is moderately distended and grossly unremarkable. The uterus is unremarkable in appearance. The ovaries are relatively symmetric. No suspicious adnexal masses are seen. No inguinal lymphadenopathy is seen.  No acute osseous abnormalities are identified.  IMPRESSION: 1. Dense airspace opacification within the left lower lung lobe is concerning for pneumonia. 2. Cholelithiasis; gallbladder otherwise unremarkable. 3. Scattered bilateral renal cysts noted. 4. Diffuse calcification along the abdominal aorta and its branches. 5. Mild focal  aneurysmal dilatation of the proximal abdominal aorta at the level of the diaphragm, measuring up to 3.3 cm in diameter. This resolves above the level of the celiac trunk. Recommend followup by ultrasound in 3 years. This recommendation follows ACR consensus guidelines: White Paper of the ACR Incidental Findings Committee II on Vascular Findings. J Am Coll Radiol 2013; 04:540-981 6. Scattered diverticulosis along the distal descending and sigmoid colon, without evidence of diverticulitis.   Electronically Signed   By: Roanna Raider M.D.   On: 07/20/2014 03:23   Dg Chest Port 1 View  07/20/2014   CLINICAL DATA:  Acute onset of confusion. Cough. Initial encounter.  EXAM: PORTABLE CHEST - 1 VIEW  COMPARISON:  Chest radiograph and CTA of the chest performed 07/24/2013  FINDINGS: The lungs are well-aerated. Mild peribronchial thickening is noted. Minimal left basilar opacity likely reflects atelectasis. There is no evidence of pleural effusion or pneumothorax.  The cardiomediastinal silhouette is borderline enlarged. No acute osseous abnormalities are seen.  IMPRESSION: Mild peribronchial thickening noted. Minimal left basilar opacity likely reflects atelectasis. Borderline cardiomegaly.   Electronically Signed   By: Roanna Raider M.D.   On: 07/20/2014 04:21    EKG:   Orders placed or performed during the hospital encounter of 07/19/14  . ED EKG  . ED EKG    ASSESSMENT AND PLAN:   1. Metabolic encephalopathy secondary to hypokalemia, pneumonia. Symptoms improving,  follow blood cultures.  community-acquired pneumonia continue Rocephin and Zithromax next number hypokalemia secondary to dehydration continue IV fluids with potassium. Depression Hypertension, bradycardia received atenolol this morning we stopped it no more atenolol for her. Hyperlipidemia CAD continue aspirin, Plavix. Recently treated UTI Deconditioning physician family requesting physical therapy evaluation and possible placement.  discussed the plan with patient's daughter     All the records are reviewed and case discussed with Care Management/Social Workerr. Management plans  discussed with the patient, family and they are in agreement.  CODE STATUS: full  TOTAL TIME TAKING CARE OF THIS PATIENT: 35 minutes.   POSSIBLE D/C IN 2-3 DAYS, DEPENDING ON CLINICAL CONDITION.   Katha HammingKONIDENA,Savino Whisenant M.D on 07/20/2014 at 10:08 AM  Between 7am to 6pm - Pager - [phone]  After 6pm go to [email or web address] - password [email or web address]  [facility]  Office  [phone]  CC: Primary care physician; No primary care provider on file.

## 2014-07-20 NOTE — ED Notes (Signed)
Son reports pt does not confuse anyone but him. Report pt mixes him and his deceased father up.

## 2014-07-20 NOTE — ED Notes (Signed)
Pt sitting in stretcher drinking coffee, family at bedside. Pt has coarse cough noted, resp even and unlabored. MD aware.

## 2014-07-20 NOTE — ED Notes (Signed)
Pt has returned from CT. Resting in bed. Bed locked and in lowest position.

## 2014-07-20 NOTE — Plan of Care (Signed)
Problem: Discharge Progression Outcomes Goal: Barriers To Progression Addressed/Resolved Individualization: Patient goes by Martha Hunter lives at home, alone; gets assistance from son and daughter in law and meals on wheels.  Recently diagnosed with UTI and completed round of antibiotics. Past Medical history:HTN, Hypercholesteremia, and Depression, currently controlled with home medications.  High fall risk, bed alarm activated and hourly safety rounds with toileting offered.     Goal: Other Discharge Outcomes/Goals Outcome: Progressing Plan of care progress to goals: Barriers- new onset confusion  Discharge- care management and social work consult entered for possible placement  Oxygen- O2 sats 96% on RA, respirations even and unlabored.  Pain-denies pain  Hemodynamic-Potassium level 3.3, IVF with potassium at 1575ml/hr ordered. Diet-no c/o nausea or vomiting Vaccines documented- son to update daytime RN of status of pneumonia vaccine. High fall risk- remains free from injury this shift.

## 2014-07-20 NOTE — ED Notes (Signed)
ADM MD at bedside.

## 2014-07-21 MED ORDER — AZITHROMYCIN 250 MG PO TABS
500.0000 mg | ORAL_TABLET | Freq: Every day | ORAL | Status: DC
  Administered 2014-07-22 – 2014-07-23 (×2): 500 mg via ORAL
  Filled 2014-07-21 (×2): qty 2

## 2014-07-21 NOTE — Progress Notes (Signed)
[facility] [place] at [facility]   PATIENT NAME: Martha Hunter    MR#:  [id number]  DATE OF BIRTH:  1928/12/05  SUBJECTIVE: 81 year old female patient admitted for confusion, cough, pneumonia, hypokalemia, dehydration.    CHIEF COMPLAINT:   Chief Complaint  Patient presents with  . Altered Mental Status   Feeling better this morning. No shortness of breath. Mental status seems to be improving slightly. Her son is at the bedside.  REVIEW OF SYSTEMS:   Review of Systems  Constitutional: Negative for fever.  Respiratory: Negative for shortness of breath.   Cardiovascular: Negative for chest pain and palpitations.  Gastrointestinal: Negative for nausea, vomiting and abdominal pain.  Genitourinary: Negative for dysuria.  Psychiatric/Behavioral: Positive for memory loss.   DRUG ALLERGIES:   Allergies  Allergen Reactions  . Nitrates, Organic Other (See Comments)    Reaction: unknown  . Penicillins Other (See Comments)    Reaction: unknown  . Sulfa Antibiotics Other (See Comments)    Reaction: unknown     VITALS:  Blood pressure 144/55, pulse 49, temperature 98 F (36.7 C), temperature source Oral, resp. rate 18, height 5' (1.524 m), weight 71.623 kg (157 lb 14.4 oz), SpO2 92 %.  PHYSICAL EXAMINATION:  GENERAL:  81 y.o.-year-old patient lying in the bed with no acute distress.  EYES: Pupils equal, round, reactive to light and accommodation. No scleral icterus. Extraocular muscles intact.  HEENT: Head atraumatic, normocephalic. Oropharynx and nasopharynx clear.  NECK:  Supple, no jugular venous distention. No thyroid enlargement, no tenderness.  LUNGS: Good air movement, crackles at the right base, no coughing wheezing rhonchi CARDIOVASCULAR: S1, S2 normal. No murmurs, rubs, or gallops.  ABDOMEN: Soft, nontender, nondistended. Bowel sounds present. No organomegaly or mass.  EXTREMITIES: No pedal edema, cyanosis, or clubbing.  NEUROLOGIC: Cranial  nerves II through XII are intact. Muscle strength 5/5 in all extremities. Sensation intact. Gait not checked.  PSYCHIATRIC: The patient is alert and oriented to person and place. Thinks it is 2014  SKIN: No obvious rash, lesion, or ulcer.   LABORATORY PANEL:   CBC  Recent Labs Lab 07/19/14 2257  WBC 7.4  HGB 13.8  HCT 42.7  PLT 179   ------------------------------------------------------------------------------------------------------------------  Chemistries   Recent Labs Lab 07/19/14 2257  NA 141  K 3.3*  CL 104  CO2 26  GLUCOSE 139*  BUN 24*  CREATININE 1.12*  CALCIUM 9.5  AST 26  ALT 15  ALKPHOS 72  BILITOT 0.4   ------------------------------------------------------------------------------------------------------------------  Cardiac Enzymes No results for input(s): TROPONINI in the last 168 hours. ------------------------------------------------------------------------------------------------------------------  RADIOLOGY:  Ct Abdomen Pelvis W Contrast  07/20/2014   CLINICAL DATA:  Acute onset of left lower quadrant abdominal tenderness. Initial encounter.  EXAM: CT ABDOMEN AND PELVIS WITH CONTRAST  TECHNIQUE: Multidetector CT imaging of the abdomen and pelvis was performed using the standard protocol following bolus administration of intravenous contrast.  CONTRAST:  80mL OMNIPAQUE IOHEXOL 300 MG/ML  SOLN  COMPARISON:  CT of the abdomen and pelvis from 06/29/2010  FINDINGS: Dense airspace opacification is noted within the left lower lobe, concerning for pneumonia.  The liver and spleen are unremarkable in appearance. Minimal stones are noted dependently within the gallbladder. The gallbladder is otherwise unremarkable. The pancreas and adrenal glands are unremarkable.  Scattered bilateral renal cysts are seen, measuring up to 5.4 cm in size. Mild bilateral renal pelvicaliectasis remains within normal limits, without evidence of hydronephrosis. No renal or ureteral  stones are identified. Mild nonspecific  perinephric stranding is noted bilaterally.  No free fluid is identified. The small bowel is unremarkable in appearance. The stomach is within normal limits. No acute vascular abnormalities are seen. Diffuse calcification is noted along the abdominal aorta and its branches.  There is mild focal aneurysmal dilatation of the proximal abdominal aorta at the level of the diaphragm, measuring up to 3.3 cm in diameter. This resolves above the level of the celiac trunk.  The appendix is not fully characterized but appears grossly unremarkable, without evidence of appendicitis. The colon is partially filled with stool. Scattered diverticulosis is noted along the distal descending and sigmoid colon, without evidence of diverticulitis.  The bladder is moderately distended and grossly unremarkable. The uterus is unremarkable in appearance. The ovaries are relatively symmetric. No suspicious adnexal masses are seen. No inguinal lymphadenopathy is seen.  No acute osseous abnormalities are identified.  IMPRESSION: 1. Dense airspace opacification within the left lower lung lobe is concerning for pneumonia. 2. Cholelithiasis; gallbladder otherwise unremarkable. 3. Scattered bilateral renal cysts noted. 4. Diffuse calcification along the abdominal aorta and its branches. 5. 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 This resolves above the level of the celiac trunk. Recommend followup by ultrasound in 3 years. This recommendation follows ACR consensus guidelines: White Paper of the ACR Incidental Findings Committee II on Vascular Findings. J Am Coll Radiol 2013; 29:562-13010:789-794 6. Scattered diverticulosis along the distal descending and sigmoid colon, without evidence of diverticulitis.   Electronically Signed   By: Roanna RaiderJeffery  Chang M.D.   On: 07/20/2014 03:23   Dg Chest Port 1 View  07/20/2014   CLINICAL DATA:   Acute onset of confusion. Cough. Initial encounter.  EXAM: PORTABLE CHEST - 1 VIEW  COMPARISON:  Chest radiograph and CTA of the chest performed 07/24/2013  FINDINGS: The lungs are well-aerated. Mild peribronchial thickening is noted. Minimal left basilar opacity likely reflects atelectasis. There is no evidence of pleural effusion or pneumothorax.  The cardiomediastinal silhouette is borderline enlarged. No acute osseous abnormalities are seen.  IMPRESSION: Mild peribronchial thickening noted. Minimal left basilar opacity likely reflects atelectasis. Borderline cardiomegaly.   Electronically Signed   By: Roanna RaiderJeffery  Chang M.D.   On: 07/20/2014 04:21    EKG:   Orders placed or performed during the hospital encounter of 07/19/14  . ED EKG  . ED EKG  . EKG 12-Lead  . EKG 12-Lead    ASSESSMENT AND PLAN:   1. Metabolic encephalopathy : Improving - Due to infection, pneumonia and urinary tract infection - Does have some baseline dementia, mild - Family is concerned as she lives by herself that she will not be able to care for self - continue abilify and valium prn  2. community-acquired pneumonia  - continue Rocephin and Zithromax  - no culture data  3. UTI - no culture data - continue rocephin  4. Hypokalemia - replace, recheck - check MG  5. Bradycardia - continue to hold atenolol  6. CAD - continue ASA, plavix, statin  7. Hypertension - controlled, continue amlodipine, losartan, hydralazine  8. Deconditioning - PT consultation for further recomendations   All the records are reviewed and case discussed with Care Management/Social Workerr. Management plans discussed with the patient, family and they are in agreement.  CODE STATUS: full  TOTAL TIME TAKING CARE OF THIS PATIENT: 35 minutes.   POSSIBLE D/C IN 2-3 DAYS, DEPENDING ON CLINICAL CONDITION. Care discussed with  patient's son.   Elby Showers M.D on 07/21/2014 at 11:39 AM  Between 7am to 6pm - Pager -  [phone]  After 6pm go to [email or web address] - password [email or web address] [facility]  [facility]  Office  [phone]  CC: Primary care physician; No primary care provider on file.

## 2014-07-21 NOTE — Care Management (Signed)
Admitted to [facility] with the diagnosis of community acquired pneumonia. Lives alone. Son is Jonny RuizJohn Vonna Kotyk(Jay) [phone]). Home Health thru [facility] for physical therapy about a year ago. Skilled nursing thru [facility] last winter. Dr. Madelin RearBaboff is primary care physician, seen last week. Uses a styker walker. States she is hard of hearing and does wear hearing aides.  IV Rocephin and Azithromax continues. Temperature range 100-98.9 for the last 24 hours. Gwenette GreetBrenda S Holland RN MSN Care Management [phone]

## 2014-07-21 NOTE — Progress Notes (Signed)
PHARMACIST - PHYSICIAN COMMUNICATION DR:  Clent RidgesWalsh CONCERNING: Antibiotic IV to Oral Route Change Policy  RECOMMENDATION: This patient is receiving Azithromycin by the intravenous route.  Based on criteria approved by the Pharmacy and Therapeutics Committee, the antibiotic(s) is/are being converted to the equivalent oral dose form(s) to begin with next dose 07/22/14.   DESCRIPTION: These criteria include:  Patient being treated for a respiratory tract infection, urinary tract infection, cellulitis or clostridium difficile associated diarrhea if on metronidazole  The patient is not neutropenic and does not exhibit a GI malabsorption state  The patient is eating (either orally or via tube) and/or has been taking other orally administered medications for a least 24 hours  The patient is improving clinically and has a Tmax < 100.5   If you have questions about this conversion, please contact the Pharmacy Department  []   [phone]( [phone] )  [facility] [x]   [phone]( [phone] )  [facility] []   [phone]( [phone] )  [facility] []   [phone]( [phone] )  [facility] []   [phone]( [phone] )  [facility]

## 2014-07-21 NOTE — Evaluation (Signed)
Physical Therapy Evaluation Patient Details Name: Martha Hunter MRN: [id number] DOB: Aug 25, 1928 Today's Date: 07/21/2014   History of Present Illness  Pt admitted for confusion and CAP. Pt lives alone.  Clinical Impression  Pt is a pleasant 81 year old female who was admitted for confusion and CAP. Pt performs bed mobility, transfers, and ambulation with min assist and rw. Pt confused upon arrival, thinking she is in [place]. Pt demonstrates deficits with strength/balance/endurance/safety. Pt fatigues quickly with ambulation and has difficulty with obstacle avoidance and has multiple LOB during ambulation with assist for correction. Would benefit from skilled PT to address above deficits and promote optimal return to PLOF. Recommend transition to STR upon discharge from acute hospitalization.       Follow Up Recommendations SNF    Equipment Recommendations       Recommendations for Other Services       Precautions / Restrictions Precautions Precautions: None Restrictions Weight Bearing Restrictions: No      Mobility  Bed Mobility Overal bed mobility: Needs Assistance Bed Mobility: Supine to Sit     Supine to sit: Min assist     General bed mobility comments: bed mobility performed with min assist with mod cues for sequencing and scooting towards EOB. Pt has difficulty with processing/attention to task  Transfers Overall transfer level: Needs assistance Equipment used: 4-wheeled walker Transfers: Sit to/from Stand Sit to Stand: Min assist         General transfer comment: sit<>Stand with rollater and min assist. Pt requires cues for locking breaks prior to standing. Once standing, pt requires assistance for maintaining standing balance with increased sway.  Ambulation/Gait Ambulation/Gait assistance: Min assist Ambulation Distance (Feet): 70 Feet Assistive device: 4-wheeled walker       General Gait Details: ambulated using rollater and min assist. Slow reciprocal  gait pattern performed, however increased sway noted with 2 LOB with min assist for correction. Pt requires cues for obstacle avoidance as she was frequently running into the bed/wall during ambulation. Would recommend further gait training with RW for increased stability in future treatments.  Stairs            Wheelchair Mobility    Modified Rankin (Stroke Patients Only)       Balance Overall balance assessment: History of Falls;Needs assistance Sitting-balance support: Feet supported Sitting balance-Leahy Scale: Good     Standing balance support: Bilateral upper extremity supported Standing balance-Leahy Scale: Fair                               Pertinent Vitals/Pain Pain Assessment: 0-10 Pain Score: 0-No pain    Home Living Family/patient expects to be discharged to:: Unsure Living Arrangements: Alone                    Prior Function Level of Independence: Independent with assistive device(s)               Hand Dominance        Extremity/Trunk Assessment   Upper Extremity Assessment: Generalized weakness           Lower Extremity Assessment: Generalized weakness (L LE weaker compared to R LE)         Communication   Communication: No difficulties  Cognition Arousal/Alertness: Awake/alert Behavior During Therapy: WFL for tasks assessed/performed Overall Cognitive Status: Impaired/Different from baseline Area of Impairment: Orientation Orientation Level: Disoriented to;Place;Situation  General Comments      Exercises        Assessment/Plan    PT Assessment Patient needs continued PT services  PT Diagnosis Difficulty walking   PT Problem List Decreased strength;Decreased activity tolerance;Decreased balance;Decreased mobility  PT Treatment Interventions DME instruction;Gait training;Therapeutic exercise;Balance training   PT Goals (Current goals can be found in the Care Plan section)  Acute Rehab PT Goals Patient Stated Goal: to go home PT Goal Formulation: With patient Time For Goal Achievement: 08/04/14 Potential to Achieve Goals: Good    Frequency Min 2X/week   Barriers to discharge Decreased caregiver support      Co-evaluation               End of Session Equipment Utilized During Treatment: Gait belt Activity Tolerance: Patient limited by fatigue Patient left: in chair;with chair alarm set Nurse Communication: Mobility status         Time: [id number] PT Time Calculation (min) (ACUTE ONLY): 26 min   Charges:   PT Evaluation $Initial PT Evaluation Tier I: 1 Procedure     PT G Codes:        Dulcey Riederer 07/21/2014, 4:25 PM  Elizabeth PalauStephanie Makenly [place], PT, DPT [phone]

## 2014-07-21 NOTE — Clinical Social Work Note (Signed)
Clinical Social Work Assessment  Patient Details  Name: Martha Hunter MRN: [id number] Date of Birth: 13-Dec-1928  Date of referral:  07/21/14               Reason for consult:  Facility Placement                Permission sought to share information with:  Family Supports Permission granted to share information::  No   Housing/Transportation Living arrangements for the past 2 months:  [place] of Information:  Adult Children Patient Interpreter Needed:  None Criminal Activity/Legal Involvement Pertinent to Current Situation/Hospitalization:  No - Comment as needed Significant Relationships:  Adult Children Lives with:  Self Do you feel safe going back to the place where you live?  No Need for family participation in patient care:  Yes (Comment)  Care giving concerns:  Pt lives alone and may not be safe returning.    Social Worker assessment / plan:  CSW met with pt's family to address consult. CSW introduced herself and explained role of social work. CSW also explained the process of discharging to SNF. PT is recommending SNF, however family is determining long term care plans.   Pt's family is agreeable to SNF placement for STR. CSW initiated SNF search and will follow up with bed offers. FL2 is on chart for MDs signature.   Employment status:  Retired Forensic scientist:  [employer] PT Recommendations:  [place] / Referral to community resources:  [place], Other (Comment Required) (Assisted Living Facilities)  Patient/Family's Response to care:  Pt's family are agreeable to discharge plan.   Patient/Family's Understanding of and Emotional Response to Diagnosis, Current Treatment, and Prognosis:  Pt's family is aware that pt will not be able to return home safely, however they are looking for ALF placement.   Emotional Assessment Appearance:    Attitude/Demeanor/Rapport:  Other (Confused) Affect (typically  observed):  Other Orientation:  Fluctuating Orientation (Suspected and/or reported Sundowners) Alcohol / Substance use:  Never Used Psych involvement (Current and /or in the community):  No (Comment)  Discharge Needs  Concerns to be addressed:  No discharge needs identified Readmission within the last 30 days:  No Current discharge risk:  None Barriers to Discharge:  No Barriers Identified   Darden Dates, LCSW 07/21/2014, 4:52 PM

## 2014-07-21 NOTE — Progress Notes (Signed)
Heart rate around 45 bpm. Pt on off unit telemetry monitor. Notified Dr Clint GuyHower. No new orders. Continue to monitor.Geri Seminolerusilla A Jackson, RN

## 2014-07-21 NOTE — Plan of Care (Signed)
Problem: Discharge Progression Outcomes Goal: Other Discharge Outcomes/Goals Outcome: Progressing Plan of care with progression to goal:  Pt alert today. Oriented to  Place / person.  P.t. Saw today and  Pt tol well.  Cont incont of b/b.  Possibly  To snf at discharge.son and dtr in law in this pm and pt knew  Them.

## 2014-07-21 NOTE — Plan of Care (Signed)
Problem: Discharge Progression Outcomes Goal: Other Discharge Outcomes/Goals Outcome: Progressing Plan of care progress to goals: Barriers-patient may need placement at discharge, care management and social work consult entered.    Discharge- care management and social work consult entered for possible placement   Oxygen- O2 sats 90-95% on RA, respirations unlabored, non productive-congested cough noted this shift. Pain-denies pain   Hemodynamic- potassium 3.3, IVF with potassium supplement d/c'd today, continue to monitor labs. Off unit telemetry SB, HR 50-60's. Diet-no c/o nausea or vomiting Vaccines documented- daughter in law stated that patient had pneumonia vaccine after age 81. High fall risk- remains free from injury this shift.

## 2014-07-22 ENCOUNTER — Encounter: Payer: Self-pay | Admitting: Nurse Practitioner

## 2014-07-22 DIAGNOSIS — R001 Bradycardia, unspecified: Secondary | ICD-10-CM

## 2014-07-22 LAB — CBC
HCT: 34 % — ABNORMAL LOW (ref 35.0–47.0)
Hemoglobin: 11.3 g/dL — ABNORMAL LOW (ref 12.0–16.0)
MCH: 27.3 pg (ref 26.0–34.0)
MCHC: 33.2 g/dL (ref 32.0–36.0)
MCV: 82.4 fL (ref 80.0–100.0)
PLATELETS: 146 10*3/uL — AB (ref 150–440)
RBC: 4.12 MIL/uL (ref 3.80–5.20)
RDW: 15.4 % — ABNORMAL HIGH (ref 11.5–14.5)
WBC: 7 10*3/uL (ref 3.6–11.0)

## 2014-07-22 LAB — BASIC METABOLIC PANEL
Anion gap: 8 (ref 5–15)
BUN: 17 mg/dL (ref 6–20)
CHLORIDE: 106 mmol/L (ref 101–111)
CO2: 27 mmol/L (ref 22–32)
Calcium: 8.7 mg/dL — ABNORMAL LOW (ref 8.9–10.3)
Creatinine, Ser: 1.2 mg/dL — ABNORMAL HIGH (ref 0.44–1.00)
GFR calc Af Amer: 47 mL/min — ABNORMAL LOW (ref >60)
GFR, EST NON AFRICAN AMERICAN: 41 mL/min — AB (ref >60)
Glucose, Bld: 101 mg/dL — ABNORMAL HIGH (ref 65–99)
POTASSIUM: 3.4 mmol/L — AB (ref 3.5–5.1)
Sodium: 141 mmol/L (ref 135–145)

## 2014-07-22 LAB — MAGNESIUM: MAGNESIUM: 1.7 mg/dL (ref 1.7–2.4)

## 2014-07-22 MED ORDER — POTASSIUM CHLORIDE CRYS ER 20 MEQ PO TBCR
40.0000 meq | EXTENDED_RELEASE_TABLET | Freq: Once | ORAL | Status: AC
  Administered 2014-07-22: 40 meq via ORAL
  Filled 2014-07-22: qty 2

## 2014-07-22 NOTE — Clinical Social Work Note (Signed)
Clinical Social Worker presented bed offers to pt's daughter, [facility]. CSW updated Admissions Coordinator. CSW will continue to follow to facilitate discharge to [facility] when medically stable.   Dede QuerySarah Zaydon Kinser, MSW, LCSW Clinical Social Worker  [phone]

## 2014-07-22 NOTE — Progress Notes (Signed)
Pt heart rate going down into the upper 30's but not sustaining. Notified Dr Clint GuyHower. No new orders. Continue to monitor. Geri Seminolerusilla A Jackson, RN

## 2014-07-22 NOTE — Plan of Care (Signed)
Problem: Discharge Progression Outcomes Goal: Other Discharge Outcomes/Goals Outcome: Progressing Plan of care progress to goal:  1. Pain:      Tylenol 650mg  oral given for headache and shoulder pain. Improvement noted. 2. Hemodynamically stable:      Hypertensive.      Bradycardia.     Cardiology consult. 3. Diet:     Remains on Healthy Heart Diet. Great appetite. Tolerating well. 4. Activity:     OOB with PT.     One person assist.

## 2014-07-22 NOTE — Consult Note (Signed)
CARDIOLOGY CONSULT NOTE   Patient ID: Martha DialsRuth Hunter MRN: [id number], DOB/AGE: [birth year]   Admit date: 07/19/2014 Date of Consult: 07/22/2014   Primary Physician: No primary care provider on file. Primary Cardiologist: new - seen by M. Kirke CorinArida, MD   Pt. Profile  81 y/o female without prior cardiac hx who was admitted with CAP and noted to have nocturnal bradycardia.  Problem List  Past Medical History  Diagnosis Date  . Hypertension   . Hypercholesteremia   . Depression   . Staph infection   . CAP (community acquired pneumonia)     a. 07/2014.    Past Surgical History  Procedure Laterality Date  . None       Allergies  Allergies  Allergen Reactions  . Nitrates, Organic Other (See Comments)    Reaction: unknown  . Penicillins Other (See Comments)    Reaction: unknown  . Sulfa Antibiotics Other (See Comments)    Reaction: unknown    HPI   81 y/o female w/o significant prior cardiac history.  She has been living by herself @ home in [place] and requires a walker to get around, but has previously been independent.  She was admitted on 6/5 with altered mental status and weakness, along with left flank pain in the setting of recent UTI.  CT of the Abd and Pelvis incidentally showed LLL PNA.  She was admitted and placed on abx.  Since admission, she has been found to have nocturnal bradycardia w/o significant heart block, with rates dipping into the high 30's at times.  Longest pause was 2.1 seconds.  She has not had any symptomatic or profound daytime bradycardia or heart block.  She has been on chronic atenolol therapy and this was discontinued this AM.  Her daughters also reports that she snores.  She denies chest pain, palpitations, pnd, orthopnea, n, v, dizziness, syncope, edema, weight gain, or early satiety.   Inpatient Medications  . amLODipine  5 mg Oral Daily  . ARIPiprazole  2 mg Oral Daily  . azithromycin  500 mg Oral Daily  . cefTRIAXone (ROCEPHIN)  IV  1 g  Intravenous Q24H  . cholecalciferol  1,000 Units Oral Daily  . clopidogrel  75 mg Oral Daily  . docusate sodium  100 mg Oral BID  . heparin  5,000 Units Subcutaneous 3 times per day  . hydrALAZINE  50 mg Oral TID  . losartan  100 mg Oral Daily  . oxybutynin  5 mg Oral QHS  . pravastatin  20 mg Oral Daily  . sertraline  100 mg Oral Daily  . sodium chloride  3 mL Intravenous Q12H   Family History Family History  Problem Relation Age of Onset  . CAD      parents and 9 siblings all died with CAD.    Social History History   Social History  . Marital Status: Married    Spouse Name: N/A  . Number of Children: N/A  . Years of Education: N/A   Occupational History  . Not on file.   Social History Main Topics  . Smoking status: Former Games developermoker  . Smokeless tobacco: Not on file  . Alcohol Use: No  . Drug Use: No  . Sexual Activity: Not on file   Other Topics Concern  . Not on file   Social History Narrative   Lives alone. Ambulates with a walker due to rare episodes of falls and dizziness    Review of Systems  General:  No chills,  fever, night sweats or weight changes.  Cardiovascular:  No chest pain, +++ dyspnea, occasional dependent edema, no orthopnea, palpitations, paroxysmal nocturnal dyspnea. Dermatological: No rash, lesions/masses Respiratory: No cough, +++ dyspnea Urologic: No hematuria, dysuria Abdominal:   No nausea, vomiting, diarrhea, bright red blood per rectum, melena, or hematemesis Neurologic:  No visual changes, +++ wkns, +++ changes in mental status prior to admission - improving. All other systems reviewed and are otherwise negative except as noted above.  Physical Exam  Blood pressure 144/51, pulse 55, temperature 98 F (36.7 C), temperature source Oral, resp. rate 18, height 5' (1.524 m), weight 159 lb 6.4 oz (72.303 kg), SpO2 92 %.  General: Pleasant, NAD Psych: Normal affect. Neuro: Alert and oriented X 3. Moves all extremities  spontaneously. HEENT: Normal  Neck: Supple without bruits or JVD. Lungs:  Resp regular and unlabored, diminished breath sounds bilat bases. Heart: RRR no s3, s4, or murmurs. Abdomen: Soft, non-tender, non-distended, BS + x 4.  Extremities: No clubbing, cyanosis or edema. DP/PT/Radials 2+ and equal bilaterally.  Labs  Lab Results  Component Value Date   WBC 7.0 07/22/2014   HGB 11.3* 07/22/2014   HCT 34.0* 07/22/2014   MCV 82.4 07/22/2014   PLT 146* 07/22/2014    Recent Labs Lab 07/19/14 2257 07/22/14 0444  NA 141 141  K 3.3* 3.4*  CL 104 106  CO2 26 27  BUN 24* 17  CREATININE 1.12* 1.20*  CALCIUM 9.5 8.7*  PROT 7.7  --   BILITOT 0.4  --   ALKPHOS 72  --   ALT 15  --   AST 26  --   GLUCOSE 139* 101*   Radiology/Studies  Ct Abdomen Pelvis W Contrast  07/20/2014   CLINICAL DATA:  Acute onset of left lower quadrant abdominal tenderness. Initial encounter.  EXAM: CT ABDOMEN AND PELVIS WITH CONTRAST  TECHNIQUE: Multidetector CT imaging of the abdomen and pelvis was performed using the standard protocol following bolus administration of intravenous contrast.  CONTRAST:  80mL OMNIPAQUE IOHEXOL 300 MG/ML  SOLN  COMPARISON:  CT of the abdomen and pelvis from 06/29/2010  FINDINGS: Dense airspace opacification is noted within the left lower lobe, concerning for pneumonia.  The liver and spleen are unremarkable in appearance. Minimal stones are noted dependently within the gallbladder. The gallbladder is otherwise unremarkable. The pancreas and adrenal glands are unremarkable.  Scattered bilateral renal cysts are seen, measuring up to 5.4 cm in size. Mild bilateral renal pelvicaliectasis remains within normal limits, without evidence of hydronephrosis. No renal or ureteral stones are identified. Mild nonspecific perinephric stranding is noted bilaterally.  No free fluid is identified. The small bowel is unremarkable in appearance. The stomach is within normal limits. No acute vascular  abnormalities are seen. Diffuse calcification is noted along the abdominal aorta and its branches.  There is mild focal aneurysmal dilatation of the proximal abdominal aorta at the level of the diaphragm, measuring up to 3.3 cm in diameter. This resolves above the level of the celiac trunk.  The appendix is not fully characterized but appears grossly unremarkable, without evidence of appendicitis. The colon is partially filled with stool. Scattered diverticulosis is noted along the distal descending and sigmoid colon, without evidence of diverticulitis.  The bladder is moderately distended and grossly unremarkable. The uterus is unremarkable in appearance. The ovaries are relatively symmetric. No suspicious adnexal masses are seen. No inguinal lymphadenopathy is seen.  No acute osseous abnormalities are identified.  IMPRESSION: 1. Dense airspace opacification within the  left lower lung lobe is concerning for pneumonia. 2. Cholelithiasis; gallbladder otherwise unremarkable. 3. Scattered bilateral renal cysts noted. 4. Diffuse calcification along the abdominal aorta and its branches. 5. 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 This resolves above the level of the celiac trunk. Recommend followup by ultrasound in 3 years. This recommendation follows ACR consensus guidelines: White Paper of the ACR Incidental Findings Committee II on Vascular Findings. J Am Coll Radiol 2013; 16:109-604 6. Scattered diverticulosis along the distal descending and sigmoid colon, without evidence of diverticulitis.   Electronically Signed   By: Roanna Raider M.D.   On: 07/20/2014 03:23   Dg Chest Port 1 View  07/20/2014   CLINICAL DATA:  Acute onset of confusion. Cough. Initial encounter.  EXAM: PORTABLE CHEST - 1 VIEW  COMPARISON:  Chest radiograph and CTA of the chest performed 07/24/2013  FINDINGS: The lungs are well-aerated. Mild peribronchial  thickening is noted. Minimal left basilar opacity likely reflects atelectasis. There is no evidence of pleural effusion or pneumothorax.  The cardiomediastinal silhouette is borderline enlarged. No acute osseous abnormalities are seen.  IMPRESSION: Mild peribronchial thickening noted. Minimal left basilar opacity likely reflects atelectasis. Borderline cardiomegaly.   Electronically Signed   By: Roanna Raider M.D.   On: 07/20/2014 04:21   ECG  RSR, 63, LAD, delayed R progression.  No acute st/t changes or evidence of heart block.  ASSESSMENT AND PLAN  1.  Sinus Bradycardia:  Pt noted to have sinus bradycardia over the past two nights with HR's dipping into the high 30's during hours of sleep in the setting of atenolol therapy and probable sleep apnea (dtrs report h/o snoring).  No evidence of heart block or prolonged pauses (longest 2.1 seconds) on tele.  Atenolol now on hold (last dose 6/6 @ 9:06 am).  No prior h/o presyncope or syncope.  Agree with discontinuation of atenolol.  If bradycardia persists despite discontinuation of BB, would consider d/c'ing aricept.  Could consider sleep study following recovery as outpt.  2.  Essential HTN:  BP 140's to 160's.  Follow off of atenolol.  May need to titrate amlodipine.  3.  CAP:  abx per IM.  4.  HL:  On statin.  5.  Hypokalemia:  On supplementation.  Signed, Nicolasa Ducking, NP 07/22/2014, 3:00 PM

## 2014-07-22 NOTE — Progress Notes (Signed)
[facility] [place] [facility]   PATIENT NAME: Martha DialsRuth Hunter    MR#:  [id number]  DATE OF BIRTH:  1928-12-11  SUBJECTIVE: 81 year old female patient admitted for confusion, cough, pneumonia, hypokalemia, dehydration.    CHIEF COMPLAINT:   Chief Complaint  Patient presents with  . Altered Mental Status   Very weak. Unable to walk far with physical therapy. Breathing easily. Still confused. Daughter and daughter-in-law are at the bedside  REVIEW OF SYSTEMS:   Review of Systems  Constitutional: Negative for fever.  Respiratory: Positive for cough and shortness of breath.   Cardiovascular: Negative for chest pain and palpitations.  Gastrointestinal: Negative for nausea, vomiting and abdominal pain.  Genitourinary: Negative for dysuria.   DRUG ALLERGIES:   Allergies  Allergen Reactions  . Nitrates, Organic Other (See Comments)    Reaction: unknown  . Penicillins Other (See Comments)    Reaction: unknown  . Sulfa Antibiotics Other (See Comments)    Reaction: unknown     VITALS:  Blood pressure 144/51, pulse 55, temperature 98 F (36.7 C), temperature source Oral, resp. rate 18, height 5' (1.524 m), weight 72.303 kg (159 lb 6.4 oz), SpO2 92 %.  PHYSICAL EXAMINATION:  GENERAL:  81 y.o.-year-old patient lying in the bed with no acute distress.  EYES: Pupils equal, round, reactive to light and accommodation. No scleral icterus. Extraocular muscles intact.  HEENT: Head atraumatic, normocephalic. Oropharynx and nasopharynx clear.  NECK:  Supple, no jugular venous distention. No thyroid enlargement, no tenderness.  LUNGS: Good air movement, crackles at the right base, no coughing wheezing rhonchi CARDIOVASCULAR: S1, S2 normal. No murmurs, rubs, or gallops.  ABDOMEN: Soft, nontender, nondistended. Bowel sounds present. No organomegaly or mass.  EXTREMITIES: No pedal edema, cyanosis, or clubbing.  NEUROLOGIC: Cranial nerves II through XII are intact.  Muscle strength 5/5 in all extremities. Sensation intact. Gait not checked.  PSYCHIATRIC: The patient is alert and oriented to person and place.  SKIN: No obvious rash, lesion, or ulcer.   LABORATORY PANEL:   CBC  Recent Labs Lab 07/22/14 0444  WBC 7.0  HGB 11.3*  HCT 34.0*  PLT 146*   ------------------------------------------------------------------------------------------------------------------  Chemistries   Recent Labs Lab 07/19/14 2257 07/22/14 0444  NA 141 141  K 3.3* 3.4*  CL 104 106  CO2 26 27  GLUCOSE 139* 101*  BUN 24* 17  CREATININE 1.12* 1.20*  CALCIUM 9.5 8.7*  MG  --  1.7  AST 26  --   ALT 15  --   ALKPHOS 72  --   BILITOT 0.4  --    ------------------------------------------------------------------------------------------------------------------  Cardiac Enzymes No results for input(s): TROPONINI in the last 168 hours. ------------------------------------------------------------------------------------------------------------------  RADIOLOGY:  No results found.  EKG:   Orders placed or performed during the hospital encounter of 07/19/14  . ED EKG  . ED EKG  . EKG 12-Lead  . EKG 12-Lead    ASSESSMENT AND PLAN:   1. Metabolic encephalopathy : Improving.  - Likely has some baseline dementia made worse by infection: pneumonia and urinary tract infection. - We will discharge to skilled nursing and then likely will live with family members - continue abilify and valium prn  2. community-acquired pneumonia  - continue Rocephin and Zithromax  - no culture data  3. UTI - no culture data - continue rocephin  4. Hypokalemia - replace, recheck - check MG  5. Bradycardia - continue to hold atenolol - brady to 30's overnight. Cardiology consultation requested.  6. CAD - continue ASA, plavix, statin  7. Hypertension - controlled, continue amlodipine, losartan, hydralazine  8. Deconditioning - will dc to SNF in am  All the  records are reviewed and case discussed with Care Management/Social Workerr. Management plans discussed with the patient, family and they are in agreement.  CODE STATUS: full  TOTAL TIME TAKING CARE OF THIS PATIENT: 35 minutes.   POSSIBLE D/C IN 2-3 DAYS, DEPENDING ON CLINICAL CONDITION. Care discussed with patient's daughter and daughter in law.   Elby Showers M.D on 07/22/2014 at 2:39 PM  Between 7am to 6pm - Pager - [phone]  After 6pm go to [email or web address] - password [email or web address] [facility]  [facility]  Office  [phone]  CC: Primary care physician; No primary care provider on file.

## 2014-07-22 NOTE — Progress Notes (Signed)
Physical Therapy Treatment Patient Details Name: Martha Hunter MRN: [id number] DOB: [birth year] Today's Date: 07/22/2014    History of Present Illness Pt admitted for confusion and CAP. Pt lives alone.    PT Comments    Pt with improvements during today's session, cont to requires cues for sequencing with bed mobility and gait.  Pt demonstrated some increased endurance with activity however was limited by nausea. Would continue to benefit from skilled PT to increase endurance, balance and gait for safety.   Follow Up Recommendations  SNF     Equipment Recommendations       Recommendations for Other Services       Precautions / Restrictions Precautions Precautions: None Restrictions Weight Bearing Restrictions: No    Mobility  Bed Mobility Overal bed mobility: Needs Assistance Bed Mobility: Supine to Sit     Supine to sit: Min assist     General bed mobility comments: pr requires min assist for scootingto EOB and boosting self in bed. able to roll side to side CGA  Transfers Overall transfer level: Needs assistance Equipment used: 4-wheeled walker Transfers: Sit to/from Stand Sit to Stand: Min assist         General transfer comment: cues for sequencing and proper use of rollater (brakes)  Ambulation/Gait Ambulation/Gait assistance: Min assist Ambulation Distance (Feet): 100 Feet Assistive device: 4-wheeled walker       General Gait Details: amb with rollator, shuffling gait, slow reciprocal gait patern, poor neogotiation bumbing intro objects and close to walls, able to correct with cues but poor carryover.   No LOB, but fatigued , to trial with RW next session.    Stairs            Wheelchair Mobility    Modified Rankin (Stroke Patients Only)       Balance Overall balance assessment: History of Falls Sitting-balance support: Feet supported Sitting balance-Leahy Scale: Good     Standing balance support: Bilateral upper extremity  supported Standing balance-Leahy Scale: Fair                      Cognition Arousal/Alertness: Awake/alert Behavior During Therapy: WFL for tasks assessed/performed Overall Cognitive Status: Impaired/Different from baseline                      Exercises      General Comments        Pertinent Vitals/Pain Pain Assessment: No/denies pain    Home Living                      Prior Function            PT Goals (current goals can now be found in the care plan section) Acute Rehab PT Goals Patient Stated Goal: to go home PT Goal Formulation: With patient Time For Goal Achievement: 08/04/14 Potential to Achieve Goals: Good    Frequency  Min 2X/week    PT Plan      Co-evaluation             End of Session Equipment Utilized During Treatment: Gait belt Activity Tolerance: Patient limited by fatigue Patient left: in bed;with call bell/phone within reach;with bed alarm set;with family/visitor present     Time: 8657-8469 PT Time Calculation (min) (ACUTE ONLY): 30 min  Charges:  $Gait Training: 8-22 mins $Therapeutic Activity: 8-22 mins  G Codes:      Martha Hunter 07/22/2014, 11:16 AM

## 2014-07-22 NOTE — Plan of Care (Signed)
Problem: Discharge Progression Outcomes Goal: Barriers To Progression Addressed/Resolved Individualization:  Outcome: Progressing Patient goes by Martha Hunter lives at home, alone; gets assistance from son and daughter in law and meals on wheels.   Recently diagnosed with UTI and completed round of antibiotics. Past Medical history:HTN, Hypercholesteremia, and Depression, currently controlled with home medications.   High fall risk, bed alarm activated and hourly safety rounds with toileting offered.   Goal: Discharge plan in place and appropriate Outcome: Progressing      Goal: Other Discharge Outcomes/Goals Outcome: Progressing Plan of care progress to goal: Pain: C/o Headache. Improved with administration of Tylenol. Hemodynamically stable: Pt b/p still elevated. Pt on three antihypertensive medications. Heart rate trending down into the 40's. MD notified. No new orders. Continue to monitor. Tolerating diet: Pt tolerating her heart healthy diet. Activity as tolerated: Pt being followed by Physical therapy. Up to chair with meals.

## 2014-07-22 NOTE — Clinical Social Work Placement (Signed)
   CLINICAL SOCIAL WORK PLACEMENT  NOTE  Date:  07/22/2014  Patient Details  Name: Martha Hunter MRN: [id number] Date of Birth: August 22, 1928  Clinical Social Work is seeking post-discharge placement for this patient at the Skilled  Nursing Facility level of care (*CSW will initial, date and re-position this form in  chart as items are completed):  Yes   Patient/family provided with [place] Clinical Social Work Department's list of facilities offering this level of care within the geographic area requested by the patient (or if unable, by the patient's family).  Yes   Patient/family informed of their freedom to choose among providers that offer the needed level of care, that participate in Medicare, Medicaid or managed care program needed by the patient, have an available bed and are willing to accept the patient.  Yes   Patient/family informed of [facility]'s ownership interest in [facility] and [facility], as well as of the fact that they are under no obligation to receive care at these facilities.  PASRR submitted to EDS on       PASRR number received on       Existing PASRR number confirmed on 07/21/14     FL2 transmitted to all facilities in geographic area requested by pt/family on 07/21/14     FL2 transmitted to all facilities within larger geographic area on       Patient informed that his/her managed care company has contracts with or will negotiate with certain facilities, including the following:        Yes   Patient/family informed of bed offers received.  Patient chooses bed at  [facility])     Physician recommends and patient chooses bed at  [facility](SNF)    Patient to be transferred to   on  .  Patient to be transferred to facility by       Patient family notified on   of transfer.  Name of family member notified:        PHYSICIAN       Additional Comment:    _______________________________________________ Dede QuerySarah Idalys Konecny, LCSW 07/22/2014, 10:47  AM

## 2014-07-23 DIAGNOSIS — J189 Pneumonia, unspecified organism: Secondary | ICD-10-CM | POA: Diagnosis present

## 2014-07-23 LAB — C DIFFICILE QUICK SCREEN W PCR REFLEX
C DIFFICILE (CDIFF) TOXIN: NEGATIVE
C Diff antigen: NEGATIVE
C Diff interpretation: NEGATIVE

## 2014-07-23 LAB — CBC
HCT: 35.2 % (ref 35.0–47.0)
Hemoglobin: 11.3 g/dL — ABNORMAL LOW (ref 12.0–16.0)
MCH: 26.5 pg (ref 26.0–34.0)
MCHC: 32.1 g/dL (ref 32.0–36.0)
MCV: 82.5 fL (ref 80.0–100.0)
Platelets: 154 10*3/uL (ref 150–440)
RBC: 4.27 MIL/uL (ref 3.80–5.20)
RDW: 15.3 % — ABNORMAL HIGH (ref 11.5–14.5)
WBC: 7.4 10*3/uL (ref 3.6–11.0)

## 2014-07-23 LAB — BASIC METABOLIC PANEL
ANION GAP: 8 (ref 5–15)
BUN: 14 mg/dL (ref 6–20)
CHLORIDE: 106 mmol/L (ref 101–111)
CO2: 26 mmol/L (ref 22–32)
Calcium: 8.8 mg/dL — ABNORMAL LOW (ref 8.9–10.3)
Creatinine, Ser: 1.16 mg/dL — ABNORMAL HIGH (ref 0.44–1.00)
GFR calc non Af Amer: 42 mL/min — ABNORMAL LOW (ref >60)
GFR, EST AFRICAN AMERICAN: 49 mL/min — AB (ref >60)
Glucose, Bld: 99 mg/dL (ref 65–99)
Potassium: 3.4 mmol/L — ABNORMAL LOW (ref 3.5–5.1)
Sodium: 140 mmol/L (ref 135–145)

## 2014-07-23 LAB — MAGNESIUM: Magnesium: 1.7 mg/dL (ref 1.7–2.4)

## 2014-07-23 MED ORDER — POTASSIUM CHLORIDE CRYS ER 20 MEQ PO TBCR
40.0000 meq | EXTENDED_RELEASE_TABLET | Freq: Once | ORAL | Status: AC
  Administered 2014-07-23: 40 meq via ORAL
  Filled 2014-07-23: qty 2

## 2014-07-23 MED ORDER — LEVOFLOXACIN 750 MG PO TABS: 750.0000 mg | ORAL_TABLET | Freq: Every day | ORAL | Status: AC

## 2014-07-23 MED ORDER — MAGNESIUM SULFATE 2 GM/50ML IV SOLN
2.0000 g | Freq: Once | INTRAVENOUS | Status: AC
  Administered 2014-07-23: 2 g via INTRAVENOUS
  Filled 2014-07-23: qty 50

## 2014-07-23 NOTE — Plan of Care (Signed)
Problem: Discharge Progression Outcomes Goal: Other Discharge Outcomes/Goals Outcome: Completed/Met Date Met:  07/23/14 MD making rounds. Discharge orders received. Social Engineer, civil (consulting) transfer to [facility]. Family at bedside. Per SW, paperwork completed and may transfer to [facility]. Spoke with daughter regarding transport. Daughter has to leave and return by 1500 to transport patient. IV removed. Report called to Elsie Lincoln., LPN at [facility]. No unanswered questions.  Discharge paperwork provided to daughter and instructed to give to facility Nurse. Discharged via wheelchair by auxiliary staff. Belongings sent with daughter.

## 2014-07-23 NOTE — Clinical Social Work Placement (Signed)
   CLINICAL SOCIAL WORK PLACEMENT  NOTE  Date:  07/23/2014  Patient Details  Name: Martha Hunter MRN: [id number] Date of Birth: 20-Sep-1928  Clinical Social Work is seeking post-discharge placement for this patient at the Skilled  Nursing Facility level of care (*CSW will initial, date and re-position this form in  chart as items are completed):  Yes   Patient/family provided with [place] Clinical Social Work Department's list of facilities offering this level of care within the geographic area requested by the patient (or if unable, by the patient's family).  Yes   Patient/family informed of their freedom to choose among providers that offer the needed level of care, that participate in Medicare, Medicaid or managed care program needed by the patient, have an available bed and are willing to accept the patient.  Yes   Patient/family informed of [facility]'s ownership interest in [facility] and [facility], as well as of the fact that they are under no obligation to receive care at these facilities.  PASRR submitted to EDS on       PASRR number received on       Existing PASRR number confirmed on 07/21/14     FL2 transmitted to all facilities in geographic area requested by pt/family on 07/21/14     FL2 transmitted to all facilities within larger geographic area on       Patient informed that his/her managed care company has contracts with or will negotiate with certain facilities, including the following:        Yes   Patient/family informed of bed offers received.  Patient chooses bed at  [facility]([place])     Physician recommends and patient chooses bed at  [facility](SNF)    [facility] on 07/23/14.  Patient to be transferred to facility by car     Patient family notified on 07/23/14 of transfer.  Name of family member notified:  Martha Hunter/Daugther     PHYSICIAN       Additional Comment:     _______________________________________________ Delight StareWashington, Jeremias Broyhill D, LCSW 07/23/2014, 12:10 PM

## 2014-07-23 NOTE — Progress Notes (Signed)
Physical Therapy Treatment Patient Details Name: Martha DialsRuth Hunter MRN: [id number] DOB: [birth year] Today's Date: 07/23/2014    History of Present Illness Pt admitted for confusion and CAP. Pt lives alone.    PT Comments    Pt with good tolerance to all activities today, improved bed mobility.  Sit to stand x 8 with cues to decrease posterior lean, use of RW.  Will continue to progress as tolerated to improve strength, endurance, and balance.   Follow Up Recommendations  SNF     Equipment Recommendations       Recommendations for Other Services       Precautions / Restrictions Precautions Precautions: None Restrictions Weight Bearing Restrictions: No    Mobility  Bed Mobility Overal bed mobility: Needs Assistance Bed Mobility: Supine to Sit     Supine to sit: Min guard     General bed mobility comments: Improved technique for bed mobility today decreased cues for sequencing  Transfers Overall transfer level: Needs assistance Equipment used: Rolling walker (2 wheeled) Transfers: Sit to/from Stand Sit to Stand: Min assist         General transfer comment: posterior lean agaist bed  Ambulation/Gait                 Stairs            Wheelchair Mobility    Modified Rankin (Stroke Patients Only)       Balance Overall balance assessment: History of Falls Sitting-balance support: Feet supported Sitting balance-Leahy Scale: Good     Standing balance support: Bilateral upper extremity supported Standing balance-Leahy Scale: Fair                      Cognition Arousal/Alertness: Awake/alert Behavior During Therapy: WFL for tasks assessed/performed Overall Cognitive Status: Impaired/Different from baseline                      Exercises      General Comments        Pertinent Vitals/Pain Pain Assessment: No/denies pain    Home Living                      Prior Function            PT Goals (current goals  can now be found in the care plan section) Acute Rehab PT Goals Patient Stated Goal: to go home    Frequency  Min 2X/week    PT Plan      Co-evaluation             End of Session   Activity Tolerance: Patient limited by fatigue Patient left: in bed;with call bell/phone within reach;with bed alarm set;with family/visitor present     Time: [id number] PT Time Calculation (min) (ACUTE ONLY): 34 min  Charges:  $Gait Training: 23-37 mins                    G Codes:      Donnajean Chesnut 07/23/2014, 9:44 AM Elba Dendinger, PTA

## 2014-07-23 NOTE — Clinical Social Work Note (Signed)
Pt is ready for d/c today.  CSW informed pt, her daughter, daughter-in-law and staff at [facility].  CSW sent d/c documentation to [facility].  Pt will be transported via car.  CSW provided room and report information to RN.  CSW signing off as there are no further needs at this time.  MartintonLynn Haili Donofrio, Kentucky[place] [phone]

## 2014-07-23 NOTE — Plan of Care (Signed)
Problem: Discharge Progression Outcomes Goal: Discharge plan in place and appropriate Outcome: Progressing Patient goes by Martha Hunter lives at home, alone; gets assistance from son and daughter in law and meals on wheels.    Recently diagnosed with UTI and completed round of antibiotics. Past Medical history:HTN, Hypercholesteremia, and Depression, currently controlled with home medications.    High fall risk, bed alarm activated and hourly safety rounds with toileting offered.       Goal: Other Discharge Outcomes/Goals Outcome: Progressing Plan of care progress to goal Pain: Occasional c/o headache and shoulder pain relieved by Tylenol Hemodynamically stable: SB on the monitor. B/p stable.  Tolerating diet: Pt tolerating diet. Activity appropriate: Working with Physical Therapy. Possible discharge today to snf today.

## 2014-07-23 NOTE — Progress Notes (Signed)
   07/22/14 0940  Clinical Encounter Type  Visited With Patient and family together  Visit Type Initial  Visited with patient and her daughter.  Patient said she was doing fine and would call us if needed.  Asbury Automotive GroupChaplain Aniel Hubble-pager [phone]

## 2014-07-23 NOTE — Discharge Summary (Signed)
[facility] [place] [facility]  DISCHARGE SUMMARY   PATIENT NAME: Martha DialsRuth Hunter    MR#:  [id number]  DATE OF BIRTH:  08-30-1928  DATE OF ADMISSION:  07/19/2014 ADMITTING PHYSICIAN: Arnaldo NatalMichael S Diamond, MD  DATE OF DISCHARGE: 07/23/2014  PRIMARY CARE PHYSICIAN: No primary care provider on file.    ADMISSION DIAGNOSIS:  Delirium [R41.0] Community acquired pneumonia [J18.9] Cystitis [N30.90]  DISCHARGE DIAGNOSIS:  Active Problems:   CAP (community acquired pneumonia) Cystitis  SECONDARY DIAGNOSIS:   Past Medical History  Diagnosis Date  . Hypertension   . Hypercholesteremia   . Depression   . Staph infection   . CAP (community acquired pneumonia)     a. 07/2014.  Marland Kitchen. History of TIA (transient ischemic attack)     a. on plavix chronically.    HOSPITAL COURSE:   1. Metabolic encephalopathy : Improving. Has mild baseline dementia and anxiety and is on Abilify and sertraline with Valium as needed. This is likely made worse by infection: pneumonia and urinary tract infection.  Family reports that she has had frequent urinary tract infections in the past with confusion. Discharge to skilled nursing and then likely will live with family members. She seems to be back towards her baseline today  2. community-acquired pneumonia : Chest x-ray June 5 shows. Bronchial thickening, minimal left basilar opacity atelectasis versus pneumonia. There is no culture data. She has been treated with Rocephin and azithromycin throughout the hospitalization. To be discharged on Levaquin and will complete a seven-day course. She has not been hypoxic or had any difficulty breathing. She has had a cough with some sputum production. Could use Mucinex.  3. UTI: She has chronic recurrent urinary tract infections. UA on admission showed too many to count white blood cells in the urine. Unfortunately there is no urine culture from the admission. She has been treated with Rocephin  throughout this admission and will be transitioned to Levaquin on discharge. Will complete a seven-day course as this is a more complicated urinary tract infection with encephalopathy. No significant leukocytosis, fever, change in blood pressure.  4. Hypokalemia and hypomagnesemia: Both replaced prior to discharge. Will need a BMP checked in 1 week.  5. Bradycardia: Heart rate generally in the 50s. She was on atenolol prior to admission and this has been held. She did dip into the 30s at night. She was seen by cardiology during the admission for further evaluation. They suggested an outpatient sleep study might be beneficial. Otherwise she is asymptomatic from  bradycardia and there is no indication for further intervention.  6. CAD: No chest pain or EKG changes. Troponins negative 3. Continue ASA, plavix, statin.  7. Hypertension: Controlled during hospitalization on amlodipine, losartan, hydralazine. Note that we did stop his atenolol during this admission so antihypertensives may need to be titrated accordingly  8. Deconditioning: As above therapy recommendation is for skilled nursing facility for continued rehabilitation. She is being discharged to [facility]  DISCHARGE CONDITIONS:   Fair  CONSULTS OBTAINED:  Treatment Team:  Iran OuchMuhammad A Arida, MD  DRUG ALLERGIES:   Allergies  Allergen Reactions  . Nitrates, Organic Other (See Comments)    Reaction: unknown  . Penicillins Other (See Comments)    Reaction: unknown  . Sulfa Antibiotics Other (See Comments)    Reaction: unknown     DISCHARGE MEDICATIONS:   Current Discharge Medication List    START taking these medications   Details  levofloxacin (LEVAQUIN) 750 MG tablet Take 1 tablet (  750 mg total) by mouth daily. Qty: 2 tablet, Refills: 0      CONTINUE these medications which have NOT CHANGED   Details  amLODipine (NORVASC) 5 MG tablet Take 5 mg by mouth at bedtime.     ARIPiprazole (ABILIFY) 2 MG tablet Take 2  mg by mouth every morning.     Cholecalciferol (VITAMIN D-3) 1000 UNITS CAPS Take 1 capsule by mouth daily.     clopidogrel (PLAVIX) 75 MG tablet Take 75 mg by mouth every morning.     diazepam (VALIUM) 5 MG tablet Take 5 mg by mouth daily as needed for anxiety.     hydrALAZINE (APRESOLINE) 50 MG tablet Take 100 mg by mouth 2 (two) times daily.     losartan (COZAAR) 100 MG tablet Take 100 mg by mouth every morning.     oxybutynin (DITROPAN-XL) 5 MG 24 hr tablet Take 5 mg by mouth at bedtime.    pravastatin (PRAVACHOL) 20 MG tablet Take 20 mg by mouth at bedtime.     Probiotic Product (PROBIOTIC DAILY PO) Take 1 tablet by mouth every morning.    sertraline (ZOLOFT) 100 MG tablet Take 100 mg by mouth every morning.       STOP taking these medications     atenolol (TENORMIN) 25 MG tablet          DISCHARGE INSTRUCTIONS:    DIET:  Cardiac diet  DISCHARGE CONDITION:  Fair  ACTIVITY:  Activity as tolerated, and per physical therapy recommendations.  OXYGEN:  Home Oxygen: No.   Oxygen Delivery: room air  DISCHARGE LOCATION:  nursing home [facility]  If you experience worsening of your admission symptoms, develop shortness of breath, life threatening emergency, suicidal or homicidal thoughts you must seek medical attention immediately by calling 911 or calling your MD immediately  if symptoms less severe.  You Must read complete instructions/literature along with all the possible adverse reactions/side effects for all the Medicines you take and that have been prescribed to you. Take any new Medicines after you have completely understood and accpet all the possible adverse reactions/side effects.   Please note  You were cared for by a hospitalist during your hospital stay. If you have any questions about your discharge medications or the care you received while you were in the hospital after you are discharged, you can call the unit and asked to speak with the  hospitalist on call if the hospitalist that took care of you is not available. Once you are discharged, your primary care physician will handle any further medical issues. Please note that NO REFILLS for any discharge medications will be authorized once you are discharged, as it is imperative that you return to your primary care physician (or establish a relationship with a primary care physician if you do not have one) for your aftercare needs so that they can reassess your need for medications and monitor your lab values.  Today   CHIEF COMPLAINT:   Chief Complaint  Patient presents with  . Altered Mental Status    HISTORY OF PRESENT ILLNESS:  HPI: The patient presents to the emergency department via private vehicle complaining of confusion. She finished a course of ciprofloxacin for urinary tract infection 3 days ago. Since that time she has had a cough that is progressively worse area did in the emergency department the patient complained of some left flank pain. A CT scan of the abdomen and pelvis was obtained to rule out kidney stone. Imaging did not  show nephrolithiasis that revealed a left lower lobe pneumonia. The patient does not required supplemental oxygen and her confusion is mildly improved but due to her risk factors the emergency department staff called for admission.  VITAL SIGNS:  Blood pressure 157/48, pulse 59, temperature 98.5 F (36.9 C), temperature source Oral, resp. rate 20, height 5' (1.524 m), weight 69.536 kg (153 lb 4.8 oz), SpO2 92 %.  I/O:   Intake/Output Summary (Last 24 hours) at 07/23/14 1054 Last data filed at 07/22/14 1839  Gross per 24 hour  Intake    360 ml  Output      0 ml  Net    360 ml    PHYSICAL EXAMINATION:  GENERAL:  81 y.o.-year-old patient lying in the bed with no acute distress. Weak EYES: Pupils equal, round, reactive to light and accommodation. No scleral icterus. Extraocular muscles intact.  HEENT: Head atraumatic, normocephalic.  Oropharynx and nasopharynx clear.  NECK:  Supple, no jugular venous distention. No thyroid enlargement, no tenderness.  LUNGS: Crackles in the right upper lobe, good air movement, cough no sputum production, no wheezing CARDIOVASCULAR: S1, S2 normal. No murmurs, rubs, or gallops.  ABDOMEN: Soft, non-tender, non-distended. Bowel sounds present. No organomegaly or mass.  EXTREMITIES: No pedal edema, cyanosis, or clubbing.  NEUROLOGIC: Cranial nerves II through XII are intact. Muscle strength 5/5 in all extremities. Sensation intact. Gait not checked.  PSYCHIATRIC: The patient is alert and oriented x 3.  SKIN: No obvious rash, lesion, or ulcer.   DATA REVIEW:   CBC  Recent Labs Lab 07/23/14 0527  WBC 7.4  HGB 11.3*  HCT 35.2  PLT 154    Chemistries   Recent Labs Lab 07/19/14 2257  07/23/14 0528  NA 141  < > 140  K 3.3*  < > 3.4*  CL 104  < > 106  CO2 26  < > 26  GLUCOSE 139*  < > 99  BUN 24*  < > 14  CREATININE 1.12*  < > 1.16*  CALCIUM 9.5  < > 8.8*  MG  --   < > 1.7  AST 26  --   --   ALT 15  --   --   ALKPHOS 72  --   --   BILITOT 0.4  --   --   < > = values in this interval not displayed.  Cardiac Enzymes No results for input(s): TROPONINI in the last 168 hours.  Microbiology Results  Results for orders placed or performed during the hospital encounter of 07/19/14  C difficile quick scan w PCR reflex ([facility] only)     Status: None   Collection Time: 07/23/14  7:27 AM  Result Value Ref Range Status   C Diff antigen NEGATIVE  Final   C Diff toxin NEGATIVE  Final   C Diff interpretation Negative for C. difficile  Final    RADIOLOGY:  No results found.  EKG:   Orders placed or performed during the hospital encounter of 07/19/14  . ED EKG  . ED EKG  . EKG 12-Lead  . EKG 12-Lead      Management plans discussed with the patient, family and they are in agreement.  CODE STATUS:     Code Status Orders        Start     Ordered   07/20/14 0555  Full  code   Continuous     07/20/14 0554    Advance Directive Documentation  Most Recent Value   Type of Advance Directive  Healthcare Power of Attorney   Pre-existing out of facility DNR order (yellow form or pink MOST form)     "MOST" Form in Place?        TOTAL TIME TAKING CARE OF THIS PATIENT: 45 minutes.    Elby Showers M.D on 07/23/2014 at 10:54 AM  Between 7am to 6pm - Pager - [phone]  After 6pm go to [email or web address] - password [email or web address] [facility]  [facility]  Office  [phone]  CC: Primary care physician; No primary care provider on file.

## 2014-09-16 ENCOUNTER — Ambulatory Visit: Payer: Self-pay | Admitting: Obstetrics and Gynecology

## 2014-10-14 ENCOUNTER — Ambulatory Visit (INDEPENDENT_AMBULATORY_CARE_PROVIDER_SITE_OTHER): Payer: Medicare Other | Admitting: Obstetrics and Gynecology

## 2014-10-14 ENCOUNTER — Encounter: Payer: Self-pay | Admitting: Obstetrics and Gynecology

## 2014-10-14 VITALS — BP 157/74 | HR 65 | Ht 60.0 in | Wt 144.7 lb

## 2014-10-14 DIAGNOSIS — N3289 Other specified disorders of bladder: Secondary | ICD-10-CM

## 2014-10-14 DIAGNOSIS — F329 Major depressive disorder, single episode, unspecified: Secondary | ICD-10-CM | POA: Insufficient documentation

## 2014-10-14 DIAGNOSIS — N8110 Cystocele, unspecified: Secondary | ICD-10-CM | POA: Diagnosis not present

## 2014-10-14 DIAGNOSIS — N952 Postmenopausal atrophic vaginitis: Secondary | ICD-10-CM | POA: Diagnosis not present

## 2014-10-14 DIAGNOSIS — IMO0002 Reserved for concepts with insufficient information to code with codable children: Secondary | ICD-10-CM | POA: Insufficient documentation

## 2014-10-14 DIAGNOSIS — R42 Dizziness and giddiness: Secondary | ICD-10-CM | POA: Insufficient documentation

## 2014-10-14 DIAGNOSIS — F32A Depression, unspecified: Secondary | ICD-10-CM | POA: Insufficient documentation

## 2014-10-14 DIAGNOSIS — E785 Hyperlipidemia, unspecified: Secondary | ICD-10-CM | POA: Insufficient documentation

## 2014-10-14 DIAGNOSIS — I10 Essential (primary) hypertension: Secondary | ICD-10-CM | POA: Insufficient documentation

## 2014-10-14 DIAGNOSIS — E669 Obesity, unspecified: Secondary | ICD-10-CM | POA: Insufficient documentation

## 2014-10-14 DIAGNOSIS — F419 Anxiety disorder, unspecified: Secondary | ICD-10-CM | POA: Insufficient documentation

## 2014-10-14 MED ORDER — OXYBUTYNIN CHLORIDE ER 5 MG PO TB24: 5.0000 mg | ORAL_TABLET | Freq: Every day | ORAL | Status: DC

## 2014-10-14 MED ORDER — ESTRADIOL 0.1 MG/GM VA CREA: 1.0000 | TOPICAL_CREAM | VAGINAL | Status: DC

## 2014-10-14 MED ORDER — NYSTATIN-TRIAMCINOLONE 100000-0.1 UNIT/GM-% EX CREA: 1.0000 "application " | TOPICAL_CREAM | Freq: Two times a day (BID) | CUTANEOUS | Status: DC

## 2014-10-14 NOTE — Addendum Note (Signed)
Addended by: Marchelle Folks on: 10/14/2014 04:53 PM   Modules accepted: Orders

## 2014-10-14 NOTE — Progress Notes (Signed)
Patient ID: Martha Hunter, female   DOB: [birth year], 81 y.o.   MRN: [id number] 6 month f/u vaginal atrophy Cystocele Unstable bladder  No estrace cream Taking oxybutynin- still wets beds  Chief complaint: 1.  Unstable bladder. 2.  Cystocele. 3.  Vaginal atrophy. 4.  Rectal discomfort   The patient recently moved into [place].  All 2 months ago.  Since that time, She has not been using her Estrace cream for vaginal atrophy. The patient is using oxybutynin 5 mg daily for control of unstable bladder symptoms; however, she has been having nighttime incontinence during sleep; she denies UTI symptoms.  She does have good control of her bladder during the day.  She states that she does not sense that she is voiding at nights. Patient reports rectal irritation with burning and itching; she feels like she has a bed sore  Past Medical History  Diagnosis Date  . Hypertension   . Hypercholesteremia   . Depression   . Staph infection   . CAP (community acquired pneumonia)     a. 07/2014.  Marland Kitchen History of TIA (transient ischemic attack)     a. on plavix chronically.  . Cystocele   . Rectocele   . Bladder spasm   . Hyperlipidemia   . Vaginal atrophy   . Bladder instability   . UTI (lower urinary tract infection)    Past Surgical History  Procedure Laterality Date  . None    . Dilation and curettage of uterus      OBJECTIVE: BP 157/74 mmHg  Pulse 65  Ht 5' (1.524 m)  Wt 144 lb 11.2 oz (65.635 kg)  BMI 28.26 kg/m2 Elderly female in no acute distress. Back: No CVA tenderness. Abdomen: Soft, nontender. Pelvic exam: External genitalia-normal BUS-normal Vagina-cystocele, first-degree; mild vaginal atrophy Cervix-normal. Uterus-normal; midplane, normal size and shape Adnexa-normal. Rectovaginal- perianal inflammation with epithelial skin breakdown in the midline posterior to the anus   IMPRESSION: 1.  Unstable bladder with recent onset of urine loss at night; cannot rule out  UTI. 2.  Vaginal atrophy-stable; needs estrogen cream.  Restart 3.  Perianal inflammation, possibly consistent with loose stools, or yeast infection  PLAN: 1.  Continue oxybutynin 5 mg at night. 2.  Nystatin and triamcinolone creams to be applied topically twice a day for 14 days. 3.  Start Desitin cream daily or twice a day to the perianal region. 4.  Resume Estrace cream, intravaginal 5 weekly. 5.  Return in 2 months for reassessment. 6.  UA with CNS

## 2014-10-14 NOTE — Patient Instructions (Signed)
1.  Continue oxybutynin 5 mg daily at bedtime. 2.  Continue with Estrace cream, 1 g intravaginal twice a week. 3.  Treat perirectal area with nystatin and triamcinolone creams twice a day for 2 weeks. 4.  Begin using Desitin ointment/cream once a day or twice a day as needed to the peri-rectal area. 5.  Return in 2 months for follow-up

## 2014-10-23 ENCOUNTER — Telehealth: Payer: Self-pay

## 2014-10-23 NOTE — Telephone Encounter (Signed)
Per Ashlyn at [facility] they recived pts u/a and culture results. Diamantina Monks sent results to her pcp. Pt was pos for uti- dr Madelin Rear gave her macrobid.

## 2014-12-17 ENCOUNTER — Ambulatory Visit: Payer: Medicare Other | Admitting: Obstetrics and Gynecology

## 2014-12-23 ENCOUNTER — Ambulatory Visit (INDEPENDENT_AMBULATORY_CARE_PROVIDER_SITE_OTHER): Payer: Medicare Other | Admitting: Obstetrics and Gynecology

## 2014-12-23 ENCOUNTER — Encounter: Payer: Self-pay | Admitting: Obstetrics and Gynecology

## 2014-12-23 VITALS — BP 176/77 | HR 62 | Ht 60.0 in | Wt 147.6 lb

## 2014-12-23 DIAGNOSIS — N762 Acute vulvitis: Secondary | ICD-10-CM | POA: Diagnosis not present

## 2014-12-23 DIAGNOSIS — N3289 Other specified disorders of bladder: Secondary | ICD-10-CM | POA: Diagnosis not present

## 2014-12-23 DIAGNOSIS — N8110 Cystocele, unspecified: Secondary | ICD-10-CM | POA: Diagnosis not present

## 2014-12-23 DIAGNOSIS — N952 Postmenopausal atrophic vaginitis: Secondary | ICD-10-CM

## 2014-12-23 DIAGNOSIS — IMO0002 Reserved for concepts with insufficient information to code with codable children: Secondary | ICD-10-CM

## 2014-12-23 NOTE — Patient Instructions (Signed)
1.  Continue to use the oxybutynin 5 mg at nightTo help reduce voiding at night. 2.  May stop Mycolog cream at this time as vulvar rash is resolved. 3.  May use Desitin ointment to the groin and vulva 4 times a day as needed to protect skin from urine and depends Protective wear. 4.  Return in 6 months for follow-up

## 2014-12-23 NOTE — Progress Notes (Signed)
Patient ID: Martha Hunter, female   DOB: [birth year], 81 y.o.   MRN: [id number] 2 month f/u on cystocele, vaginal atrophy, and unstable bladder  oxybutynin 5mg  qhs- nocturia 1 time a nite- no control of bladder at bedtime  estrace twice weekly ? perineal inflammation- Desitin- as needed- rash is still there   Chief complaint: 1.  Cystocele. 2.  Vaginal atrophy. 3.  Unstable bladder. 4.  Chronic vulvitis.  Patient presents for follow-up on above issues.  She has completed her course of Mycolog to the perineum.  She states that she still does have some occasional irritation in the groin and has been placing Desitin on that. Patient is continuing to use oxybutynin 5 mg at night to minimize nocturia to once or twice at night.  Past medical history, past surgical history: Past problem list, medications, and allergies are reviewed.  OBJECTIVE: BP 176/77 mmHg  Pulse 62  Ht 5' (1.524 m)  Wt 147 lb 9.6 oz (66.951 kg)  BMI 28.83 kg/m2 Pleasant elderly female in no acute distress. Abdomen: Soft, nontender. Pelvic exam: External genitalia-atrophic changes; no rash or lesions. BUS-normal. Vagina-atrophy. Groin-right side with mild hyperemia.  ASSESSMENT: 1.  Unstable bladder, improved on oxybutynin daily at bedtime 2.  Vulvitis-resolved 3.  Groin irritation, possibly related to depends use and incontinence  PLAN: 1.  Recommend Desitin use 3-4 times a day as needed to protect skin from urinary incontinence. 2.  Continue with oxybutynin 5 mg at night. 3.  Return in 6 months for follow-up.  A total of 15 minutes were spent face-to-face with the patient during this encounter and over half of that time dealt with counseling and coordination of care.  Herold HarmsMartin A Aryahna Spagna, MD  Note: This dictation was prepared with Dragon dictation along with smaller phrase technology. Any transcriptional errors that result from this process are unintentional.

## 2015-01-06 ENCOUNTER — Telehealth: Payer: Self-pay | Admitting: Obstetrics and Gynecology

## 2015-01-06 NOTE — Telephone Encounter (Signed)
RF gave a verbal order to [facility] (per mad). Faxed signed order to [facility] with confirmation. See scanned documents.

## 2015-01-06 NOTE — Telephone Encounter (Signed)
Belenda CruiseKristin needs some clarification on some orders that Dr Tommi Rumpse wrote

## 2015-05-27 ENCOUNTER — Emergency Department: Payer: Medicare Other

## 2015-05-27 ENCOUNTER — Encounter: Payer: Self-pay | Admitting: Emergency Medicine

## 2015-05-27 ENCOUNTER — Inpatient Hospital Stay
Admission: EM | Admit: 2015-05-27 | Discharge: 2015-05-30 | DRG: 071 | Disposition: A | Discharge status: Skilled Nursing Facility | Payer: Medicare Other | Attending: Internal Medicine | Admitting: Internal Medicine

## 2015-05-27 DIAGNOSIS — F329 Major depressive disorder, single episode, unspecified: Secondary | ICD-10-CM | POA: Diagnosis present

## 2015-05-27 DIAGNOSIS — Z22322 Carrier or suspected carrier of Methicillin resistant Staphylococcus aureus: Secondary | ICD-10-CM

## 2015-05-27 DIAGNOSIS — Z91018 Allergy to other foods: Secondary | ICD-10-CM | POA: Diagnosis not present

## 2015-05-27 DIAGNOSIS — I129 Hypertensive chronic kidney disease with stage 1 through stage 4 chronic kidney disease, or unspecified chronic kidney disease: Secondary | ICD-10-CM | POA: Diagnosis not present

## 2015-05-27 DIAGNOSIS — K219 Gastro-esophageal reflux disease without esophagitis: Secondary | ICD-10-CM | POA: Diagnosis not present

## 2015-05-27 DIAGNOSIS — R4182 Altered mental status, unspecified: Secondary | ICD-10-CM | POA: Diagnosis present

## 2015-05-27 DIAGNOSIS — Z881 Allergy status to other antibiotic agents status: Secondary | ICD-10-CM

## 2015-05-27 DIAGNOSIS — N183 Chronic kidney disease, stage 3 (moderate): Secondary | ICD-10-CM | POA: Diagnosis present

## 2015-05-27 DIAGNOSIS — Z79899 Other long term (current) drug therapy: Secondary | ICD-10-CM

## 2015-05-27 DIAGNOSIS — I6782 Cerebral ischemia: Secondary | ICD-10-CM | POA: Diagnosis not present

## 2015-05-27 DIAGNOSIS — Z7902 Long term (current) use of antithrombotics/antiplatelets: Secondary | ICD-10-CM | POA: Diagnosis not present

## 2015-05-27 DIAGNOSIS — R531 Weakness: Secondary | ICD-10-CM

## 2015-05-27 DIAGNOSIS — E785 Hyperlipidemia, unspecified: Secondary | ICD-10-CM | POA: Diagnosis present

## 2015-05-27 DIAGNOSIS — F419 Anxiety disorder, unspecified: Secondary | ICD-10-CM | POA: Diagnosis not present

## 2015-05-27 DIAGNOSIS — G9341 Metabolic encephalopathy: Secondary | ICD-10-CM | POA: Diagnosis not present

## 2015-05-27 DIAGNOSIS — Z91048 Other nonmedicinal substance allergy status: Secondary | ICD-10-CM | POA: Diagnosis not present

## 2015-05-27 DIAGNOSIS — Z8673 Personal history of transient ischemic attack (TIA), and cerebral infarction without residual deficits: Secondary | ICD-10-CM | POA: Diagnosis not present

## 2015-05-27 DIAGNOSIS — Z880 Allergy status to penicillin: Secondary | ICD-10-CM

## 2015-05-27 DIAGNOSIS — N179 Acute kidney failure, unspecified: Secondary | ICD-10-CM | POA: Diagnosis not present

## 2015-05-27 DIAGNOSIS — E7800 Pure hypercholesterolemia, unspecified: Secondary | ICD-10-CM | POA: Diagnosis not present

## 2015-05-27 DIAGNOSIS — R1111 Vomiting without nausea: Secondary | ICD-10-CM

## 2015-05-27 DIAGNOSIS — Z8249 Family history of ischemic heart disease and other diseases of the circulatory system: Secondary | ICD-10-CM

## 2015-05-27 DIAGNOSIS — Z87891 Personal history of nicotine dependence: Secondary | ICD-10-CM

## 2015-05-27 LAB — COMPREHENSIVE METABOLIC PANEL
ALBUMIN: 4.1 g/dL (ref 3.5–5.0)
ALT: 11 U/L — ABNORMAL LOW (ref 14–54)
ANION GAP: 7 (ref 5–15)
AST: 28 U/L (ref 15–41)
Alkaline Phosphatase: 75 U/L (ref 38–126)
BUN: 21 mg/dL — ABNORMAL HIGH (ref 6–20)
CO2: 29 mmol/L (ref 22–32)
Calcium: 9.6 mg/dL (ref 8.9–10.3)
Chloride: 100 mmol/L — ABNORMAL LOW (ref 101–111)
Creatinine, Ser: 1.2 mg/dL — ABNORMAL HIGH (ref 0.44–1.00)
GFR calc Af Amer: 47 mL/min — ABNORMAL LOW (ref >60)
GFR calc non Af Amer: 40 mL/min — ABNORMAL LOW (ref >60)
GLUCOSE: 109 mg/dL — AB (ref 65–99)
POTASSIUM: 4 mmol/L (ref 3.5–5.1)
Sodium: 136 mmol/L (ref 135–145)
TOTAL PROTEIN: 7.3 g/dL (ref 6.5–8.1)
Total Bilirubin: 0.8 mg/dL (ref 0.3–1.2)

## 2015-05-27 LAB — URINALYSIS COMPLETE WITH MICROSCOPIC (ARMC ONLY)
Bilirubin Urine: NEGATIVE
Glucose, UA: 50 mg/dL — AB
Ketones, ur: NEGATIVE mg/dL
Leukocytes, UA: NEGATIVE
Nitrite: NEGATIVE
PH: 6 (ref 5.0–8.0)
Protein, ur: >500 mg/dL — AB
Specific Gravity, Urine: 1.016 (ref 1.005–1.030)

## 2015-05-27 LAB — CBC WITH DIFFERENTIAL/PLATELET
BASOS PCT: 1 %
Basophils Absolute: 0.1 10*3/uL (ref 0–0.1)
EOS ABS: 0 10*3/uL (ref 0–0.7)
Eosinophils Relative: 0 %
HEMATOCRIT: 39.5 % (ref 35.0–47.0)
Hemoglobin: 13.1 g/dL (ref 12.0–16.0)
Lymphocytes Relative: 10 %
Lymphs Abs: 0.9 10*3/uL — ABNORMAL LOW (ref 1.0–3.6)
MCH: 27.1 pg (ref 26.0–34.0)
MCHC: 33.3 g/dL (ref 32.0–36.0)
MCV: 81.5 fL (ref 80.0–100.0)
MONO ABS: 0.4 10*3/uL (ref 0.2–0.9)
MONOS PCT: 5 %
Neutro Abs: 7.8 10*3/uL — ABNORMAL HIGH (ref 1.4–6.5)
Neutrophils Relative %: 84 %
Platelets: 191 10*3/uL (ref 150–440)
RBC: 4.84 MIL/uL (ref 3.80–5.20)
RDW: 14.9 % — AB (ref 11.5–14.5)
WBC: 9.2 10*3/uL (ref 3.6–11.0)

## 2015-05-27 LAB — TROPONIN I: Troponin I: 0.03 ng/mL (ref <0.031)

## 2015-05-27 MED ORDER — ENOXAPARIN SODIUM 30 MG/0.3ML ~~LOC~~ SOLN
30.0000 mg | SUBCUTANEOUS | Status: DC
  Administered 2015-05-28: 30 mg via SUBCUTANEOUS
  Filled 2015-05-27: qty 0.3

## 2015-05-27 MED ORDER — LOPERAMIDE HCL 2 MG PO CAPS: 2.0000 mg | ORAL_CAPSULE | Freq: Three times a day (TID) | ORAL | Status: DC | PRN

## 2015-05-27 MED ORDER — FERROUS SULFATE 325 (65 FE) MG PO TABS
325.0000 mg | ORAL_TABLET | Freq: Every day | ORAL | Status: DC
  Administered 2015-05-29 – 2015-05-30 (×2): 325 mg via ORAL
  Filled 2015-05-27 (×2): qty 1

## 2015-05-27 MED ORDER — ONDANSETRON HCL 4 MG/2ML IJ SOLN
4.0000 mg | Freq: Four times a day (QID) | INTRAMUSCULAR | Status: DC | PRN
  Administered 2015-05-28: 4 mg via INTRAVENOUS
  Filled 2015-05-27: qty 2

## 2015-05-27 MED ORDER — ALUM & MAG HYDROXIDE-SIMETH 200-200-20 MG/5ML PO SUSP: 30.0000 mL | ORAL | Status: DC | PRN

## 2015-05-27 MED ORDER — HYDRALAZINE HCL 50 MG PO TABS
50.0000 mg | ORAL_TABLET | Freq: Two times a day (BID) | ORAL | Status: DC
  Administered 2015-05-28 – 2015-05-30 (×6): 50 mg via ORAL
  Filled 2015-05-27 (×6): qty 1

## 2015-05-27 MED ORDER — VITAMIN D 1000 UNITS PO TABS
1000.0000 [IU] | ORAL_TABLET | Freq: Every day | ORAL | Status: DC
Start: 2015-05-28 — End: 2015-05-30
  Administered 2015-05-29 – 2015-05-30 (×2): 1000 [IU] via ORAL
  Filled 2015-05-27 (×2): qty 1

## 2015-05-27 MED ORDER — AMLODIPINE BESYLATE 5 MG PO TABS
5.0000 mg | ORAL_TABLET | Freq: Every day | ORAL | Status: DC
  Administered 2015-05-28 – 2015-05-29 (×3): 5 mg via ORAL
  Filled 2015-05-27 (×3): qty 1

## 2015-05-27 MED ORDER — ONDANSETRON HCL 4 MG PO TABS: 4.0000 mg | ORAL_TABLET | Freq: Three times a day (TID) | ORAL | Status: DC | PRN

## 2015-05-27 MED ORDER — SERTRALINE HCL 100 MG PO TABS
100.0000 mg | ORAL_TABLET | ORAL | Status: DC
  Administered 2015-05-28 – 2015-05-30 (×3): 100 mg via ORAL
  Filled 2015-05-27 (×3): qty 1

## 2015-05-27 MED ORDER — ACETAMINOPHEN 325 MG PO TABS
650.0000 mg | ORAL_TABLET | Freq: Four times a day (QID) | ORAL | Status: DC | PRN
  Filled 2015-05-27: qty 2

## 2015-05-27 MED ORDER — RISAQUAD PO CAPS
1.0000 | ORAL_CAPSULE | Freq: Every morning | ORAL | Status: DC
  Administered 2015-05-29 – 2015-05-30 (×2): 1 via ORAL
  Filled 2015-05-27 (×2): qty 1

## 2015-05-27 MED ORDER — SODIUM CHLORIDE 0.9 % IV BOLUS (SEPSIS)
500.0000 mL | Freq: Once | INTRAVENOUS | Status: AC
  Administered 2015-05-27: 500 mL via INTRAVENOUS

## 2015-05-27 MED ORDER — PRAVASTATIN SODIUM 20 MG PO TABS
20.0000 mg | ORAL_TABLET | Freq: Every evening | ORAL | Status: DC
  Administered 2015-05-27 – 2015-05-29 (×3): 20 mg via ORAL
  Filled 2015-05-27 (×3): qty 1

## 2015-05-27 MED ORDER — SODIUM CHLORIDE 0.9 % IV SOLN
INTRAVENOUS | Status: DC
  Administered 2015-05-27 – 2015-05-29 (×4): via INTRAVENOUS

## 2015-05-27 MED ORDER — ONDANSETRON HCL 4 MG PO TABS: 4.0000 mg | ORAL_TABLET | Freq: Four times a day (QID) | ORAL | Status: DC | PRN

## 2015-05-27 MED ORDER — CALCIUM CARBONATE ANTACID 500 MG PO CHEW: 1.0000 | CHEWABLE_TABLET | ORAL | Status: DC | PRN

## 2015-05-27 MED ORDER — VITAMIN D-3 25 MCG (1000 UT) PO CAPS: 1.0000 | ORAL_CAPSULE | Freq: Every day | ORAL | Status: DC

## 2015-05-27 MED ORDER — LOSARTAN POTASSIUM 50 MG PO TABS
100.0000 mg | ORAL_TABLET | ORAL | Status: DC
  Administered 2015-05-28 – 2015-05-30 (×3): 100 mg via ORAL
  Filled 2015-05-27 (×3): qty 2

## 2015-05-27 MED ORDER — ACETAMINOPHEN 650 MG RE SUPP: 650.0000 mg | Freq: Four times a day (QID) | RECTAL | Status: DC | PRN

## 2015-05-27 MED ORDER — PANTOPRAZOLE SODIUM 40 MG PO TBEC
40.0000 mg | DELAYED_RELEASE_TABLET | Freq: Every day | ORAL | Status: DC
  Administered 2015-05-28 – 2015-05-30 (×3): 40 mg via ORAL
  Filled 2015-05-27 (×3): qty 1

## 2015-05-27 MED ORDER — CLOPIDOGREL BISULFATE 75 MG PO TABS
75.0000 mg | ORAL_TABLET | ORAL | Status: DC
  Administered 2015-05-28 – 2015-05-30 (×3): 75 mg via ORAL
  Filled 2015-05-27 (×3): qty 1

## 2015-05-27 MED ORDER — DIAZEPAM 5 MG PO TABS
5.0000 mg | ORAL_TABLET | Freq: Every day | ORAL | Status: DC
  Administered 2015-05-28 – 2015-05-29 (×2): 5 mg via ORAL
  Filled 2015-05-27 (×2): qty 1

## 2015-05-27 NOTE — ED Notes (Signed)
Pt given sandwich tray 

## 2015-05-27 NOTE — Progress Notes (Signed)
Room air. NSR. Takes meds ok. Needs assistance with meals. Pt has no pain.Unable to walk. Pt has no further concerns at this time.

## 2015-05-27 NOTE — H&P (Signed)
[facility] - [place] at [facility]   PATIENT NAME: Martha Hunter    MR#:  [id number]  DATE OF BIRTH:  1929-01-16  DATE OF ADMISSION:  05/27/2015  PRIMARY CARE PHYSICIAN: No primary care provider on file.   REQUESTING/REFERRING PHYSICIAN: Dr. Ileana Roup  CHIEF COMPLAINT:   Chief Complaint  Patient presents with  . Altered Mental Status    HISTORY OF PRESENT ILLNESS:  Martha Hunter  is a 82 y.o. female with a known history of Hypertension, hyperlipidemia, depression, history of previous TIA, frequent UTIs, since the hospital from assisted living due to altered mental status and weakness. As per the son who gives most of the history patient with a past 2 days has been progressively weak globally she is normally able to walk with a walker but she needs full assistance to ambulate now. She is also been a bit more confused than usual as per her son. Patient usually gets like this when she has a urinary tract infection although her urinalysis here in the ER is negative. Patient also has had increase in tremors and shakes over the past 1-2 days. She has not started any new medications as per the son. Given her profound weakness and confusion hospitalist services were called in for treatment evaluation.  PAST MEDICAL HISTORY:   Past Medical History  Diagnosis Date  . Hypertension   . Hypercholesteremia   . Depression   . Staph infection   . CAP (community acquired pneumonia)     a. 07/2014.  Marland Kitchen History of TIA (transient ischemic attack)     a. on plavix chronically.  . Cystocele   . Rectocele   . Bladder spasm   . Hyperlipidemia   . Vaginal atrophy   . Bladder instability   . UTI (lower urinary tract infection)     PAST SURGICAL HISTORY:   Past Surgical History  Procedure Laterality Date  . None    . Dilation and curettage of uterus      SOCIAL HISTORY:   Social History  Substance Use Topics  . Smoking status: Former Games developer  . Smokeless tobacco:  Not on file  . Alcohol Use: No    FAMILY HISTORY:   Family History  Problem Relation Age of Onset  . CAD      parents and 9 siblings all died with CAD.  Marland Kitchen Heart disease Sister   . Heart disease Brother   . Cancer Neg Hx   . Diabetes Neg Hx     DRUG ALLERGIES:   Allergies  Allergen Reactions  . Niacin And Related   . Nitrates, Organic Other (See Comments)    Reaction: unknown  . Penicillins Other (See Comments)    Reaction: unknown  . Sulfa Antibiotics Other (See Comments)    Reaction: unknown     REVIEW OF SYSTEMS:   Review of Systems  Constitutional: Negative for fever and weight loss.  HENT: Negative for congestion, nosebleeds and tinnitus.   Eyes: Negative for blurred vision, double vision and redness.  Respiratory: Negative for cough, hemoptysis and shortness of breath.   Cardiovascular: Negative for chest pain, orthopnea, leg swelling and PND.  Gastrointestinal: Negative for nausea, vomiting, abdominal pain, diarrhea and melena.  Genitourinary: Negative for dysuria, urgency and hematuria.  Musculoskeletal: Negative for joint pain and falls.  Neurological: Positive for weakness (generalized). Negative for dizziness, tingling, sensory change, focal weakness, seizures and headaches.  Endo/Heme/Allergies: Negative for polydipsia. Does not bruise/bleed easily.  Psychiatric/Behavioral: Negative for depression  and memory loss. The patient is not nervous/anxious.     MEDICATIONS AT HOME:   Prior to Admission medications   Medication Sig Start Date End Date Taking? Authorizing Provider  acetaminophen (TYLENOL) 500 MG tablet Take 500-1,000 mg by mouth every 6 (six) hours as needed for mild pain.   Yes Historical Provider, MD  alum & mag hydroxide-simeth (MAALOX/MYLANTA) 200-200-20 MG/5ML suspension Take 30 mLs by mouth as needed for indigestion or heartburn.   Yes Historical Provider, MD  amLODipine (NORVASC) 5 MG tablet Take 5 mg by mouth at bedtime.    Yes Historical  Provider, MD  calcium carbonate (TUMS EX) 750 MG chewable tablet Chew 1 tablet by mouth every 4 (four) hours as needed for heartburn.   Yes Historical Provider, MD  Cholecalciferol (VITAMIN D-3) 1000 UNITS CAPS Take 1 capsule by mouth daily.    Yes Historical Provider, MD  clopidogrel (PLAVIX) 75 MG tablet Take 75 mg by mouth every morning.    Yes Historical Provider, MD  diazepam (VALIUM) 5 MG tablet Take 5 mg by mouth at bedtime.    Yes Historical Provider, MD  estradiol (ESTRACE VAGINAL) 0.1 MG/GM vaginal cream Place 1 Applicatorful vaginally 2 (two) times a week. 1/2 gram twice weekly. 10/14/14  Yes Prentice Docker Defrancesco, MD  ferrous sulfate 325 (65 FE) MG tablet Take 325 mg by mouth daily at 12 noon.    Yes Historical Provider, MD  hydrALAZINE (APRESOLINE) 50 MG tablet Take 50 mg by mouth 2 (two) times daily.    Yes Historical Provider, MD  loperamide (IMODIUM) 2 MG capsule Take 2 mg by mouth 3 (three) times daily as needed for diarrhea or loose stools.   Yes Historical Provider, MD  losartan (COZAAR) 100 MG tablet Take 100 mg by mouth every morning.    Yes Historical Provider, MD  omeprazole (PRILOSEC) 20 MG capsule Take 20 mg by mouth daily.   Yes Historical Provider, MD  ondansetron (ZOFRAN) 4 MG tablet Take 4 mg by mouth every 8 (eight) hours as needed for nausea or vomiting.   Yes Historical Provider, MD  pravastatin (PRAVACHOL) 20 MG tablet Take 20 mg by mouth every evening.  11/13/14  Yes Historical Provider, MD  Probiotic Product (PROBIOTIC DAILY PO) Take 1 tablet by mouth every morning.   Yes Historical Provider, MD  sertraline (ZOLOFT) 100 MG tablet Take 100 mg by mouth every morning.    Yes Historical Provider, MD  Zinc Oxide (DESITIN) 13 % CREA Apply topically. MAY USE TO THE GROIN AND VULVA FOUR TIMES DAILY AS NEEDED TO PROTECT SKIN FROM URINE AND DEPENDS PROTECTIVE WEAR.   Yes Historical Provider, MD      VITAL SIGNS:  Blood pressure 148/64, pulse 83, temperature 97.5 F (36.4  C), temperature source Oral, resp. rate 25, height 5' (1.524 m), weight 65.772 kg (145 lb), SpO2 94 %.  PHYSICAL EXAMINATION:  Physical Exam  GENERAL:  82 y.o.-year-old patient lying in the bed Mildly tremulous but in no apparent distress EYES: Pupils equal, round, reactive to light and accommodation. No scleral icterus. Extraocular muscles intact.  HEENT: Head atraumatic, normocephalic. Oropharynx and nasopharynx clear. No oropharyngeal erythema, moist oral mucosa  NECK:  Supple, no jugular venous distention. No thyroid enlargement, no tenderness.  LUNGS: Normal breath sounds bilaterally, no wheezing, rales, rhonchi. No use of accessory muscles of respiration.  CARDIOVASCULAR: S1, S2 RRR. No murmurs, rubs, gallops, clicks.  ABDOMEN: Soft, nontender, nondistended. Bowel sounds present. No organomegaly or mass.  EXTREMITIES: No  pedal edema, cyanosis, or clubbing. + 2 pedal & radial pulses b/l.   NEUROLOGIC: Cranial nerves II through XII are intact. No focal Motor or sensory deficits appreciated b/l.  Positive tremors PSYCHIATRIC: The patient is alert and oriented x 1.  SKIN: No obvious rash, lesion, or ulcer.   LABORATORY PANEL:   CBC  Recent Labs Lab 05/27/15 1036  WBC 9.2  HGB 13.1  HCT 39.5  PLT 191   ------------------------------------------------------------------------------------------------------------------  Chemistries   Recent Labs Lab 05/27/15 1036  NA 136  K 4.0  CL 100*  CO2 29  GLUCOSE 109*  BUN 21*  CREATININE 1.20*  CALCIUM 9.6  AST 28  ALT 11*  ALKPHOS 75  BILITOT 0.8   ------------------------------------------------------------------------------------------------------------------  Cardiac Enzymes  Recent Labs Lab 05/27/15 1036  TROPONINI 0.03   ------------------------------------------------------------------------------------------------------------------  RADIOLOGY:  Dg Chest 2 View  05/27/2015  CLINICAL DATA:  82 year old female  with weakness and vomiting today. Initial encounter. EXAM: CHEST  2 VIEW COMPARISON:  07/20/2014 and earlier. FINDINGS: Stable cardiomegaly and mediastinal contours. No pneumothorax, pulmonary edema, pleural effusion or confluent pulmonary opacity. Stable mild increased interstitial markings. No pneumoperitoneum. Paucity of bowel gas in the upper abdomen. Osteopenia. No acute osseous abnormality identified. Calcified aortic atherosclerosis. IMPRESSION: No acute cardiopulmonary abnormality. Electronically Signed   By: Odessa FlemingH  Hall M.D.   On: 05/27/2015 13:22   Ct Head Wo Contrast  05/27/2015  CLINICAL DATA:  Confusion. Recent treatment for urinary tract infection. Vomiting and weakness. EXAM: CT HEAD WITHOUT CONTRAST TECHNIQUE: Contiguous axial images were obtained from the base of the skull through the vertex without intravenous contrast. COMPARISON:  CT 01/09/2013. FINDINGS: Brain: There is no evidence of acute intracranial hemorrhage, mass lesion, brain edema or extra-axial fluid collection. The ventricles and subarachnoid spaces are mildly prominent but stable. There is no CT evidence of acute cortical infarction. Chronic small vessel ischemic changes in the periventricular white matter and basal ganglia are unchanged. Prominent intracranial vascular calcifications are present. Bones/sinuses/visualized face: The visualized paranasal sinuses, mastoid air cells and middle ears are clear. The calvarium is intact. IMPRESSION: Stable head CT demonstrating no acute intracranial findings. Stable atrophy and chronic small vessel ischemic changes. Electronically Signed   By: Carey BullocksWilliam  Veazey M.D.   On: 05/27/2015 11:33     IMPRESSION AND PLAN:   82 year old female with past medical history of hypertension, hyperlipidemia, history of previous TIA, history of frequent UTIs, depression who presents to the hospital due to altered mental status.  1. Altered mental status-likely metabolic encephalopathy but source  unclear. -Patient's CT head is negative for any acute pathology. Her urinalysis, chest x-ray is negative for any acute pathology. Her electrolytes are stable. -We'll get neurology consult given her tremor, will also get physical therapy consult to assess mobility.  2. Generalized weakness-physical therapy eval to assess mobility.  3. Acute renal failure-secondary to poor by mouth intake and dehydration. -Hydrate with IV fluids follow BUN/creatinine.  4. Essential hypertension-continue Norvasc, losartan, hydralazine.  5. GERD-continue Protonix.  6. Hyperlipidemia-continue Pravachol.  7. Depression-continue Zoloft.  8. History of previous TIA-continue Plavix.   All the records are reviewed and case discussed with ED provider. Management plans discussed with the patient, family and they are in agreement.  CODE STATUS: Full  TOTAL TIME TAKING CARE OF THIS PATIENT: 45 minutes.    Houston SirenSAINANI,VIVEK J M.D on 05/27/2015 at 2:06 PM  Between 7am to 6pm - Pager - [phone]  After 6pm go to [email or web address] - password [email or web address] [facility]  [place]  [place] [facility]  Office  [phone]  CC: Primary care physician; No primary care provider on file.

## 2015-05-27 NOTE — ED Notes (Signed)
Pt given ginger ale to drink, ok per dr. Alphonzo Lemmingsmcshane

## 2015-05-27 NOTE — ED Provider Notes (Addendum)
[facility] Emergency Department Provider Note  ____________________________________________   I have reviewed the triage vital signs and the nursing notes.   HISTORY  Chief Complaint Altered Mental Status    HPI Martha Hunter is a 82 y.o. female who presents today complaining of feeling generally weak. Her son is with her and presents a history. She stats her baseline mentally today. There is no actual altered mental status. The son and the patient her abdomen about this. She has had however in the past altered mental status with urinary tract infections as recently as 2 weeks ago, and this is aware of the son states the confusion about altered mental status happened. The complaint as actually the patient is generally weak and not taking by mouth. The patient has had 2 episodes of vomiting today, no abdominal pain. Positive slight cough no shortness of breath. No dysuria or urinary frequency that she knows of. Patient does suffer some baseline dementia. The patient has no headache or stiff neck no fallen she denies focal numbness or weakness. According to her son, on Monday he thought that she is walking more slowly than normal although sometimes that happens with no significant dementia leading cause as a normal variant for her but then yesterday she seemed be walking slowly as well.     Past Medical History  Diagnosis Date  . Hypertension   . Hypercholesteremia   . Depression   . Staph infection   . CAP (community acquired pneumonia)     a. 07/2014.  Marland Kitchen History of TIA (transient ischemic attack)     a. on plavix chronically.  . Cystocele   . Rectocele   . Bladder spasm   . Hyperlipidemia   . Vaginal atrophy   . Bladder instability   . UTI (lower urinary tract infection)     Patient Active Problem List   Diagnosis Date Noted  . Anxiety 10/14/2014  . Clinical depression 10/14/2014  . Dizziness 10/14/2014  . HLD (hyperlipidemia) 10/14/2014  . BP (high  blood pressure) 10/14/2014  . Adiposity 10/14/2014  . Unstable bladder 10/14/2014  . Cystocele 10/14/2014  . Vaginal atrophy 10/14/2014  . CAP (community acquired pneumonia) 07/23/2014  . Chronic kidney disease (CKD), stage III (moderate) 04/07/2014    Past Surgical History  Procedure Laterality Date  . None    . Dilation and curettage of uterus      Current Outpatient Rx  Name  Route  Sig  Dispense  Refill  . acetaminophen (TYLENOL) 325 MG tablet   Oral   Take 650 mg by mouth every 6 (six) hours as needed.         Marland Kitchen amLODipine (NORVASC) 5 MG tablet   Oral   Take 5 mg by mouth at bedtime.          . Cholecalciferol (VITAMIN D-3) 1000 UNITS CAPS   Oral   Take 1 capsule by mouth daily.          . clopidogrel (PLAVIX) 75 MG tablet   Oral   Take 75 mg by mouth every morning.          . diazepam (VALIUM) 5 MG tablet   Oral   Take 5 mg by mouth daily as needed for anxiety.          Marland Kitchen estradiol (ESTRACE VAGINAL) 0.1 MG/GM vaginal cream   Vaginal   Place 1 Applicatorful vaginally 2 (two) times a week. 1/2 gram twice weekly.   42.5 g  12   . ferrous sulfate 325 (65 FE) MG tablet   Oral   Take 325 mg by mouth daily with breakfast.         . hydrALAZINE (APRESOLINE) 50 MG tablet   Oral   Take 100 mg by mouth 2 (two) times daily.          Marland Kitchen losartan (COZAAR) 100 MG tablet   Oral   Take 100 mg by mouth every morning.          . nystatin-triamcinolone (MYCOLOG II) cream   Topical   Apply 1 application topically 2 (two) times daily.   30 g   0   . ondansetron (ZOFRAN) 4 MG tablet   Oral   Take 4 mg by mouth every 8 (eight) hours as needed for nausea or vomiting.         Marland Kitchen oxybutynin (DITROPAN-XL) 5 MG 24 hr tablet   Oral   Take 1 tablet (5 mg total) by mouth at bedtime.   30 tablet   6   . pravastatin (PRAVACHOL) 20 MG tablet   Oral   Take by mouth.         . Probiotic Product (PROBIOTIC DAILY PO)   Oral   Take 1 tablet by mouth every  morning.         . sertraline (ZOLOFT) 100 MG tablet   Oral   Take 100 mg by mouth every morning.            Allergies Niacin and related; Nitrates, organic; Penicillins; and Sulfa antibiotics  Family History  Problem Relation Age of Onset  . CAD      parents and 9 siblings all died with CAD.  Marland Kitchen Heart disease Sister   . Heart disease Brother   . Cancer Neg Hx   . Diabetes Neg Hx     Social History Social History  Substance Use Topics  . Smoking status: Former Games developer  . Smokeless tobacco: None  . Alcohol Use: No    Review of Systems }Constitutional: No fever/chills Eyes: No visual changes. ENT: No sore throat. No stiff neck no neck pain Cardiovascular: Denies chest pain. Respiratory: Denies shortness of breath.No productive cough Gastrointestinal:   Positive vomiting.  No diarrhea.  No constipation. Genitourinary: Negative for dysuria. Musculoskeletal: Negative lower extremity swelling Skin: Negative for rash. Neurological: Negative for headaches, focal weakness or numbness. 10-point ROS otherwise negative.  ____________________________________________   PHYSICAL EXAM:  VITAL SIGNS: ED Triage Vitals  Enc Vitals Group     BP 05/27/15 1020 161/74 mmHg     Pulse Rate 05/27/15 1020 84     Resp 05/27/15 1020 18     Temp 05/27/15 1020 97.5 F (36.4 C)     Temp Source 05/27/15 1020 Oral     SpO2 05/27/15 1020 95 %     Weight 05/27/15 1020 145 lb (65.772 kg)     Height 05/27/15 1020 5' (1.524 m)     Head Cir --      Peak Flow --      Pain Score --      Pain Loc --      Pain Edu? --      Excl. in GC? --     Constitutional: Alert and orientedTo name place and year unsure of the exact area of the week which is baseline per her son. Well appearing and in no acute distress. Eyes: Conjunctivae are normal. PERRL. EOMI. Head: Atraumatic. Nose: No congestion/rhinnorhea. Mouth/Throat:  Mucous membranes are moist.  Oropharynx non-erythematous. Neck: No stridor.    Nontender with no meningismus Cardiovascular: Normal rate, regular rhythm. Grossly normal heart sounds.  Good peripheral circulation. Respiratory: Normal respiratory effort.  No retractions. Lungs CTAB. Abdominal: Soft and nontender. No distention. No guarding no rebound Back:  There is no focal tenderness or step off there is no midline tenderness there are no lesions noted. there is no CVA tenderness Musculoskeletal: No lower extremity tenderness. No joint effusions, no DVT signs strong distal pulses no edema Neurologic:  Normal speech and language. No gross focal neurologic deficits are appreciated. Cranial nerves are intact grossly. There is diffuse weakness but no obvious focality Skin:  Skin is warm, dry and intact. No rash noted. Psychiatric: Mood and affect are normal. Speech and behavior are normal.  ____________________________________________   LABS (all labs ordered are listed, but only abnormal results are displayed)  Labs Reviewed  URINE CULTURE  URINALYSIS COMPLETEWITH MICROSCOPIC ([facility] ONLY)  CBC WITH DIFFERENTIAL/PLATELET  COMPREHENSIVE METABOLIC PANEL  TROPONIN I   ____________________________________________  EKG  I personally interpreted any EKGs ordered by me or triage  ____________________________________________  RADIOLOGY  I reviewed any imaging ordered by me or triage that were performed during my shift and, if possible, patient and/or family made aware of any abnormal findings. ____________________________________________   PROCEDURES  Procedure(s) performed: None  Critical Care performed: None  ____________________________________________   INITIAL IMPRESSION / ASSESSMENT AND PLAN / ED COURSE  Pertinent labs & imaging results that were available during my care of the patient were reviewed by me and considered in my medical decision making (see chart for details).  Patient with generalized weakness, As well as vomiting, history of TIA  certainly many different possible etiologies including urinary tract infection or dehydration. She did also have some vomiting. She is nonfocal neurologic exam and her symptoms started 2 days ago which makes her not to candidate for TPA units this is secondary to a CVA. We will obtain a CT of her head as a precaution. We'll check a urine and blood work urine culture EKG and reassess.  ----------------------------------------- 1:11 PM on 05/27/2015 -----------------------------------------  Patient remains nonfocal. The son is adamant that she is different from her baseline and. Workup is reassuring thus far. We are hydrating her. I will admit her to the hospitalist for further evaluation given the acute change in her abilities. ____________________________________________   FINAL CLINICAL IMPRESSION(S) / ED DIAGNOSES  Final diagnoses:  None      This chart was dictated using voice recognition software.  Despite best efforts to proofread,  errors can occur which can change meaning.     Jeanmarie PlantJames A McShane, MD 05/27/15 1039  Jeanmarie PlantJames A McShane, MD 05/27/15 1311

## 2015-05-27 NOTE — ED Notes (Signed)
Patient to ER for confusion. Son states patient had UTI two weeks ago, finished antibiotics. Developed AMS yesterday. Vomited x1 this am.

## 2015-05-27 NOTE — ED Notes (Signed)
Pt grandson Martha Hunter can be reached at [phone]

## 2015-05-27 NOTE — Progress Notes (Signed)
Order for enoxaparin 40 mg subcutaneously daily for DVT prophylaxis was changed to 30 mg dose per anticoagulation protocol for CrCl < 30 mL/min.  Cindi CarbonMary M Aqil Goetting, PharmD Clinical Pharmacist 05/27/2015 3:36 PM

## 2015-05-28 DIAGNOSIS — G9341 Metabolic encephalopathy: Secondary | ICD-10-CM | POA: Diagnosis not present

## 2015-05-28 DIAGNOSIS — R410 Disorientation, unspecified: Secondary | ICD-10-CM | POA: Diagnosis not present

## 2015-05-28 LAB — BASIC METABOLIC PANEL
ANION GAP: 9 (ref 5–15)
BUN: 19 mg/dL (ref 6–20)
CHLORIDE: 102 mmol/L (ref 101–111)
CO2: 25 mmol/L (ref 22–32)
Calcium: 9 mg/dL (ref 8.9–10.3)
Creatinine, Ser: 0.94 mg/dL (ref 0.44–1.00)
GFR calc Af Amer: >60 mL/min (ref >60)
GFR, EST NON AFRICAN AMERICAN: 54 mL/min — AB (ref >60)
GLUCOSE: 100 mg/dL — AB (ref 65–99)
POTASSIUM: 3.2 mmol/L — AB (ref 3.5–5.1)
Sodium: 136 mmol/L (ref 135–145)

## 2015-05-28 LAB — CBC
HEMATOCRIT: 38.1 % (ref 35.0–47.0)
HEMOGLOBIN: 12.6 g/dL (ref 12.0–16.0)
MCH: 27.5 pg (ref 26.0–34.0)
MCHC: 33.2 g/dL (ref 32.0–36.0)
MCV: 82.8 fL (ref 80.0–100.0)
Platelets: 151 10*3/uL (ref 150–440)
RBC: 4.6 MIL/uL (ref 3.80–5.20)
RDW: 14.8 % — ABNORMAL HIGH (ref 11.5–14.5)
WBC: 7.2 10*3/uL (ref 3.6–11.0)

## 2015-05-28 MED ORDER — POTASSIUM CHLORIDE CRYS ER 20 MEQ PO TBCR
40.0000 meq | EXTENDED_RELEASE_TABLET | Freq: Once | ORAL | Status: AC
  Administered 2015-05-28: 40 meq via ORAL
  Filled 2015-05-28: qty 2

## 2015-05-28 MED ORDER — ENOXAPARIN SODIUM 40 MG/0.4ML ~~LOC~~ SOLN
40.0000 mg | SUBCUTANEOUS | Status: DC
  Administered 2015-05-28 – 2015-05-29 (×2): 40 mg via SUBCUTANEOUS
  Filled 2015-05-28 (×2): qty 0.4

## 2015-05-28 NOTE — Evaluation (Signed)
Physical Therapy Evaluation Patient Details Name: Martha DialsRuth Hunter MRN: [id number] DOB: [birth year] Today's Date: 05/28/2015   History of Present Illness  Pt admitted for complaints of AMS. Pt with history of HTN, hyperlipdemia, and UTI. Pt currently lives at ALF.  Clinical Impression  Pt is a pleasant 82 year old female who was admitted for complaints of AMS. Pt performs bed mobility/transfers with mod assist +2 and ambulation with max assist +2. Pt demonstrates deficits with strength/ambulation/mobility/cognition. Pt with heavy R side leaning, propped up with pillows once in chair as she is unable to maintain posture. Pt with no sensation deficits or coordination deficits at this time. Confused to situation.  Pt is not at baseline level. Would benefit from skilled PT to address above deficits and promote optimal return to PLOF; recommend transition to STR upon discharge from acute hospitalization.      Follow Up Recommendations SNF    Equipment Recommendations       Recommendations for Other Services       Precautions / Restrictions Precautions Precautions: Fall Restrictions Weight Bearing Restrictions: No      Mobility  Bed Mobility Overal bed mobility: Needs Assistance Bed Mobility: Supine to Sit     Supine to sit: Mod assist;+2 for physical assistance     General bed mobility comments: assist for transfer to EOB. Heavy assist required for transfer including trunk stabilization. Once seated at EOB, pt with heavy R side leaning. Pt unable to self correct, and has poor proprioception.  Transfers Overall transfer level: Needs assistance Equipment used: Rolling walker (2 wheeled) Transfers: Sit to/from Stand Sit to Stand: Mod assist;+2 physical assistance         General transfer comment: assist for transfer. Pt with heavy R side leaning, needing heavy assist to self correct. Once standing, pt with narrow base of support with assist for upright  posture.  Ambulation/Gait Ambulation/Gait assistance: +2 physical assistance;Max assist Ambulation Distance (Feet): 3 Feet Assistive device: Rolling walker (2 wheeled) Gait Pattern/deviations: Step-to pattern;Decreased weight shift to left;Narrow base of support     General Gait Details: ambulated to recliner with heavy + 2 assist. Cues and physical assist needed to move rw and keep closer to body. Pt fatigues quickly with ambulation  Stairs            Wheelchair Mobility    Modified Rankin (Stroke Patients Only)       Balance Overall balance assessment: History of Falls;Needs assistance Sitting-balance support: Bilateral upper extremity supported;Feet supported Sitting balance-Leahy Scale: Poor   Postural control: Right lateral lean Standing balance support: Bilateral upper extremity supported Standing balance-Leahy Scale: Poor                               Pertinent Vitals/Pain Pain Assessment: No/denies pain    Home Living Family/patient expects to be discharged to:: Assisted living               Home Equipment:  Nature conservation officer(rollater)      Prior Function Level of Independence: Independent with assistive device(s)               Hand Dominance        Extremity/Trunk Assessment   Upper Extremity Assessment: Generalized weakness (R LE grossly 3/5; L LE grossly 3+/5;)           Lower Extremity Assessment: Generalized weakness (B LE grossly 3+/5)         Communication  Communication: No difficulties  Cognition Arousal/Alertness: Awake/alert Behavior During Therapy: Flat affect Overall Cognitive Status: Impaired/Different from baseline                      General Comments      Exercises Other Exercises Other Exercises: Supine ther-ex performed on B LE including quad sets, SLRs, and hip abd/add. All ther-ex x 10 with min/mod assist. Cues given for correct technique.  Other Exercises: Once seated in recliner, pt had BM.  Pt then transferred to standing, able to stand approx 5 minutes during hygiene cleaning. Mod assist +2 for static balance and additional person to assist for cleaning.      Assessment/Plan    PT Assessment Patient needs continued PT services  PT Diagnosis Difficulty walking;Abnormality of gait;Generalized weakness   PT Problem List Decreased strength;Decreased activity tolerance;Decreased balance;Decreased mobility  PT Treatment Interventions Gait training;DME instruction;Therapeutic exercise   PT Goals (Current goals can be found in the Care Plan section) Acute Rehab PT Goals Patient Stated Goal: to get to rehab PT Goal Formulation: With patient Time For Goal Achievement: 06/11/15 Potential to Achieve Goals: Good    Frequency Min 2X/week   Barriers to discharge        Co-evaluation               End of Session Equipment Utilized During Treatment: Gait belt Activity Tolerance: Patient limited by fatigue Patient left: in chair;with chair alarm set;with family/visitor present Nurse Communication: Mobility status    Functional Assessment Tool Used: clinical judgement Functional Limitation: Mobility: Walking and moving around Mobility: Walking and Moving Around Current Status [phone]): At least 60 percent but less than 80 percent impaired, limited or restricted Mobility: Walking and Moving Around Goal Status [phone]): At least 40 percent but less than 60 percent impaired, limited or restricted    Time: 1409-1450 PT Time Calculation (min) (ACUTE ONLY): 41 min   Charges:   PT Evaluation $PT Eval Moderate Complexity: 1 Procedure PT Treatments $Therapeutic Exercise: 8-22 mins $Therapeutic Activity: 8-22 mins   PT G Codes:   PT G-Codes **NOT FOR INPATIENT CLASS** Functional Assessment Tool Used: clinical judgement Functional Limitation: Mobility: Walking and moving around Mobility: Walking and Moving Around Current Status (W2956): At least 60 percent but less than 80  percent impaired, limited or restricted Mobility: Walking and Moving Around Goal Status [phone]): At least 40 percent but less than 60 percent impaired, limited or restricted    Martha Hunter 05/28/2015, 4:44 PM Martha Hunter, PT, DPT [phone]

## 2015-05-28 NOTE — Progress Notes (Signed)
Pt vomited, Zofran 4mg  IV given, will monitor.

## 2015-05-28 NOTE — Plan of Care (Signed)
Problem: Safety: Goal: Ability to remain free from injury will improve Outcome: Progressing Fall precautions in place  Problem: Pain Managment: Goal: General experience of comfort will improve Outcome: Progressing Prn medications  Problem: Physical Regulation: Goal: Ability to maintain clinical measurements within normal limits will improve Outcome: Not Progressing PT working with pt  Problem: Tissue Perfusion: Goal: Risk factors for ineffective tissue perfusion will decrease Outcome: Progressing SQ Lovenox

## 2015-05-28 NOTE — Progress Notes (Signed)
Assessment completed.  Alert, oriented to person only, reoriented to place, time and situation.  Denies pain or SOB at this time. CB In reach, SR up x3, bed alarm on.

## 2015-05-28 NOTE — Care Management Obs Status (Signed)
MEDICARE OBSERVATION STATUS NOTIFICATION   Patient Details  Name: Martha Hunter MRN: [id number] Date of Birth: 1929/02/11   Medicare Observation Status Notification Given:  Yes    Marily MemosLisa M Nethaniel Mattie, RN 05/28/2015, 9:58 AM

## 2015-05-28 NOTE — Care Management (Signed)
CM assessment for discharge planning. Met with patient's daughter. Patient is from [facility]. She uses a walker and is usually fairly independent per the daughter. She receives no home health services. No O2. PT consult pending. Daughter is agreeable to SNF if recommended. CSW consult pending.

## 2015-05-28 NOTE — Progress Notes (Signed)
Cardiac monitor discontinued per md orders 

## 2015-05-28 NOTE — Progress Notes (Signed)
Martha Hunter - [place] at [facility]   PATIENT NAME: Martha Hunter    MRN#:  [id number]  DATE OF BIRTH:  12/18/1928  SUBJECTIVE:  Hospital Day: none Martha Hunter is a 82 y.o. female presenting with Altered Mental Status .   Overnight events: No overnight events Interval Events: Actually some improvement in mental status which appears to be closer to baseline however still remains markedly weak, family members present at bedside  REVIEW OF SYSTEMS:  CONSTITUTIONAL: No fever, positive fatigue or weakness.  EYES: No blurred or double vision.  EARS, NOSE, AND THROAT: No tinnitus or ear pain.  RESPIRATORY: No cough, shortness of breath, wheezing or hemoptysis.  CARDIOVASCULAR: No chest pain, orthopnea, edema.  GASTROINTESTINAL: Positive nausea, vomiting, denies diarrhea or abdominal pain.  GENITOURINARY: No dysuria, hematuria.  ENDOCRINE: No polyuria, nocturia,  HEMATOLOGY: No anemia, easy bruising or bleeding SKIN: No rash or lesion. MUSCULOSKELETAL: No joint pain or arthritis.   NEUROLOGIC: No tingling, numbness, weakness.  PSYCHIATRY: No anxiety or depression.   DRUG ALLERGIES:   Allergies  Allergen Reactions  . Niacin And Related   . Nitrates, Organic Other (See Comments)    Reaction: unknown  . Penicillins Other (See Comments)    Reaction: unknown  . Sulfa Antibiotics Other (See Comments)    Reaction: unknown     VITALS:  Blood pressure 133/64, pulse 76, temperature 98.5 F (36.9 C), temperature source Oral, resp. rate 16, height 5' (1.524 m), weight 63.141 kg (139 lb 3.2 oz), SpO2 94 %.  PHYSICAL EXAMINATION:  VITAL SIGNS: Filed Vitals:   05/28/15 0722 05/28/15 1115  BP: 161/86 133/64  Pulse: 75 76  Temp: 98.6 F (37 C) 98.5 F (36.9 C)  Resp: 17 16   GENERAL:82 y.o.female currently in no acute distress.  HEAD: Normocephalic, atraumatic.  EYES: Pupils equal, round, reactive to light. Extraocular muscles intact. No scleral icterus.   MOUTH: Moist mucosal membrane. Dentition intact. No abscess noted.  EAR, NOSE, THROAT: Clear without exudates. No external lesions.  NECK: Supple. No thyromegaly. No nodules. No JVD.  PULMONARY: Clear to ascultation, without wheeze rails or rhonci. No use of accessory muscles, Good respiratory effort. good air entry bilaterally CHEST: Nontender to palpation.  CARDIOVASCULAR: S1 and S2. Regular rate and rhythm. No murmurs, rubs, or gallops. No edema. Pedal pulses 2+ bilaterally.  GASTROINTESTINAL: Soft, nontender, nondistended. No masses. Positive bowel sounds. No hepatosplenomegaly.  MUSCULOSKELETAL: No swelling, clubbing, or edema. Range of motion full in all extremities.  NEUROLOGIC: Cranial nerves II through XII are intact. Diffuse weakness 4/5 all extremities including proximal and distal flexion and extension. Sensation intact. Reflexes intact.  SKIN: No ulceration, lesions, rashes, or cyanosis. Skin warm and dry. Turgor intact.  PSYCHIATRIC: Mood, affect within normal limits. The patient is awake, alert and oriented x 3. Insight, judgment intact.      LABORATORY PANEL:   CBC  Recent Labs Lab 05/28/15 0428  WBC 7.2  HGB 12.6  HCT 38.1  PLT 151   ------------------------------------------------------------------------------------------------------------------  Chemistries   Recent Labs Lab 05/27/15 1036 05/28/15 0428  NA 136 136  K 4.0 3.2*  CL 100* 102  CO2 29 25  GLUCOSE 109* 100*  BUN 21* 19  CREATININE 1.20* 0.94  CALCIUM 9.6 9.0  AST 28  --   ALT 11*  --   ALKPHOS 75  --   BILITOT 0.8  --    ------------------------------------------------------------------------------------------------------------------  Cardiac Enzymes  Recent Labs Lab 05/27/15 1036  TROPONINI 0.03   ------------------------------------------------------------------------------------------------------------------  RADIOLOGY:  Dg Chest 2 View  05/27/2015  CLINICAL DATA:   82 year old female with weakness and vomiting today. Initial encounter. EXAM: CHEST  2 VIEW COMPARISON:  07/20/2014 and earlier. FINDINGS: Stable cardiomegaly and mediastinal contours. No pneumothorax, pulmonary edema, pleural effusion or confluent pulmonary opacity. Stable mild increased interstitial markings. No pneumoperitoneum. Paucity of bowel gas in the upper abdomen. Osteopenia. No acute osseous abnormality identified. Calcified aortic atherosclerosis. IMPRESSION: No acute cardiopulmonary abnormality. Electronically Signed   By: Odessa FlemingH  Hall M.D.   On: 05/27/2015 13:22   Ct Head Wo Contrast  05/27/2015  CLINICAL DATA:  Confusion. Recent treatment for urinary tract infection. Vomiting and weakness. EXAM: CT HEAD WITHOUT CONTRAST TECHNIQUE: Contiguous axial images were obtained from the base of the skull through the vertex without intravenous contrast. COMPARISON:  CT 01/09/2013. FINDINGS: Brain: There is no evidence of acute intracranial hemorrhage, mass lesion, brain edema or extra-axial fluid collection. The ventricles and subarachnoid spaces are mildly prominent but stable. There is no CT evidence of acute cortical infarction. Chronic small vessel ischemic changes in the periventricular white matter and basal ganglia are unchanged. Prominent intracranial vascular calcifications are present. Bones/sinuses/visualized face: The visualized paranasal sinuses, mastoid air cells and middle ears are clear. The calvarium is intact. IMPRESSION: Stable head CT demonstrating no acute intracranial findings. Stable atrophy and chronic small vessel ischemic changes. Electronically Signed   By: Carey BullocksWilliam  Veazey M.D.   On: 05/27/2015 11:33    EKG:   Orders placed or performed during the hospital encounter of 05/27/15  . ED EKG  . ED EKG    ASSESSMENT AND PLAN:   Martha Hunter is a 82 y.o. female presenting with Altered Mental Status . Admitted 05/27/2015 : Day #: none    1. metabolic encephalopathy but source  unclear. -We'll get neurology consult given her tremor, will also get physical therapy consult to assess mobility.  2. Generalized weakness-physical therapy eval to assess mobility.  3. Acute kidney injury-improved  4. Essential hypertension-continue Norvasc, losartan, hydralazine.  5. GERD-continue Protonix.  6. Hyperlipidemia-continue Pravachol.  7. Depression-continue Zoloft.  8. History of previous TIA-continue Plavix.   All the records are reviewed and case discussed with Care Management/Social Workerr. Management plans discussed with the patient, family and they are in agreement.  CODE STATUS: full TOTAL TIME TAKING CARE OF THIS PATIENT: 28 minutes.   POSSIBLE D/C IN 1-2DAYS, DEPENDING ON CLINICAL CONDITION.   Lilyauna Miedema,  Mardi MainlandDavid K M.D on 05/28/2015 at 12:04 PM  Between 7am to 6pm - Pager - [phone]  After 6pm: House Pager: - [phone]  [facility]  Office  [phone]  CC: Primary care physician; No primary care provider on file.

## 2015-05-28 NOTE — Progress Notes (Signed)
Pt more alert, oriented to person, place and time.  No neuro deficit noted at this time.

## 2015-05-28 NOTE — Clinical Social Work Note (Signed)
Clinical Social Work Assessment  Patient Details  Name: Martha Hunter MRN: [id number] Date of Birth: 30-Jun-1928  Date of referral:  05/28/15               Reason for consult:  Facility Placement                Permission sought to share information with:  Case Manager, Customer service manager, Family Supports Permission granted to share information::  Yes, Verbal Permission Granted  Name::        Agency::  SNF referral in [place]  Relationship::  Daughter and son, daughter at bedside during assessment  Contact Information:     Housing/Transportation Living arrangements for the past 2 months:  [place] Location manager) Source of Information:  Patient, Scientist, water quality, Adult Children Patient Interpreter Needed:  None Criminal Activity/Legal Involvement Pertinent to Current Situation/Hospitalization:  No - Comment as needed Significant Relationships:  Adult Children, Other Family Members, Community Support Lives with:  Self, Facility Resident Do you feel safe going back to the place where you live?  No (needing higher level of care for ST rehab) Need for family participation in patient care:  Yes (Comment) (per request)  Care giving concerns:  Patient is a current resident at [facility] where she has been living since summer.  Patient at this time needing higher level of care due to weakness and patient unable to reach indpendence prior to admission/baseline.  Family at bedside assisting patient with decisions and report wanting SNF placement at [place].  Agreeable to begin workup   Social Worker assessment / plan:  LCSW met with patient and daughter at the bedside. Discussed role and consult. Agreeable to SNF in [place]. Daughter reports she lives out of town, but her brother will help make decision regarding placement facility.  LCSW will begin workup and follow up with bed offers. FL2 completed, MD please sign [place] existing as patient has been to SNF in past  (last year 2016) Faxed out and will follow up with bed offers.  Employment status:  Retired Forensic scientist:  Medicare PT Recommendations:  [place] / Referral to community resources:  [place]  Patient/Family's Response to care:  Agreeable to plan  Patient/Family's Understanding of and Emotional Response to Diagnosis, Current Treatment, and Prognosis:  Daughter reports concern for understanding patient's current symptoms and behaviors. Reports she is in agreement with treatment plan and needs.  Emotional Assessment Appearance:  Appears stated age Attitude/Demeanor/Rapport:  Apprehensive, Inconsistent, Other (slightly confused, but more oriented than on admission.  Pleasant and cooperative) Affect (typically observed):  Accepting, Adaptable, Hopeful Orientation:  Oriented to Self, Oriented to Place Alcohol / Substance use:  Not Applicable Psych involvement (Current and /or in the community):  No (Comment)  Discharge Needs  Concerns to be addressed:  No discharge needs identified Readmission within the last 30 days:  No Current discharge risk:  None Barriers to Discharge:  No Barriers Identified, Continued Medical Work up   [place], LCSW 05/28/2015, 10:30 AM

## 2015-05-28 NOTE — Progress Notes (Signed)
Anticoagulation monitoring(Lovenox):  82yo  ordered Lovenox 30 mg Q24h  Filed Weights   05/27/15 1020 05/27/15 1547  Weight: 145 lb (65.772 kg) 139 lb 3.2 oz (63.141 kg)   Body mass index is 27.19 kg/(m^2).  Lab Results  Component Value Date   CREATININE 0.94 05/28/2015   CREATININE 1.20* 05/27/2015   CREATININE 1.16* 07/23/2014   Estimated Creatinine Clearance: 36.9 mL/min (by C-G formula based on Cr of 0.94). Hemoglobin & Hematocrit     Component Value Date/Time   HGB 12.6 05/28/2015 0428   HGB 12.2 07/24/2013 0840   HCT 38.1 05/28/2015 0428   HCT 38.5 07/24/2013 0840     Per Protocol for Patient with estCrcl > 30 ml/min and BMI < 40, will transition to Lovenox 40 mg Q24h.

## 2015-05-28 NOTE — Clinical Social Work Placement (Signed)
   CLINICAL SOCIAL WORK PLACEMENT  NOTE  Date:  05/28/2015  Patient Details  Name: Martha Hunter MRN: [id number] Date of Birth: Feb 18, 1928  Clinical Social Work is seeking post-discharge placement for this patient at the Skilled  Nursing Facility level of care (*CSW will initial, date and re-position this form in  chart as items are completed):  Yes   Patient/family provided with [place] [facility] Clinical Social Work Department's list of facilities offering this level of care within the geographic area requested by the patient (or if unable, by the patient's family).  Yes   Patient/family informed of their freedom to choose among providers that offer the needed level of care, that participate in Medicare, Medicaid or managed care program needed by the patient, have an available bed and are willing to accept the patient.  Yes   Patient/family informed of [facility]'s ownership interest [facility] and [facility], as well as of the fact that they are under no obligation to receive care at these facilities.  PASRR submitted to EDS on       PASRR number received on       Existing PASRR number confirmed on 05/28/15     FL2 transmitted to all facilities in geographic area requested by pt/family on 05/28/15     FL2 transmitted to all facilities within larger geographic area on       Patient informed that his/her managed care company has contracts with or will negotiate with certain facilities, including the following:            Patient/family informed of bed offers received.  Patient chooses bed at       Physician recommends and patient chooses bed at      Patient to be transferred to   on  .  Patient to be transferred to facility by       Patient family notified on   of transfer.  Name of family member notified:        PHYSICIAN Please sign FL2     Additional Comment:    _______________________________________________ Martha Hunter, Martha Hancher N, LCSW 05/28/2015, 10:33 AM

## 2015-05-28 NOTE — Progress Notes (Signed)
Neurology:  Case d/w son over the phone.   Pt lives in assisted living facility. Mentation has been declining since Monday.  Pt has been having generalized weakness as well as tremor which appears to be essential in nature ( worse with holding objects)  - Pt's son does state that she is more awake today, but she is leaning towards the left side.  - CTH reviewed as well as labs. Some AKI on admission which is improving. No signs of infection  - Will obtain MRI brain and MRA head w/out contrast to look at R cerebellum and the posterior circulation specifically the PICA/basillar artery.    Full consultation note to follow.  Pauletta BrownsZEYLIKMAN, Jack Bolio

## 2015-05-28 NOTE — [place] FL2 (Signed)
[place] MEDICAID FL2 LEVEL OF CARE SCREENING TOOL     IDENTIFICATION  Patient Name: Martha DialsRuth Manni Birthdate: Oct 21, 1928 Sex: female Admission Date (Current Location): 05/27/2015  [place]   Facility and Address:  [facility], [address], [place], [place] [address]      Provider Number: [id number]  Attending Physician Name and Address:  Wyatt Hasteavid K Hower, MD  Relative Name and Phone Number:       Current Level of Care: Hospital Recommended Level of Care: Skilled Nursing Facility Prior Approval Number:    Date Approved/Denied:   PASRR Number:   [id number] SS:  [id number]   Discharge Plan: SNF    Current Diagnoses: Patient Active Problem List   Diagnosis Date Noted  . Altered mental status 05/27/2015  . Anxiety 10/14/2014  . Clinical depression 10/14/2014  . Dizziness 10/14/2014  . HLD (hyperlipidemia) 10/14/2014  . BP (high blood pressure) 10/14/2014  . Adiposity 10/14/2014  . Unstable bladder 10/14/2014  . Cystocele 10/14/2014  . Vaginal atrophy 10/14/2014  . CAP (community acquired pneumonia) 07/23/2014  . Chronic kidney disease (CKD), stage III (moderate) 04/07/2014    Orientation RESPIRATION BLADDER Height & Weight     Self, Place  Normal Incontinent Weight: 139 lb 3.2 oz (63.141 kg) Height:  5' (152.4 cm)  BEHAVIORAL SYMPTOMS/MOOD NEUROLOGICAL BOWEL NUTRITION STATUS  Other (Comment) (none)   Incontinent, Continent Diet (regular)  AMBULATORY STATUS COMMUNICATION OF NEEDS Skin   Extensive Assist Verbally Normal                       Personal Care Assistance Level of Assistance  Bathing, Feeding, Dressing Bathing Assistance: Maximum assistance Feeding assistance: Maximum assistance Dressing Assistance: Maximum assistance     Functional Limitations Info  Sight, Hearing, Speech Sight Info: Adequate Hearing Info: Impaired Speech Info: Adequate    SPECIAL CARE FACTORS FREQUENCY  PT (By  licensed PT), OT (By licensed OT)     PT Frequency: 5 OT Frequency: 5            Contractures Contractures Info: Not present    Additional Factors Info  Code Status, Allergies, Psychotropic, Insulin Sliding Scale, Isolation Precautions Code Status Info: Full Code Allergies Info: Niacin And Related, Nitrates, Organic, Penicillins, Sulfa Antibiotics Psychotropic Info: Zoloft Insulin Sliding Scale Info: none Isolation Precautions Info: none     Current Medications (05/28/2015):  This is the current hospital active medication list Current Facility-Administered Medications  Medication Dose Route Frequency Provider Last Rate Last Dose  . 0.9 %  sodium chloride infusion   Intravenous Continuous Houston SirenVivek J Sainani, MD 75 mL/hr at 05/27/15 1530    . acetaminophen (TYLENOL) tablet 650 mg  650 mg Oral Q6H PRN Houston SirenVivek J Sainani, MD       Or  . acetaminophen (TYLENOL) suppository 650 mg  650 mg Rectal Q6H PRN Houston SirenVivek J Sainani, MD      . acidophilus (RISAQUAD) capsule 1 capsule  1 capsule Oral q morning - 10a Houston SirenVivek J Sainani, MD   1 capsule at 05/28/15 0950  . alum & mag hydroxide-simeth (MAALOX/MYLANTA) 200-200-20 MG/5ML suspension 30 mL  30 mL Oral PRN Houston SirenVivek J Sainani, MD      . amLODipine (NORVASC) tablet 5 mg  5 mg Oral QHS Houston SirenVivek J Sainani, MD   5 mg at 05/28/15 0105  . calcium carbonate (TUMS - dosed in mg elemental calcium) chewable tablet 200 mg of elemental calcium  1 tablet  Oral Q4H PRN Houston Siren, MD      . cholecalciferol (VITAMIN D) tablet 1,000 Units  1,000 Units Oral Daily Cindi Carbon, [facility]   1,000 Units at 05/28/15 0951  . clopidogrel (PLAVIX) tablet 75 mg  75 mg Oral BH-q7a Houston Siren, MD   75 mg at 05/28/15 0755  . diazepam (VALIUM) tablet 5 mg  5 mg Oral QHS Houston Siren, MD   5 mg at 05/28/15 0106  . enoxaparin (LOVENOX) injection 30 mg  30 mg Subcutaneous Q24H Houston Siren, MD   30 mg at 05/28/15 0105  . ferrous sulfate tablet 325 mg  325 mg Oral Q1200 Houston Siren, MD      . hydrALAZINE (APRESOLINE) tablet 50 mg  50 mg Oral BID Houston Siren, MD   50 mg at 05/28/15 0950  . loperamide (IMODIUM) capsule 2 mg  2 mg Oral TID PRN Houston Siren, MD      . losartan (COZAAR) tablet 100 mg  100 mg Oral BH-q7a Houston Siren, MD   100 mg at 05/28/15 0755  . ondansetron (ZOFRAN) tablet 4 mg  4 mg Oral Q6H PRN Houston Siren, MD       Or  . ondansetron (ZOFRAN) injection 4 mg  4 mg Intravenous Q6H PRN Houston Siren, MD   4 mg at 05/28/15 0825  . pantoprazole (PROTONIX) EC tablet 40 mg  40 mg Oral Daily Houston Siren, MD   40 mg at 05/28/15 0950  . pravastatin (PRAVACHOL) tablet 20 mg  20 mg Oral QPM Houston Siren, MD   20 mg at 05/27/15 1717  . sertraline (ZOLOFT) tablet 100 mg  100 mg Oral BH-q7a Houston Siren, MD   100 mg at 05/28/15 1610     Discharge Medications: Please see discharge summary for a list of discharge medications.  Relevant Imaging Results:  Relevant Lab Results:   Additional Information  Patient admitted from ALF: [facility], will return after [facility] SN[facility].     Raye Sorrow, LCSW

## 2015-05-29 ENCOUNTER — Observation Stay: Payer: Medicare Other

## 2015-05-29 DIAGNOSIS — R531 Weakness: Secondary | ICD-10-CM | POA: Diagnosis present

## 2015-05-29 DIAGNOSIS — I129 Hypertensive chronic kidney disease with stage 1 through stage 4 chronic kidney disease, or unspecified chronic kidney disease: Secondary | ICD-10-CM | POA: Diagnosis not present

## 2015-05-29 DIAGNOSIS — Z8249 Family history of ischemic heart disease and other diseases of the circulatory system: Secondary | ICD-10-CM | POA: Diagnosis not present

## 2015-05-29 DIAGNOSIS — Z880 Allergy status to penicillin: Secondary | ICD-10-CM | POA: Diagnosis not present

## 2015-05-29 DIAGNOSIS — I6782 Cerebral ischemia: Secondary | ICD-10-CM | POA: Diagnosis not present

## 2015-05-29 DIAGNOSIS — G9341 Metabolic encephalopathy: Secondary | ICD-10-CM | POA: Diagnosis present

## 2015-05-29 DIAGNOSIS — R410 Disorientation, unspecified: Secondary | ICD-10-CM | POA: Diagnosis not present

## 2015-05-29 DIAGNOSIS — F329 Major depressive disorder, single episode, unspecified: Secondary | ICD-10-CM | POA: Diagnosis not present

## 2015-05-29 DIAGNOSIS — Z91018 Allergy to other foods: Secondary | ICD-10-CM | POA: Diagnosis not present

## 2015-05-29 DIAGNOSIS — Z881 Allergy status to other antibiotic agents status: Secondary | ICD-10-CM | POA: Diagnosis not present

## 2015-05-29 DIAGNOSIS — N179 Acute kidney failure, unspecified: Secondary | ICD-10-CM | POA: Diagnosis not present

## 2015-05-29 DIAGNOSIS — N183 Chronic kidney disease, stage 3 (moderate): Secondary | ICD-10-CM | POA: Diagnosis not present

## 2015-05-29 DIAGNOSIS — Z7902 Long term (current) use of antithrombotics/antiplatelets: Secondary | ICD-10-CM | POA: Diagnosis not present

## 2015-05-29 DIAGNOSIS — K219 Gastro-esophageal reflux disease without esophagitis: Secondary | ICD-10-CM | POA: Diagnosis not present

## 2015-05-29 DIAGNOSIS — F419 Anxiety disorder, unspecified: Secondary | ICD-10-CM | POA: Diagnosis not present

## 2015-05-29 DIAGNOSIS — E785 Hyperlipidemia, unspecified: Secondary | ICD-10-CM | POA: Diagnosis not present

## 2015-05-29 DIAGNOSIS — Z8673 Personal history of transient ischemic attack (TIA), and cerebral infarction without residual deficits: Secondary | ICD-10-CM | POA: Diagnosis not present

## 2015-05-29 DIAGNOSIS — Z91048 Other nonmedicinal substance allergy status: Secondary | ICD-10-CM | POA: Diagnosis not present

## 2015-05-29 DIAGNOSIS — E7800 Pure hypercholesterolemia, unspecified: Secondary | ICD-10-CM | POA: Diagnosis not present

## 2015-05-29 DIAGNOSIS — Z79899 Other long term (current) drug therapy: Secondary | ICD-10-CM | POA: Diagnosis not present

## 2015-05-29 LAB — BASIC METABOLIC PANEL
Anion gap: 8 (ref 5–15)
BUN: 19 mg/dL (ref 6–20)
CALCIUM: 8.9 mg/dL (ref 8.9–10.3)
CO2: 27 mmol/L (ref 22–32)
CREATININE: 0.97 mg/dL (ref 0.44–1.00)
Chloride: 100 mmol/L — ABNORMAL LOW (ref 101–111)
GFR calc Af Amer: >60 mL/min (ref >60)
GFR, EST NON AFRICAN AMERICAN: 52 mL/min — AB (ref >60)
Glucose, Bld: 97 mg/dL (ref 65–99)
Potassium: 3.6 mmol/L (ref 3.5–5.1)
Sodium: 135 mmol/L (ref 135–145)

## 2015-05-29 LAB — URINE CULTURE: CULTURE: NO GROWTH

## 2015-05-29 LAB — MRSA PCR SCREENING: MRSA by PCR: POSITIVE — AB

## 2015-05-29 MED ORDER — CHLORHEXIDINE GLUCONATE CLOTH 2 % EX PADS
6.0000 | MEDICATED_PAD | Freq: Every day | CUTANEOUS | Status: DC
  Administered 2015-05-30: 6 via TOPICAL

## 2015-05-29 MED ORDER — MUPIROCIN 2 % EX OINT
1.0000 "application " | TOPICAL_OINTMENT | Freq: Two times a day (BID) | CUTANEOUS | Status: DC
  Administered 2015-05-29 – 2015-05-30 (×2): 1 via NASAL
  Filled 2015-05-29: qty 22

## 2015-05-29 MED ORDER — STROKE: EARLY STAGES OF RECOVERY BOOK: Freq: Once | Status: DC

## 2015-05-29 NOTE — Consult Note (Signed)
CC: confusion and generalized weakness.   HPI: Martha Hunter is an 82 y.o. female Pt lives in assisted living facility. Mentation has been declining since Monday. Pt has been having generalized weakness as well as tremor which appears to be essential in nature ( worse with holding objects)  Mentation improved overnight but still generalized weakness  Past Medical History  Diagnosis Date  . Hypertension   . Hypercholesteremia   . Depression   . Staph infection   . CAP (community acquired pneumonia)     a. 07/2014.  Marland Kitchen. History of TIA (transient ischemic attack)     a. on plavix chronically.  . Cystocele   . Rectocele   . Bladder spasm   . Hyperlipidemia   . Vaginal atrophy   . Bladder instability   . UTI (lower urinary tract infection)     Past Surgical History  Procedure Laterality Date  . None    . Dilation and curettage of uterus      Family History  Problem Relation Age of Onset  . CAD      parents and 9 siblings all died with CAD.  Marland Kitchen. Heart disease Sister   . Heart disease Brother   . Cancer Neg Hx   . Diabetes Neg Hx     Social History:  reports that she has quit smoking. She does not have any smokeless tobacco history on file. She reports that she does not drink alcohol or use illicit drugs.  Allergies  Allergen Reactions  . Niacin And Related   . Nitrates, Organic Other (See Comments)    Reaction: unknown  . Penicillins Other (See Comments)    Reaction: unknown  . Sulfa Antibiotics Other (See Comments)    Reaction: unknown     Medications: I have reviewed the patient's current medications.  ROS: History obtained from the patient  General ROS: negative for - chills, fatigue, fever, night sweats, weight gain or weight loss Psychological ROS: negative for - behavioral disorder, hallucinations, memory difficulties, mood swings or suicidal ideation Ophthalmic ROS: negative for - blurry vision, double vision, eye pain or loss of vision ENT ROS: negative  for - epistaxis, nasal discharge, oral lesions, sore throat, tinnitus or vertigo Allergy and Immunology ROS: negative for - hives or itchy/watery eyes Hematological and Lymphatic ROS: negative for - bleeding problems, bruising or swollen lymph nodes Endocrine ROS: negative for - galactorrhea, hair pattern changes, polydipsia/polyuria or temperature intolerance Respiratory ROS: negative for - cough, hemoptysis, shortness of breath or wheezing Cardiovascular ROS: negative for - chest pain, dyspnea on exertion, edema or irregular heartbeat Gastrointestinal ROS: negative for - abdominal pain, diarrhea, hematemesis, nausea/vomiting or stool incontinence Genito-Urinary ROS: negative for - dysuria, hematuria, incontinence or urinary frequency/urgency Musculoskeletal ROS: negative for - joint swelling or muscular weakness Neurological ROS: as noted in HPI Dermatological ROS: negative for rash and skin lesion changes  Physical Examination: Blood pressure 128/88, pulse 70, temperature 98.1 F (36.7 C), temperature source Oral, resp. rate 17, height 5' (1.524 m), weight 139 lb 3.2 oz (63.141 kg), SpO2 96 %.   Neurological Examination Mental Status: Alert, oriented, thought content appropriate.  States name, date, facility.  Cranial Nerves: II: Discs flat bilaterally; Visual fields grossly normal, pupils equal, round, reactive to light and accommodation III,IV, VI: ptosis not present, extra-ocular motions intact bilaterally V,VII: smile symmetric, facial light touch sensation normal bilaterally VIII: decreased hearing bilaterally  IX,X: gag reflex present XI: bilateral shoulder shrug XII: midline tongue extension Motor: Right :  Upper extremity   4/5    Left:     Upper extremity   4/5  Lower extremity   3/5     Lower extremity   3/5 Tone and bulk:normal tone throughout; no atrophy noted Sensory: Pinprick and light touch intact throughout, bilaterally Deep Tendon Reflexes: 1+ and symmetric  throughout Plantars: Right: downgoing   Left: downgoing Cerebellar: normal finger-to-nose but slowed  Gait: not tested       Laboratory Studies:   Basic Metabolic Panel:  Recent Labs Lab 05/27/15 1036 05/28/15 0428 05/29/15 0420  NA 136 136 135  K 4.0 3.2* 3.6  CL 100* 102 100*  CO2 GLUCOSE 109* 100* 97  BUN 21* 19 19  CREATININE 1.20* 0.94 0.97  CALCIUM 9.6 9.0 8.9    Liver Function Tests:  Recent Labs Lab 05/27/15 1036  AST 28  ALT 11*  ALKPHOS 75  BILITOT 0.8  PROT 7.3  ALBUMIN 4.1   No results for input(s): LIPASE, AMYLASE in the last 168 hours. No results for input(s): AMMONIA in the last 168 hours.  CBC:  Recent Labs Lab 05/27/15 1036 05/28/15 0428  WBC 9.2 7.2  NEUTROABS 7.8*  --   HGB 13.1 12.6  HCT 39.5 38.1  MCV 81.5 82.8  PLT 191 151    Cardiac Enzymes:  Recent Labs Lab 05/27/15 1036  TROPONINI 0.03    BNP: Invalid input(s): POCBNP  CBG: No results for input(s): GLUCAP in the last 168 hours.  Microbiology: Results for orders placed or performed during the hospital encounter of 05/27/15  Urine culture     Status: None   Collection Time: 05/27/15 11:28 AM  Result Value Ref Range Status   Specimen Description URINE, RANDOM  Final   Special Requests NONE  Final   Culture NO GROWTH 2 DAYS  Final   Report Status 05/29/2015 FINAL  Final    Coagulation Studies: No results for input(s): LABPROT, INR in the last 72 hours.  Urinalysis:  Recent Labs Lab 05/27/15 1128  COLORURINE YELLOW*  LABSPEC 1.016  PHURINE 6.0  GLUCOSEU 50*  HGBUR 2+*  BILIRUBINUR NEGATIVE  KETONESUR NEGATIVE  PROTEINUR >500*  NITRITE NEGATIVE  LEUKOCYTESUR NEGATIVE    Lipid Panel:     Component Value Date/Time   CHOL 174 03/14/2013 0623   TRIG 126 03/14/2013 0623   HDL 37* 03/14/2013 0623   VLDL 25 03/14/2013 0623   LDLCALC 112* 03/14/2013 0623    HgbA1C: No results found for: HGBA1C  Urine Drug Screen:  No results found  for: LABOPIA, COCAINSCRNUR, LABBENZ, AMPHETMU, THCU, LABBARB  Alcohol Level: No results for input(s): ETH in the last 168 hours.  Other results: EKG: normal EKG, normal sinus rhythm, unchanged from previous tracings.  Imaging: Dg Chest 2 View  05/27/2015  CLINICAL DATA:  82 year old female with weakness and vomiting today. Initial encounter. EXAM: CHEST  2 VIEW COMPARISON:  07/20/2014 and earlier. FINDINGS: Stable cardiomegaly and mediastinal contours. No pneumothorax, pulmonary edema, pleural effusion or confluent pulmonary opacity. Stable mild increased interstitial markings. No pneumoperitoneum. Paucity of bowel gas in the upper abdomen. Osteopenia. No acute osseous abnormality identified. Calcified aortic atherosclerosis. IMPRESSION: No acute cardiopulmonary abnormality. Electronically Signed   By: Odessa Fleming M.D.   On: 05/27/2015 13:22   Ct Head Wo Contrast  05/27/2015  CLINICAL DATA:  Confusion. Recent treatment for urinary tract infection. Vomiting and weakness. EXAM: CT HEAD WITHOUT CONTRAST TECHNIQUE: Contiguous axial images were obtained from the base of  the skull through the vertex without intravenous contrast. COMPARISON:  CT 01/09/2013. FINDINGS: Brain: There is no evidence of acute intracranial hemorrhage, mass lesion, brain edema or extra-axial fluid collection. The ventricles and subarachnoid spaces are mildly prominent but stable. There is no CT evidence of acute cortical infarction. Chronic small vessel ischemic changes in the periventricular white matter and basal ganglia are unchanged. Prominent intracranial vascular calcifications are present. Bones/sinuses/visualized face: The visualized paranasal sinuses, mastoid air cells and middle ears are clear. The calvarium is intact. IMPRESSION: Stable head CT demonstrating no acute intracranial findings. Stable atrophy and chronic small vessel ischemic changes. Electronically Signed   By: Carey Bullocks M.D.   On: 05/27/2015 11:33      Assessment/Plan:  82 y.o. female Pt lives in assisted living facility. Mentation has been declining since Monday. Pt has been having generalized weakness as well as tremor which appears to be essential in nature ( worse with holding objects)  Mentation improved overnight but still generalized weakness  AKI improved. No clear evidence for this weakness. Pt is leaning to the R side, but no clear evidence of dysmetria.   - MRI/MRA ordered of head. D/w MRI will be done this AM. Worried about cerebellar ischemia on R due to leaning towards right side. But it does not usually produce such severe weakness in legs.  - PT/OT - s/p discussion with family and pt at bedside.  Pauletta Browns  05/29/2015, 9:31 AM

## 2015-05-29 NOTE — Progress Notes (Signed)
Dr. Loretha BrasilZeylikman updated on MRI results, new orders received.

## 2015-05-29 NOTE — Care Management (Signed)
Case discussed with attending and he has changed her Inpatient admission. TC to so to update him. He was appreciative.

## 2015-05-29 NOTE — Progress Notes (Signed)
Physical Therapy Treatment Patient Details Name: Martha Hunter MRN: [id number] DOB: 1928/06/05 Today's Date: 05/29/2015    History of Present Illness Pt admitted for complaints of AMS. Pt with history of HTN, hyperlipdemia, and UTI. Pt currently lives at ALF.    PT Comments    Pt is making slow progress towards goals and demonstrates slightly improved balance this date. Pt still with R side leaning, however continues with no sensation or coordination deficits with finger->nose testing. Pt more alert this date and able to participate in therapy. Pt continues to exhibit weakness in B LE and requires +2 for all mobility. Rollater not safe to use for mobility at this time, used RW for limited ambulation. Pt assisted for positioning in bed to prevent R side leaning. Continue to recommend SNF, however due to insurance, pt will return back to ALF for therapy.  Follow Up Recommendations  SNF     Equipment Recommendations       Recommendations for Other Services       Precautions / Restrictions Precautions Precautions: Fall Restrictions Weight Bearing Restrictions: No    Mobility  Bed Mobility Overal bed mobility: Needs Assistance Bed Mobility: Supine to Sit     Supine to sit: Mod assist     General bed mobility comments: assist for transfer to EOB. + 2 assist for trunk stabilization and sliding legs off bed. Once seated at EOB, pt with moderate R side leaning, slighly improved from previous date, however is unable to self correct without cues.  Transfers Overall transfer level: Needs assistance Equipment used: Rolling walker (2 wheeled) Transfers: Sit to/from Stand Sit to Stand: Mod assist;+2 physical assistance         General transfer comment: Pt needs heavy assist for transfer. Once standing, pt demonstrates post leaning, unable to self correct. Slight R side leaning noted during transfer. RW used for assistance as opposed to [employer]  Ambulation/Gait Ambulation/Gait  assistance: Mod assist;+2 physical assistance Ambulation Distance (Feet): 2 Feet Assistive device: Rolling walker (2 wheeled) Gait Pattern/deviations: Step-to pattern     General Gait Details: ambulated forward/backward at bedside, however pt with heavy post leaning and has difficulty with weight shifting in order to take steps. Pt with slow stepping, although is able to follow cues.   Stairs            Wheelchair Mobility    Modified Rankin (Stroke Patients Only)       Balance Overall balance assessment: Needs assistance   Sitting balance-Leahy Scale: Poor (Extensive leaning to the right initially. Limited initiation to right self. in sitting.)       Standing balance-Leahy Scale: Poor                      Cognition Arousal/Alertness: Awake/alert Behavior During Therapy: Flat affect Overall Cognitive Status: Within Functional Limits for tasks assessed                      Exercises Other Exercises Other Exercises: supine ther-ex performed on B LE including SAQ, SLR, and hip add squeezes. All ther-ex performed x 12 reps with min/mod assist for correct technique Other Exercises: Spent extensive time educating family on pt mobility post dc from hospital including use of WC to prevent falls    General Comments        Pertinent Vitals/Pain Pain Assessment: No/denies pain    Home Living Family/patient expects to be discharged to:: Assisted living  Home Equipment: Walker - 4 wheels      Prior Function Level of Independence: Independent with assistive device(s)      Comments: Assist with Morning ADLs   PT Goals (current goals can now be found in the care plan section) Acute Rehab PT Goals Patient Stated Goal: to get stronger PT Goal Formulation: With patient Time For Goal Achievement: 06/11/15 Potential to Achieve Goals: Good Progress towards PT goals: Progressing toward goals    Frequency  Min 2X/week    PT Plan  Current plan remains appropriate    Co-evaluation             End of Session Equipment Utilized During Treatment: Gait belt Activity Tolerance: Patient limited by fatigue Patient left: in bed;with bed alarm set;with family/visitor present     Time: 1440-1510 PT Time Calculation (min) (ACUTE ONLY): 30 min  Charges:  $Gait Training: 8-22 mins $Therapeutic Exercise: 8-22 mins                    G Codes:     Martha PalauStephanie Kainat Hunter, PT, DPT [phone]  Martha Hunter 05/29/2015, 4:13 PM

## 2015-05-29 NOTE — Progress Notes (Signed)
Back from MRI.

## 2015-05-29 NOTE — Evaluation (Signed)
Occupational Therapy Evaluation Patient Details Name: Martha Hunter MRN: [id number] DOB: [birth year] Today's Date: 05/29/2015    History of Present Illness Pt admitted for complaints of AMS. Pt with history of HTN, hyperlipdemia, and UTI. Pt currently lives at ALF.   Clinical Impression   Pt. Is an 82 y.o. female who was admitted to [facility] with AMS. Pt. presents with weakness, limited activity tolerance, poor sitting balance, and cognitive changes which hinder her ability to complete ADL and IADL functioning.  Pt. has difficulty performing self-feeding skills. Pt. could benefit from continued skilled OT services to improve ADL and IADL functioning in order to return to her PLOF at her previous living environment.    Follow Up Recommendations  SNF    Equipment Recommendations       Recommendations for Other Services PT consult     Precautions / Restrictions Precautions Precautions: Fall Restrictions Weight Bearing Restrictions: No      Mobility Bed Mobility Overal bed mobility: Needs Assistance Bed Mobility: Supine to Sit     Supine to sit: Mod assist      Transfers Overall transfer level: Needs assistance Equipment used: Rolling walker (2 wheeled) Transfers: Sit to/from Stand Sit to Stand: Mod assist;+2 physical assistance           Balance Overall balance assessment: Needs assistance: Pt. Presents with heavy leaning to the right in sitting and standing.    Sitting balance-Leahy Scale: Poor (Extensive leaning to the right initially. Limited initiation to right self in sitting or standing)       Standing balance-Leahy Scale: Poor                              ADL Overall ADL's : Needs assistance/impaired Eating/Feeding: Minimal assistance;Set up (Pt. has tremors)   Grooming: Set up;Min guard   Upper Body dressing: gown management  Mod A           Lower Body Dressing: Maximal assistance                       Vision      Perception     Praxis      Pertinent Vitals/Pain Pain Assessment: No/denies pain     Hand Dominance     Extremity/Trunk Assessment Upper Extremity Assessment Upper Extremity Assessment: RUE deficits/detail RUE Deficits / Details: Impaired right UE strength 3/5 overall LUE Deficits / Details: Impaired LUE strength 3+/5 overall   Lower Extremity Assessment Lower Extremity Assessment: Defer to PT evaluation       Communication Communication Communication: No difficulties   Cognition Arousal/Alertness: Awake/alert Behavior During Therapy: Flat affect Overall Cognitive Status: Within Functional Limits for tasks assessed                     General Comments       Exercises     Shoulder Instructions      Home Living Family/patient expects to be discharged to:: Assisted living                             Home Equipment: Walker - 4 wheels          Prior Functioning/Environment Level of Independence: Independent with assistive device(s)        Comments: Assist with Morning ADLs    OT Diagnosis: Generalized weakness   OT Problem List: Decreased strength;Decreased range of  motion;Decreased activity tolerance;Impaired balance (sitting and/or standing)   OT Treatment/Interventions: Self-care/ADL training;Therapeutic exercise;DME and/or AE instruction;Patient/family education;Balance training;Therapeutic activities    OT Goals(Current goals can be found in the care plan section) Acute Rehab OT Goals Patient Stated Goal: to get stronger  OT Frequency: Min 1X/week   Barriers to D/C:            Co-evaluation              End of Session Equipment Utilized During Treatment: Gait belt Nurse Communication: Other (comment)  Activity Tolerance: Patient tolerated treatment well (Limited by cognition.) Patient left: in bed   Time: 1435-1500 OT Time Calculation (min): 25 min Charges:  OT General Charges $OT Visit: 1 Procedure OT  Evaluation $OT Eval High Complexity: 1 Procedure G-Codes:    Olegario MessierElaine Jeanna Giuffre, MS, OTR/L Olegario MessierElaine Semone Orlov 05/29/2015, 4:15 PM

## 2015-05-29 NOTE — Progress Notes (Addendum)
LCSW is aware of CSW consult and patient was worked up for SNF due to generalized weakness.  Patient is currently in Medicare Observation Status and does not have a 3 day qualifying stay. Patient also does not fall into "Next Gen" program that waves the 3 day qualifying stay.   Patient most likely will have to return to facility with PT/OT unless family can pay privately. LCSW will follow up with family with regards to disposition.   FL2 updated to reflect DC back to ALF  Deretha EmoryHannah Cristian Grieves LCSW, MSW Clinical Social Work: [employer] [phone]

## 2015-05-29 NOTE — Progress Notes (Signed)
To MRI via bed

## 2015-05-29 NOTE — Care Management (Signed)
Met with son and daughter at bedside. Discussed in detail the Medicare guidelines for Inpatient verses Observation admission. Explained the 3 day medicare rule to qualify for SNF. Son continues to state that he does understand why Medicare doesn't consider weakness and decreased mobility a reason for Inpatient admission. Explained to him that PT and OT would be evaluating patient prior to discharge. As we are speaking patient is sitting up in bed, eating ,feeding herself and is alert. Requested PT reevaluate patients mobility today.

## 2015-05-29 NOTE — [place] FL2 (Signed)
[place] MEDICAID FL2 LEVEL OF CARE SCREENING TOOL     IDENTIFICATION  Patient Name: Martha Hunter Birthdate: [birth year] Sex: female Admission Date (Current Location): 05/27/2015  [facility] and [place] Number:  Chiropodist and Address:  [facility], [address], [place], [place] [address]      Provider Number: [id number]  Attending Physician Name and Address:  Wyatt Haste, MD  Relative Name and Phone Number:       Current Level of Care: Hospital Recommended Level of Care: Assisted Living Facility Prior Approval Number:    Date Approved/Denied:   PASRR Number:    Discharge Plan: Other (Comment) (return to ALF)    Current Diagnoses: Patient Active Problem List   Diagnosis Date Noted  . Altered mental status 05/27/2015  . Anxiety 10/14/2014  . Clinical depression 10/14/2014  . Dizziness 10/14/2014  . HLD (hyperlipidemia) 10/14/2014  . BP (high blood pressure) 10/14/2014  . Adiposity 10/14/2014  . Unstable bladder 10/14/2014  . Cystocele 10/14/2014  . Vaginal atrophy 10/14/2014  . CAP (community acquired pneumonia) 07/23/2014  . Chronic kidney disease (CKD), stage III (moderate) 04/07/2014    Orientation RESPIRATION BLADDER Height & Weight     Self, Time, Situation, Place  Normal Continent Weight: 139 lb 3.2 oz (63.141 kg) Height:  5' (152.4 cm)  BEHAVIORAL SYMPTOMS/MOOD NEUROLOGICAL BOWEL NUTRITION STATUS  Other (Comment) (none)   Continent Diet (regular)  AMBULATORY STATUS COMMUNICATION OF NEEDS Skin   Limited Assist Verbally Normal                       Personal Care Assistance Level of Assistance  Bathing, Feeding, Dressing Bathing Assistance: Limited assistance Feeding assistance: Limited assistance Dressing Assistance: Limited assistance     Functional Limitations Info  Sight, Hearing, Speech Sight Info: Adequate Hearing Info: Adequate Speech Info: Adequate    SPECIAL CARE FACTORS FREQUENCY  PT (By  licensed PT), OT (By licensed OT)     PT Frequency: home health at facility OT Frequency: home health at facility            Contractures Contractures Info: Not present    Additional Factors Info  Code Status, Allergies, Psychotropic, Insulin Sliding Scale, Isolation Precautions Code Status Info: Full Code Allergies Info: Niacin And Related, Nitrates, Organic, Penicillins, Sulfa Antibiotics Psychotropic Info: zoloft Insulin Sliding Scale Info: none Isolation Precautions Info: none     Current Medications (05/29/2015):  This is the current hospital active medication list Current Facility-Administered Medications  Medication Dose Route Frequency Provider Last Rate Last Dose  . 0.9 %  sodium chloride infusion   Intravenous Continuous Houston Siren, MD 75 mL/hr at 05/29/15 0757    . acetaminophen (TYLENOL) tablet 650 mg  650 mg Oral Q6H PRN Houston Siren, MD       Or  . acetaminophen (TYLENOL) suppository 650 mg  650 mg Rectal Q6H PRN Houston Siren, MD      . acidophilus (RISAQUAD) capsule 1 capsule  1 capsule Oral q morning - 10a Houston Siren, MD   1 capsule at 05/28/15 0950  . alum & mag hydroxide-simeth (MAALOX/MYLANTA) 200-200-20 MG/5ML suspension 30 mL  30 mL Oral PRN Houston Siren, MD      . amLODipine (NORVASC) tablet 5 mg  5 mg Oral QHS Houston Siren, MD   5 mg at 05/28/15 2104  . calcium carbonate (TUMS - dosed in mg elemental calcium) chewable tablet 200 mg  of elemental calcium  1 tablet Oral Q4H PRN Houston SirenVivek J Sainani, MD      . cholecalciferol (VITAMIN D) tablet 1,000 Units  1,000 Units Oral Daily Cindi CarbonMary M Swayne, RPH   1,000 Units at 05/28/15 0951  . clopidogrel (PLAVIX) tablet 75 mg  75 mg Oral BH-q7a Houston SirenVivek J Sainani, MD   75 mg at 05/29/15 0800  . diazepam (VALIUM) tablet 5 mg  5 mg Oral QHS Houston SirenVivek J Sainani, MD   5 mg at 05/28/15 2104  . enoxaparin (LOVENOX) injection 40 mg  40 mg Subcutaneous Q24H Houston SirenVivek J Sainani, MD   40 mg at 05/28/15 2104  . ferrous sulfate  tablet 325 mg  325 mg Oral Q1200 Houston SirenVivek J Sainani, MD   325 mg at 05/28/15 1138  . hydrALAZINE (APRESOLINE) tablet 50 mg  50 mg Oral BID Houston SirenVivek J Sainani, MD   50 mg at 05/28/15 2104  . loperamide (IMODIUM) capsule 2 mg  2 mg Oral TID PRN Houston SirenVivek J Sainani, MD      . losartan (COZAAR) tablet 100 mg  100 mg Oral BH-q7a Houston SirenVivek J Sainani, MD   100 mg at 05/29/15 0800  . ondansetron (ZOFRAN) tablet 4 mg  4 mg Oral Q6H PRN Houston SirenVivek J Sainani, MD       Or  . ondansetron (ZOFRAN) injection 4 mg  4 mg Intravenous Q6H PRN Houston SirenVivek J Sainani, MD   4 mg at 05/28/15 0825  . pantoprazole (PROTONIX) EC tablet 40 mg  40 mg Oral Daily Houston SirenVivek J Sainani, MD   40 mg at 05/28/15 0950  . pravastatin (PRAVACHOL) tablet 20 mg  20 mg Oral QPM Houston SirenVivek J Sainani, MD   20 mg at 05/28/15 1705  . sertraline (ZOLOFT) tablet 100 mg  100 mg Oral BH-q7a Houston SirenVivek J Sainani, MD   100 mg at 05/29/15 0800     Discharge Medications: Please see discharge summary for a list of discharge medications.  Relevant Imaging Results:  Relevant Lab Results:   Additional Information home health at facility  PT/OT  Raye SorrowCoble, [place] Bentson N, [place]

## 2015-05-29 NOTE — Progress Notes (Signed)
Cardiac monitor placed back on pt and verifed.

## 2015-05-29 NOTE — Progress Notes (Signed)
LCSW following patient for placement, however patient remains in observation status and does not meet criteria for SNF. Family is aware as CM is working with family with home health at facility. LCSW has updated FL2 to reflect [facility].  [facility] over [facility]. No needs currently. LCSW will facilitate DC back to ALF once patient is medically stable.  Deretha EmoryHannah Renton Berkley LCSW, MSW Clinical Social Work: [facility] [employer] [phone]

## 2015-05-29 NOTE — Care Management (Signed)
Spoke with son to inform him that patient did not meet criteria for inpatient admission. MRI negative. He was understanding. He is agreeable to home health services. No agency preference. Referral to [facility] for PT/OT.

## 2015-05-29 NOTE — Progress Notes (Signed)
[facility] [place] [facility]   PATIENT NAME: Martha DialsRuth Hunter    MRN#:  [id number]  DATE OF BIRTH:  08-13-1928  SUBJECTIVE:  Hospital Day: 0 days Martha Hunter is a 82 y.o. female presenting with Altered Mental Status .   Overnight events: No overnight events Interval Events: Actually some improvement in mental status which appears to be closer to baseline however still remains markedly weak, family members present at bedside weakness seems very more prominent on the right side  REVIEW OF SYSTEMS:  CONSTITUTIONAL: No fever, positive fatigue or weakness.  EYES: No blurred or double vision.  EARS, NOSE, AND THROAT: No tinnitus or ear pain.  RESPIRATORY: No cough, shortness of breath, wheezing or hemoptysis.  CARDIOVASCULAR: No chest pain, orthopnea, edema.  GASTROINTESTINAL: Positive nausea, vomiting, denies diarrhea or abdominal pain.  GENITOURINARY: No dysuria, hematuria.  ENDOCRINE: No polyuria, nocturia,  HEMATOLOGY: No anemia, easy bruising or bleeding SKIN: No rash or lesion. MUSCULOSKELETAL: No joint pain or arthritis.   NEUROLOGIC: No tingling, numbness, positive weakness.  PSYCHIATRY: No anxiety or depression.   DRUG ALLERGIES:   Allergies  Allergen Reactions  . Niacin And Related   . Nitrates, Organic Other (See Comments)    Reaction: unknown  . Penicillins Other (See Comments)    Reaction: unknown  . Sulfa Antibiotics Other (See Comments)    Reaction: unknown     VITALS:  Blood pressure 161/69, pulse 66, temperature 98.3 F (36.8 C), temperature source Oral, resp. rate 20, height 5' (1.524 m), weight 63.141 kg (139 lb 3.2 oz), SpO2 93 %.  PHYSICAL EXAMINATION:  VITAL SIGNS: Filed Vitals:   05/29/15 0753 05/29/15 1125  BP: 128/88 161/69  Pulse: 70 66  Temp: 98.1 F (36.7 C) 98.3 F (36.8 C)  Resp: 17 20   GENERAL:82 y.o.female currently in no acute distress.  HEAD: Normocephalic, atraumatic.  EYES: Pupils equal, round,  reactive to light. Extraocular muscles intact. No scleral icterus.  MOUTH: Moist mucosal membrane. Dentition intact. No abscess noted.  EAR, NOSE, THROAT: Clear without exudates. No external lesions.  NECK: Supple. No thyromegaly. No nodules. No JVD.  PULMONARY: Clear to ascultation, without wheeze rails or rhonci. No use of accessory muscles, Good respiratory effort. good air entry bilaterally CHEST: Nontender to palpation.  CARDIOVASCULAR: S1 and S2. Regular rate and rhythm. No murmurs, rubs, or gallops. No edema. Pedal pulses 2+ bilaterally.  GASTROINTESTINAL: Soft, nontender, nondistended. No masses. Positive bowel sounds. No hepatosplenomegaly.  MUSCULOSKELETAL: No swelling, clubbing, or edema. Range of motion full in all extremities.  NEUROLOGIC: Cranial nerves II through XII are intact. Diffuse weakness 4/5 all extremities including proximal and distal flexion and extension. Right greater than left Sensation intact. Reflexes intact.  SKIN: No ulceration, lesions, rashes, or cyanosis. Skin warm and dry. Turgor intact.  PSYCHIATRIC: Mood, affect within normal limits. The patient is awake, alert and oriented x 3. Insight, judgment intact.      LABORATORY PANEL:   CBC  Recent Labs Lab 05/28/15 0428  WBC 7.2  HGB 12.6  HCT 38.1  PLT 151   ------------------------------------------------------------------------------------------------------------------  Chemistries   Recent Labs Lab 05/27/15 1036  05/29/15 0420  NA 136  < > 135  K 4.0  < > 3.6  CL 100*  < > 100*  CO2 29  < > 27  GLUCOSE 109*  < > 97  BUN 21*  < > 19  CREATININE 1.20*  < > 0.97  CALCIUM 9.6  < > 8.9  AST 28  --   --   ALT 11*  --   --   ALKPHOS 75  --   --   BILITOT 0.8  --   --   < > = values in this interval not displayed. ------------------------------------------------------------------------------------------------------------------  Cardiac Enzymes  Recent Labs Lab 05/27/15 1036   TROPONINI 0.03   ------------------------------------------------------------------------------------------------------------------  RADIOLOGY:  Mr Martha Hunter Wo Contrast  05/29/2015  CLINICAL DATA:  82 year old female with weakness and altered mental status progressively for 2 days. Increased tremor. Initial encounter. EXAM: MRI HEAD WITHOUT CONTRAST MRA HEAD WITHOUT CONTRAST TECHNIQUE: Multiplanar, multiecho pulse sequences of the brain and surrounding structures were obtained without intravenous contrast. Angiographic images of the head were obtained using MRA technique without contrast. COMPARISON:  Noncontrast head CT 05/27/2015.  Brain MRI 12/06/2011. FINDINGS: MRI HEAD FINDINGS Major intracranial vascular flow voids are stable since 2013, the right ICA siphon appears mildly dolichoectasia attic. Chronic bilateral corona radiata lacunar infarcts tracking to the basal ganglia are stable since 2013. Patchy cerebral white matter and pontine T2 hyperintensity appears mildly progressed. Chronic lacunar infarcts in the cerebellum have mildly increased (series 13, image 7 on the right). Scattered chronic micro hemorrhages. No restricted diffusion to suggest acute infarction. No midline shift, mass effect, evidence of mass lesion, ventriculomegaly, extra-axial collection or acute intracranial hemorrhage. Cervicomedullary junction and pituitary are within normal limits. Negative visualized cervical spine. No supratentorial cortical encephalomalacia. Visualized internal auditory structures appear stable and within normal limits. Negative mastoid air cells. Trace paranasal sinus mucosal thickening. Stable orbit and scalp soft tissues. Bone marrow signal is stable and within normal limits. MRA HEAD FINDINGS Antegrade flow in the posterior circulation with no distal vertebral artery stenosis. Mildly dominant distal left vertebral artery. Normal PICA origins and vertebrobasilar junction. No basilar artery stenosis.  Normal SCA and PCA origins. Normal right posterior communicating artery, the left is diminutive or absent. Bilateral PCA branches are within normal limits. Antegrade flow in both ICA siphons. Mild fusiform enlargement of the right cavernous segment, up to 7 mm diameter. No siphon stenosis. Normal ophthalmic and right posterior communicating artery origins. Patent carotid termini. Normal MCA and ACA origins. Diminutive anterior communicating artery. Visualized bilateral ACA branches are within normal limits. MCA M1 segments and bifurcations are within normal limits. Visualized bilateral MCA branches are within normal limits. IMPRESSION: 1.  No acute intracranial abnormality. 2. Chronic small vessel ischemia, mild progression in the cerebellum since 2013. 3. Negative intracranial MRA aside from dolichoectasia of the right ICA siphon. Electronically Signed   By: Odessa Fleming M.D.   On: 05/29/2015 11:37   Mr Brain Wo Contrast  05/29/2015  CLINICAL DATA:  82 year old female with weakness and altered mental status progressively for 2 days. Increased tremor. Initial encounter. EXAM: MRI HEAD WITHOUT CONTRAST MRA HEAD WITHOUT CONTRAST TECHNIQUE: Multiplanar, multiecho pulse sequences of the brain and surrounding structures were obtained without intravenous contrast. Angiographic images of the head were obtained using MRA technique without contrast. COMPARISON:  Noncontrast head CT 05/27/2015.  Brain MRI 12/06/2011. FINDINGS: MRI HEAD FINDINGS Major intracranial vascular flow voids are stable since 2013, the right ICA siphon appears mildly dolichoectasia attic. Chronic bilateral corona radiata lacunar infarcts tracking to the basal ganglia are stable since 2013. Patchy cerebral white matter and pontine T2 hyperintensity appears mildly progressed. Chronic lacunar infarcts in the cerebellum have mildly increased (series 13, image 7 on the right). Scattered chronic micro hemorrhages. No restricted diffusion to suggest acute  infarction. No midline shift, mass  effect, evidence of mass lesion, ventriculomegaly, extra-axial collection or acute intracranial hemorrhage. Cervicomedullary junction and pituitary are within normal limits. Negative visualized cervical spine. No supratentorial cortical encephalomalacia. Visualized internal auditory structures appear stable and within normal limits. Negative mastoid air cells. Trace paranasal sinus mucosal thickening. Stable orbit and scalp soft tissues. Bone marrow signal is stable and within normal limits. MRA HEAD FINDINGS Antegrade flow in the posterior circulation with no distal vertebral artery stenosis. Mildly dominant distal left vertebral artery. Normal PICA origins and vertebrobasilar junction. No basilar artery stenosis. Normal SCA and PCA origins. Normal right posterior communicating artery, the left is diminutive or absent. Bilateral PCA branches are within normal limits. Antegrade flow in both ICA siphons. Mild fusiform enlargement of the right cavernous segment, up to 7 mm diameter. No siphon stenosis. Normal ophthalmic and right posterior communicating artery origins. Patent carotid termini. Normal MCA and ACA origins. Diminutive anterior communicating artery. Visualized bilateral ACA branches are within normal limits. MCA M1 segments and bifurcations are within normal limits. Visualized bilateral MCA branches are within normal limits. IMPRESSION: 1.  No acute intracranial abnormality. 2. Chronic small vessel ischemia, mild progression in the cerebellum since 2013. 3. Negative intracranial MRA aside from dolichoectasia of the right ICA siphon. Electronically Signed   By: Odessa Fleming M.D.   On: 05/29/2015 11:37    EKG:   Orders placed or performed during the hospital encounter of 05/27/15  . ED EKG  . ED EKG    ASSESSMENT AND PLAN:   Martha Hunter is a 82 y.o. female presenting with Altered Mental Status . Admitted 05/27/2015 : Day #: 0 days    1. metabolic encephalopathy  but source unclear. -We'll get neurology consult given her tremor, will also get physical therapy consult to assess mobility.  2. Generalized weakness-physical therapy eval to assess mobility.  3. Acute kidney injury-improved  4. Essential hypertension-continue Norvasc, losartan, hydralazine.  5. GERD-continue Protonix.  6. Hyperlipidemia-continue Pravachol.  7. Depression-continue Zoloft.  8. History of previous TIA-continue Plavix.   All the records are reviewed and case discussed with Care Management/Social Workerr. Management plans discussed with the patient, family and they are in agreement.  CODE STATUS: full TOTAL TIME TAKING CARE OF THIS PATIENT: 28 minutes.   POSSIBLE D/C IN 1-2DAYS, DEPENDING ON CLINICAL CONDITION.   Devika Dragovich,  Mardi Mainland.D on 05/29/2015 at 12:43 PM  Between 7am to 6pm - Pager - [phone]  After 6pm: House Pager: - [phone]  [facility]  Office  [phone]  CC: Primary care physician; No primary care provider on file.

## 2015-05-29 NOTE — Plan of Care (Signed)
Problem: Safety: Goal: Ability to remain free from injury will improve Outcome: Progressing Fall precautions in place  Problem: Physical Regulation: Goal: Ability to maintain clinical measurements within normal limits will improve Outcome: Progressing PT working with pt  Problem: Tissue Perfusion: Goal: Risk factors for ineffective tissue perfusion will decrease Outcome: Progressing SQ Lovenox

## 2015-05-29 NOTE — Progress Notes (Signed)
Dr. Clint GuyHower up dated on MRI results, orders received.

## 2015-05-29 NOTE — Care Management (Signed)
Son called and wants to uses PT at the facility. [facility]. Will send order. Spoke with Trey PaulaJeff, the facility's physical therapist to make him aware of son's request. Cancelled [facility] referral

## 2015-05-29 NOTE — Care Management (Signed)
TC to son Melissa NoonJohn Ochs. Attempted to discuss a [facility] plan [facility] test results. He became very angry and stated that she was not able to return to a ALF level of care. CM ask him if he was willing to pay for SNF privately and he replied, "No,  what would you do just put her on the street." CM explained in detail the difference in Inpatient and Observation admission as well as the 3 day Medicare rule. He was not willing to discuss home health or any additional discharge plan until after the MRI results came back. CM agreed.  He remained angry and ask who he needed to call regarding the Medicare rules. CM told son that I had left a Medicare Observation letter in the room yesterday with his sister and he could reference that further and call the Medicare number on the form. I offered any support I could provide in helping him to better understand the Medicare rules as well as any support I could provide at discharge to his mother with home health services. He remained angry and wanted to wait and re vist this conversation once the MRI results returned. Also offered to have the CSW call him and he declined, "If she is going to tell me what you have said then I don't want to talk to her." CM will follow progression and provide care as appropriate for discharge plan.

## 2015-05-30 DIAGNOSIS — R410 Disorientation, unspecified: Secondary | ICD-10-CM | POA: Diagnosis not present

## 2015-05-30 DIAGNOSIS — R531 Weakness: Secondary | ICD-10-CM | POA: Diagnosis not present

## 2015-05-30 DIAGNOSIS — G9341 Metabolic encephalopathy: Secondary | ICD-10-CM | POA: Diagnosis not present

## 2015-05-30 LAB — LIPID PANEL
CHOL/HDL RATIO: 6.2 ratio
Cholesterol: 262 mg/dL — ABNORMAL HIGH (ref 0–200)
HDL: 42 mg/dL (ref >40)
LDL CALC: 177 mg/dL — AB (ref 0–99)
TRIGLYCERIDES: 217 mg/dL — AB (ref <150)
VLDL: 43 mg/dL — AB (ref 0–40)

## 2015-05-30 NOTE — Consult Note (Signed)
CC: confusion and generalized weakness.   Mentation up and down.  Past Medical History  Diagnosis Date  . Hypertension   . Hypercholesteremia   . Depression   . Staph infection   . CAP (community acquired pneumonia)     a. 07/2014.  Marland Kitchen History of TIA (transient ischemic attack)     a. on plavix chronically.  . Cystocele   . Rectocele   . Bladder spasm   . Hyperlipidemia   . Vaginal atrophy   . Bladder instability   . UTI (lower urinary tract infection)     Past Surgical History  Procedure Laterality Date  . None    . Dilation and curettage of uterus      Family History  Problem Relation Age of Onset  . CAD      parents and 9 siblings all died with CAD.  Marland Kitchen Heart disease Sister   . Heart disease Brother   . Cancer Neg Hx   . Diabetes Neg Hx     Social History:  reports that she has quit smoking. She does not have any smokeless tobacco history on file. She reports that she does not drink alcohol or use illicit drugs.  Allergies  Allergen Reactions  . Niacin And Related   . Nitrates, Organic Other (See Comments)    Reaction: unknown  . Penicillins Other (See Comments)    Reaction: unknown  . Sulfa Antibiotics Other (See Comments)    Reaction: unknown     Medications: I have reviewed the patient's current medications.  ROS: History obtained from the patient  General ROS: negative for - chills, fatigue, fever, night sweats, weight gain or weight loss Psychological ROS: negative for - behavioral disorder, hallucinations, memory difficulties, mood swings or suicidal ideation Ophthalmic ROS: negative for - blurry vision, double vision, eye pain or loss of vision ENT ROS: negative for - epistaxis, nasal discharge, oral lesions, sore throat, tinnitus or vertigo Allergy and Immunology ROS: negative for - hives or itchy/watery eyes Hematological and Lymphatic ROS: negative for - bleeding problems, bruising or swollen lymph nodes Endocrine ROS: negative for -  galactorrhea, hair pattern changes, polydipsia/polyuria or temperature intolerance Respiratory ROS: negative for - cough, hemoptysis, shortness of breath or wheezing Cardiovascular ROS: negative for - chest pain, dyspnea on exertion, edema or irregular heartbeat Gastrointestinal ROS: negative for - abdominal pain, diarrhea, hematemesis, nausea/vomiting or stool incontinence Genito-Urinary ROS: negative for - dysuria, hematuria, incontinence or urinary frequency/urgency Musculoskeletal ROS: negative for - joint swelling or muscular weakness Neurological ROS: as noted in HPI Dermatological ROS: negative for rash and skin lesion changes  Physical Examination: Blood pressure 162/61, pulse 73, temperature 97.8 F (36.6 C), temperature source Oral, resp. rate 18, height 5' (1.524 m), weight 139 lb 3.2 oz (63.141 kg), SpO2 93 %.   Neurological Examination Mental Status: Alert, oriented, thought content appropriate.  States name, date, facility.  Cranial Nerves: II: Discs flat bilaterally; Visual fields grossly normal, pupils equal, round, reactive to light and accommodation III,IV, VI: ptosis not present, extra-ocular motions intact bilaterally V,VII: smile symmetric, facial light touch sensation normal bilaterally VIII: decreased hearing bilaterally  IX,X: gag reflex present XI: bilateral shoulder shrug XII: midline tongue extension Motor: Right : Upper extremity   4/5    Left:     Upper extremity   4/5  Lower extremity   3/5     Lower extremity   3/5 Tone and bulk:normal tone throughout; no atrophy noted Sensory: Pinprick and light touch  intact throughout, bilaterally Deep Tendon Reflexes: 1+ and symmetric throughout Plantars: Right: downgoing   Left: downgoing Cerebellar: normal finger-to-nose but slowed  Gait: not tested       Laboratory Studies:   Basic Metabolic Panel:  Recent Labs Lab 05/27/15 1036 05/28/15 0428 05/29/15 0420  NA 136 136 135  K 4.0 3.2* 3.6  CL 100*  102 100*  CO2 29 25 27   GLUCOSE 109* 100* 97  BUN 21* 19 19  CREATININE 1.20* 0.94 0.97  CALCIUM 9.6 9.0 8.9    Liver Function Tests:  Recent Labs Lab 05/27/15 1036  AST 28  ALT 11*  ALKPHOS 75  BILITOT 0.8  PROT 7.3  ALBUMIN 4.1   No results for input(s): LIPASE, AMYLASE in the last 168 hours. No results for input(s): AMMONIA in the last 168 hours.  CBC:  Recent Labs Lab 05/27/15 1036 05/28/15 0428  WBC 9.2 7.2  NEUTROABS 7.8*  --   HGB 13.1 12.6  HCT 39.5 38.1  MCV 81.5 82.8  PLT 191 151    Cardiac Enzymes:  Recent Labs Lab 05/27/15 1036  TROPONINI 0.03    BNP: Invalid input(s): POCBNP  CBG: No results for input(s): GLUCAP in the last 168 hours.  Microbiology: Results for orders placed or performed during the hospital encounter of 05/27/15  Urine culture     Status: None   Collection Time: 05/27/15 11:28 AM  Result Value Ref Range Status   Specimen Description URINE, RANDOM  Final   Special Requests NONE  Final   Culture NO GROWTH 2 DAYS  Final   Report Status 05/29/2015 FINAL  Final  MRSA PCR Screening     Status: Abnormal   Collection Time: 05/29/15  7:32 PM  Result Value Ref Range Status   MRSA by PCR POSITIVE (A) NEGATIVE Final    Comment:        The GeneXpert MRSA Assay (FDA approved for NASAL specimens only), is one component of a comprehensive MRSA colonization surveillance program. It is not intended to diagnose MRSA infection nor to guide or monitor treatment for MRSA infections. CRITICAL RESULT CALLED TO, READ BACK BY AND VERIFIED WITH:  MARCELLE TURNER AT 2135 05/29/15 SDR     Coagulation Studies: No results for input(s): LABPROT, INR in the last 72 hours.  Urinalysis:   Recent Labs Lab 05/27/15 1128  COLORURINE YELLOW*  LABSPEC 1.016  PHURINE 6.0  GLUCOSEU 50*  HGBUR 2+*  BILIRUBINUR NEGATIVE  KETONESUR NEGATIVE  PROTEINUR >500*  NITRITE NEGATIVE  LEUKOCYTESUR NEGATIVE    Lipid Panel:     Component Value  Date/Time   CHOL 174 03/14/2013 0623   TRIG 126 03/14/2013 0623   HDL 37* 03/14/2013 0623   VLDL 25 03/14/2013 0623   LDLCALC 112* 03/14/2013 0623    HgbA1C: No results found for: HGBA1C  Urine Drug Screen:  No results found for: LABOPIA, COCAINSCRNUR, LABBENZ, AMPHETMU, THCU, LABBARB  Alcohol Level: No results for input(s): ETH in the last 168 hours.  Other results: EKG: normal EKG, normal sinus rhythm, unchanged from previous tracings.  Imaging: Mr [name] Wo Contrast  05/29/2015  CLINICAL DATA:  82 year old female with weakness and altered mental status progressively for 2 days. Increased tremor. Initial encounter. EXAM: MRI HEAD WITHOUT CONTRAST MRA HEAD WITHOUT CONTRAST TECHNIQUE: Multiplanar, multiecho pulse sequences of the brain and surrounding structures were obtained without intravenous contrast. Angiographic images of the head were obtained using MRA technique without contrast. COMPARISON:  Noncontrast head CT 05/27/2015.  Brain MRI 12/06/2011. FINDINGS: MRI HEAD FINDINGS Major intracranial vascular flow voids are stable since 2013, the right ICA siphon appears mildly dolichoectasia attic. Chronic bilateral corona radiata lacunar infarcts tracking to the basal ganglia are stable since 2013. Patchy cerebral white matter and pontine T2 hyperintensity appears mildly progressed. Chronic lacunar infarcts in the cerebellum have mildly increased (series 13, image 7 on the right). Scattered chronic micro hemorrhages. No restricted diffusion to suggest acute infarction. No midline shift, mass effect, evidence of mass lesion, ventriculomegaly, extra-axial collection or acute intracranial hemorrhage. Cervicomedullary junction and pituitary are within normal limits. Negative visualized cervical spine. No supratentorial cortical encephalomalacia. Visualized internal auditory structures appear stable and within normal limits. Negative mastoid air cells. Trace paranasal sinus mucosal thickening.  Stable orbit and scalp soft tissues. Bone marrow signal is stable and within normal limits. MRA HEAD FINDINGS Antegrade flow in the posterior circulation with no distal vertebral artery stenosis. Mildly dominant distal left vertebral artery. Normal PICA origins and vertebrobasilar junction. No basilar artery stenosis. Normal SCA and PCA origins. Normal right posterior communicating artery, the left is diminutive or absent. Bilateral PCA branches are within normal limits. Antegrade flow in both ICA siphons. Mild fusiform enlargement of the right cavernous segment, up to 7 mm diameter. No siphon stenosis. Normal ophthalmic and right posterior communicating artery origins. Patent carotid termini. Normal MCA and ACA origins. Diminutive anterior communicating artery. Visualized bilateral ACA branches are within normal limits. MCA M1 segments and bifurcations are within normal limits. Visualized bilateral MCA branches are within normal limits. IMPRESSION: 1.  No acute intracranial abnormality. 2. Chronic small vessel ischemia, mild progression in the cerebellum since 2013. 3. Negative intracranial MRA aside from dolichoectasia of the right ICA siphon. Electronically Signed   By: Odessa Fleming M.D.   On: 05/29/2015 11:37   Mr Brain Wo Contrast  05/29/2015  CLINICAL DATA:  82 year old female with weakness and altered mental status progressively for 2 days. Increased tremor. Initial encounter. EXAM: MRI HEAD WITHOUT CONTRAST MRA HEAD WITHOUT CONTRAST TECHNIQUE: Multiplanar, multiecho pulse sequences of the brain and surrounding structures were obtained without intravenous contrast. Angiographic images of the head were obtained using MRA technique without contrast. COMPARISON:  Noncontrast head CT 05/27/2015.  Brain MRI 12/06/2011. FINDINGS: MRI HEAD FINDINGS Major intracranial vascular flow voids are stable since 2013, the right ICA siphon appears mildly dolichoectasia attic. Chronic bilateral corona radiata lacunar infarcts  tracking to the basal ganglia are stable since 2013. Patchy cerebral white matter and pontine T2 hyperintensity appears mildly progressed. Chronic lacunar infarcts in the cerebellum have mildly increased (series 13, image 7 on the right). Scattered chronic micro hemorrhages. No restricted diffusion to suggest acute infarction. No midline shift, mass effect, evidence of mass lesion, ventriculomegaly, extra-axial collection or acute intracranial hemorrhage. Cervicomedullary junction and pituitary are within normal limits. Negative visualized cervical spine. No supratentorial cortical encephalomalacia. Visualized internal auditory structures appear stable and within normal limits. Negative mastoid air cells. Trace paranasal sinus mucosal thickening. Stable orbit and scalp soft tissues. Bone marrow signal is stable and within normal limits. MRA HEAD FINDINGS Antegrade flow in the posterior circulation with no distal vertebral artery stenosis. Mildly dominant distal left vertebral artery. Normal PICA origins and vertebrobasilar junction. No basilar artery stenosis. Normal SCA and PCA origins. Normal right posterior communicating artery, the left is diminutive or absent. Bilateral PCA branches are within normal limits. Antegrade flow in both ICA siphons. Mild fusiform enlargement of the right cavernous segment, up to 7 mm diameter. No  siphon stenosis. Normal ophthalmic and right posterior communicating artery origins. Patent carotid termini. Normal MCA and ACA origins. Diminutive anterior communicating artery. Visualized bilateral ACA branches are within normal limits. MCA M1 segments and bifurcations are within normal limits. Visualized bilateral MCA branches are within normal limits. IMPRESSION: 1.  No acute intracranial abnormality. 2. Chronic small vessel ischemia, mild progression in the cerebellum since 2013. 3. Negative intracranial MRA aside from dolichoectasia of the right ICA siphon. Electronically Signed   By:  Odessa FlemingH  Hall M.D.   On: 05/29/2015 11:37     Assessment/Plan:  82 y.o. female Pt lives in assisted living facility. Mentation has been declining since Monday. Pt has been having generalized weakness as well as tremor which appears to be essential in nature ( worse with holding objects)  Mentation up and down. Consistent with delirium.  Imaging reviewed. No acute abnormalities, no intracranial vessel abnormalities.  - PT/OT - out of be to chair, d/c planning.   - s/p discussion with family and pt at bedside.  Pauletta BrownsZEYLIKMAN, Levenia Skalicky  05/30/2015, 11:11 AM

## 2015-05-30 NOTE — Care Management Note (Signed)
Case Management Note  Patient Details  Name: Martha Hunter MRN: [id number] Date of Birth: 1928-05-16  Subjective/Objective:      [facility] In-house PT and OT was set up yesterday with [facility] by [facility] per [facility] has its own Rehab department.               Action/Plan:   Expected Discharge Date:                  Expected Discharge Plan:     In-House Referral:     Discharge planning Services     Post Acute Care Choice:    Choice offered to:     DME Arranged:    DME Agency:     HH Arranged:    HH Agency:     Status of Service:     Medicare Important Message Given:    Date Medicare IM Given:    Medicare IM give by:    Date Additional Medicare IM Given:    Additional Medicare Important Message give by:     If discussed at Long Length of Stay Meetings, dates discussed:    Additional Comments:  Martha Hattery A, RN 05/30/2015, 10:32 AM

## 2015-05-30 NOTE — Progress Notes (Signed)
[facility] , family to transport , summary and f/u care given .

## 2015-05-30 NOTE — Discharge Summary (Signed)
[facility] [place] [facility]   PATIENT NAME: Martha Hunter    MR#:  [id number]  DATE OF BIRTH:  [birth year]  DATE OF ADMISSION:  05/27/2015 ADMITTING PHYSICIAN: Houston Siren, MD  DATE OF DISCHARGE: 05/30/2015  PRIMARY CARE PHYSICIAN: No primary care provider on file.    ADMISSION DIAGNOSIS:  Weakness [R53.1] Non-intractable vomiting without nausea, vomiting of unspecified type [R11.11]  DISCHARGE DIAGNOSIS:  Metabolic encephalopathy   SECONDARY DIAGNOSIS:   Past Medical History  Diagnosis Date  . Hypertension   . Hypercholesteremia   . Depression   . Staph infection   . CAP (community acquired pneumonia)     a. 07/2014.  Marland Kitchen History of TIA (transient ischemic attack)     a. on plavix chronically.  . Cystocele   . Rectocele   . Bladder spasm   . Hyperlipidemia   . Vaginal atrophy   . Bladder instability   . UTI (lower urinary tract infection)     HOSPITAL COURSE:  Martha Hunter  is a 82 y.o. female admitted 05/27/2015 with chief complaint Altered Mental Status . Please see H&P performed by Houston Siren, MD for further information. She has been evaluated by neurology during her stay including MRI without abnormal findings. She did have some improvement in mental status back to baseline but wax/waned. Weakness remains an issue she will be followed by PT at her living facility.  DISCHARGE CONDITIONS:   stable  CONSULTS OBTAINED:  Treatment Team:  Pauletta Browns, MD Wyatt Haste, MD  DRUG ALLERGIES:   Allergies  Allergen Reactions  . Niacin And Related   . Nitrates, Organic Other (See Comments)    Reaction: unknown  . Penicillins Other (See Comments)    Reaction: unknown  . Sulfa Antibiotics Other (See Comments)    Reaction: unknown     DISCHARGE MEDICATIONS:   Current Discharge Medication List    CONTINUE these medications which have NOT CHANGED   Details  acetaminophen (TYLENOL) 500 MG tablet Take 500-1,000 mg by  mouth every 6 (six) hours as needed for mild pain.    alum & mag hydroxide-simeth (MAALOX/MYLANTA) 200-200-20 MG/5ML suspension Take 30 mLs by mouth as needed for indigestion or heartburn.    amLODipine (NORVASC) 5 MG tablet Take 5 mg by mouth at bedtime.     calcium carbonate (TUMS EX) 750 MG chewable tablet Chew 1 tablet by mouth every 4 (four) hours as needed for heartburn.    Cholecalciferol (VITAMIN D-3) 1000 UNITS CAPS Take 1 capsule by mouth daily.     clopidogrel (PLAVIX) 75 MG tablet Take 75 mg by mouth every morning.     diazepam (VALIUM) 5 MG tablet Take 5 mg by mouth at bedtime.     estradiol (ESTRACE VAGINAL) 0.1 MG/GM vaginal cream Place 1 Applicatorful vaginally 2 (two) times a week. 1/2 gram twice weekly. Qty: 42.5 g, Refills: 12    ferrous sulfate 325 (65 FE) MG tablet Take 325 mg by mouth daily at 12 noon.     hydrALAZINE (APRESOLINE) 50 MG tablet Take 50 mg by mouth 2 (two) times daily.     loperamide (IMODIUM) 2 MG capsule Take 2 mg by mouth 3 (three) times daily as needed for diarrhea or loose stools.    losartan (COZAAR) 100 MG tablet Take 100 mg by mouth every morning.     omeprazole (PRILOSEC) 20 MG capsule Take 20 mg by mouth daily.    ondansetron (ZOFRAN) 4 MG tablet Take  4 mg by mouth every 8 (eight) hours as needed for nausea or vomiting.    pravastatin (PRAVACHOL) 20 MG tablet Take 20 mg by mouth every evening.     Probiotic Product (PROBIOTIC DAILY PO) Take 1 tablet by mouth every morning.    sertraline (ZOLOFT) 100 MG tablet Take 100 mg by mouth every morning.     Zinc Oxide (DESITIN) 13 % CREA Apply topically. MAY USE TO THE GROIN AND VULVA FOUR TIMES DAILY AS NEEDED TO PROTECT SKIN FROM URINE AND DEPENDS PROTECTIVE WEAR.         DISCHARGE INSTRUCTIONS:    DIET:  Regular diet  DISCHARGE CONDITION:  Stable  ACTIVITY:  Activity as tolerated  OXYGEN:  Home Oxygen: No.   Oxygen Delivery: room air  DISCHARGE LOCATION:  nursing home    If you experience worsening of your admission symptoms, develop shortness of breath, life threatening emergency, suicidal or homicidal thoughts you must seek medical attention immediately by calling 911 or calling your MD immediately  if symptoms less severe.  You Must read complete instructions/literature along with all the possible adverse reactions/side effects for all the Medicines you take and that have been prescribed to you. Take any new Medicines after you have completely understood and accpet all the possible adverse reactions/side effects.   Please note  You were cared for by a hospitalist during your hospital stay. If you have any questions about your discharge medications or the care you received while you were in the hospital after you are discharged, you can call the unit and asked to speak with the hospitalist on call if the hospitalist that took care of you is not available. Once you are discharged, your primary care physician will handle any further medical issues. Please note that NO REFILLS for any discharge medications will be authorized once you are discharged, as it is imperative that you return to your primary care physician (or establish a relationship with a primary care physician if you do not have one) for your aftercare needs so that they can reassess your need for medications and monitor your lab values.    On the day of Discharge:   VITAL SIGNS:  Blood pressure 161/65, pulse 72, temperature 98.5 F (36.9 C), temperature source Oral, resp. rate 20, height 5' (1.524 m), weight 63.141 kg (139 lb 3.2 oz), SpO2 98 %.  I/O:   Intake/Output Summary (Last 24 hours) at 05/30/15 1149 Last data filed at 05/30/15 1610  Gross per 24 hour  Intake 871.25 ml  Output      0 ml  Net 871.25 ml    PHYSICAL EXAMINATION:  GENERAL:  82 y.o.-year-old patient lying in the bed with no acute distress.  EYES: Pupils equal, round, reactive to light and accommodation. No scleral  icterus. Extraocular muscles intact.  HEENT: Head atraumatic, normocephalic. Oropharynx and nasopharynx clear.  NECK:  Supple, no jugular venous distention. No thyroid enlargement, no tenderness.  LUNGS: Normal breath sounds bilaterally, no wheezing, rales,rhonchi or crepitation. No use of accessory muscles of respiration.  CARDIOVASCULAR: S1, S2 normal. No murmurs, rubs, or gallops.  ABDOMEN: Soft, non-tender, non-distended. Bowel sounds present. No organomegaly or mass.  EXTREMITIES: No pedal edema, cyanosis, or clubbing.  NEUROLOGIC: Cranial nerves II through XII are intact. Muscle strength 5/5 in all extremities. Sensation intact. Gait not checked.  PSYCHIATRIC: The patient is alert and oriented x 3.  SKIN: No obvious rash, lesion, or ulcer.   DATA REVIEW:   CBC  Recent  Labs Lab 05/28/15 0428  WBC 7.2  HGB 12.6  HCT 38.1  PLT 151    Chemistries   Recent Labs Lab 05/27/15 1036  05/29/15 0420  NA 136  < > 135  K 4.0  < > 3.6  CL 100*  < > 100*  CO2 29  < > 27  GLUCOSE 109*  < > 97  BUN 21*  < > 19  CREATININE 1.20*  < > 0.97  CALCIUM 9.6  < > 8.9  AST 28  --   --   ALT 11*  --   --   ALKPHOS 75  --   --   BILITOT 0.8  --   --   < > = values in this interval not displayed.  Cardiac Enzymes  Recent Labs Lab 05/27/15 1036  TROPONINI 0.03    Microbiology Results  Results for orders placed or performed during the hospital encounter of 05/27/15  Urine culture     Status: None   Collection Time: 05/27/15 11:28 AM  Result Value Ref Range Status   Specimen Description URINE, RANDOM  Final   Special Requests NONE  Final   Culture NO GROWTH 2 DAYS  Final   Report Status 05/29/2015 FINAL  Final  MRSA PCR Screening     Status: Abnormal   Collection Time: 05/29/15  7:32 PM  Result Value Ref Range Status   MRSA by PCR POSITIVE (A) NEGATIVE Final    Comment:        The GeneXpert MRSA Assay (FDA approved for NASAL specimens only), is one component of  a comprehensive MRSA colonization surveillance program. It is not intended to diagnose MRSA infection nor to guide or monitor treatment for MRSA infections. CRITICAL RESULT CALLED TO, READ BACK BY AND VERIFIED WITH:  MARCELLE TURNER AT 2135 05/29/15 SDR     RADIOLOGY:  Mr Shirlee LatchMra Head Wo Contrast  05/29/2015  CLINICAL DATA:  82 year old female with weakness and altered mental status progressively for 2 days. Increased tremor. Initial encounter. EXAM: MRI HEAD WITHOUT CONTRAST MRA HEAD WITHOUT CONTRAST TECHNIQUE: Multiplanar, multiecho pulse sequences of the brain and surrounding structures were obtained without intravenous contrast. Angiographic images of the head were obtained using MRA technique without contrast. COMPARISON:  Noncontrast head CT 05/27/2015.  Brain MRI 12/06/2011. FINDINGS: MRI HEAD FINDINGS Major intracranial vascular flow voids are stable since 2013, the right ICA siphon appears mildly dolichoectasia attic. Chronic bilateral corona radiata lacunar infarcts tracking to the basal ganglia are stable since 2013. Patchy cerebral white matter and pontine T2 hyperintensity appears mildly progressed. Chronic lacunar infarcts in the cerebellum have mildly increased (series 13, image 7 on the right). Scattered chronic micro hemorrhages. No restricted diffusion to suggest acute infarction. No midline shift, mass effect, evidence of mass lesion, ventriculomegaly, extra-axial collection or acute intracranial hemorrhage. Cervicomedullary junction and pituitary are within normal limits. Negative visualized cervical spine. No supratentorial cortical encephalomalacia. Visualized internal auditory structures appear stable and within normal limits. Negative mastoid air cells. Trace paranasal sinus mucosal thickening. Stable orbit and scalp soft tissues. Bone marrow signal is stable and within normal limits. MRA HEAD FINDINGS Antegrade flow in the posterior circulation with no distal vertebral artery  stenosis. Mildly dominant distal left vertebral artery. Normal PICA origins and vertebrobasilar junction. No basilar artery stenosis. Normal SCA and PCA origins. Normal right posterior communicating artery, the left is diminutive or absent. Bilateral PCA branches are within normal limits. Antegrade flow in both ICA siphons. Mild fusiform enlargement of  the right cavernous segment, up to 7 mm diameter. No siphon stenosis. Normal ophthalmic and right posterior communicating artery origins. Patent carotid termini. Normal MCA and ACA origins. Diminutive anterior communicating artery. Visualized bilateral ACA branches are within normal limits. MCA M1 segments and bifurcations are within normal limits. Visualized bilateral MCA branches are within normal limits. IMPRESSION: 1.  No acute intracranial abnormality. 2. Chronic small vessel ischemia, mild progression in the cerebellum since 2013. 3. Negative intracranial MRA aside from dolichoectasia of the right ICA siphon. Electronically Signed   By: Odessa Fleming M.D.   On: 05/29/2015 11:37   Mr Brain Wo Contrast  05/29/2015  CLINICAL DATA:  82 year old female with weakness and altered mental status progressively for 2 days. Increased tremor. Initial encounter. EXAM: MRI HEAD WITHOUT CONTRAST MRA HEAD WITHOUT CONTRAST TECHNIQUE: Multiplanar, multiecho pulse sequences of the brain and surrounding structures were obtained without intravenous contrast. Angiographic images of the head were obtained using MRA technique without contrast. COMPARISON:  Noncontrast head CT 05/27/2015.  Brain MRI 12/06/2011. FINDINGS: MRI HEAD FINDINGS Major intracranial vascular flow voids are stable since 2013, the right ICA siphon appears mildly dolichoectasia attic. Chronic bilateral corona radiata lacunar infarcts tracking to the basal ganglia are stable since 2013. Patchy cerebral white matter and pontine T2 hyperintensity appears mildly progressed. Chronic lacunar infarcts in the cerebellum have  mildly increased (series 13, image 7 on the right). Scattered chronic micro hemorrhages. No restricted diffusion to suggest acute infarction. No midline shift, mass effect, evidence of mass lesion, ventriculomegaly, extra-axial collection or acute intracranial hemorrhage. Cervicomedullary junction and pituitary are within normal limits. Negative visualized cervical spine. No supratentorial cortical encephalomalacia. Visualized internal auditory structures appear stable and within normal limits. Negative mastoid air cells. Trace paranasal sinus mucosal thickening. Stable orbit and scalp soft tissues. Bone marrow signal is stable and within normal limits. MRA HEAD FINDINGS Antegrade flow in the posterior circulation with no distal vertebral artery stenosis. Mildly dominant distal left vertebral artery. Normal PICA origins and vertebrobasilar junction. No basilar artery stenosis. Normal SCA and PCA origins. Normal right posterior communicating artery, the left is diminutive or absent. Bilateral PCA branches are within normal limits. Antegrade flow in both ICA siphons. Mild fusiform enlargement of the right cavernous segment, up to 7 mm diameter. No siphon stenosis. Normal ophthalmic and right posterior communicating artery origins. Patent carotid termini. Normal MCA and ACA origins. Diminutive anterior communicating artery. Visualized bilateral ACA branches are within normal limits. MCA M1 segments and bifurcations are within normal limits. Visualized bilateral MCA branches are within normal limits. IMPRESSION: 1.  No acute intracranial abnormality. 2. Chronic small vessel ischemia, mild progression in the cerebellum since 2013. 3. Negative intracranial MRA aside from dolichoectasia of the right ICA siphon. Electronically Signed   By: Odessa Fleming M.D.   On: 05/29/2015 11:37     Management plans discussed with the patient, family and they are in agreement.  CODE STATUS:     Code Status Orders        Start      Ordered   05/27/15 1527  Full code   Continuous     05/27/15 1526    Code Status History    Date Active Date Inactive Code Status Order ID Comments User Context   07/20/2014  5:54 AM 07/23/2014  6:35 PM Full Code [id number]  Arnaldo Natal, MD Inpatient    Advance Directive Documentation        Most Recent Value   Type of Advance  Directive  Healthcare Power of Attorney, Living will   Pre-existing out of facility DNR order (yellow form or pink MOST form)     "MOST" Form in Place?        TOTAL TIME TAKING CARE OF THIS PATIENT: 33 minutes.    Zamar Odwyer,  Mardi Mainland.D on 05/30/2015 at 11:49 AM  Between 7am to 6pm - Pager - [phone]  After 6pm go to [email or web address] - Scientist, research (life sciences) [facility]  Office  [phone]  CC: Primary care physician; No primary care provider on file.

## 2015-05-30 NOTE — NC FL2 (Signed)
[place] MEDICAID FL2 LEVEL OF CARE SCREENING TOOL     IDENTIFICATION  Patient Name: Martha Hunter Birthdate: 1928-03-26 Sex: female Admission Date (Current Location): 05/27/2015  [facility] and [place] Number:  Chiropodist and Address:  [facility], [address] [place], [place] [address]      Provider Number: [id number]  Attending Physician Name and Address:  Wyatt Haste, MD  Relative Name and Phone Number:       Current Level of Care: Hospital Recommended Level of Care: Assisted Living Facility Prior Approval Number:    Date Approved/Denied:   PASRR Number:    Discharge Plan: Other (Comment) (return to ALF)    Current Diagnoses: Patient Active Problem List   Diagnosis Date Noted  . Metabolic encephalopathy 05/29/2015  . Altered mental status 05/27/2015  . Anxiety 10/14/2014  . Clinical depression 10/14/2014  . Dizziness 10/14/2014  . HLD (hyperlipidemia) 10/14/2014  . BP (high blood pressure) 10/14/2014  . Adiposity 10/14/2014  . Unstable bladder 10/14/2014  . Cystocele 10/14/2014  . Vaginal atrophy 10/14/2014  . CAP (community acquired pneumonia) 07/23/2014  . Chronic kidney disease (CKD), stage III (moderate) 04/07/2014    Orientation RESPIRATION BLADDER Height & Weight     Self, Time, Situation, Place  Normal Continent Weight: 139 lb 3.2 oz (63.141 kg) Height:  5' (152.4 cm)  BEHAVIORAL SYMPTOMS/MOOD NEUROLOGICAL BOWEL NUTRITION STATUS  Other (Comment) (none)   Continent Diet (regular)  AMBULATORY STATUS COMMUNICATION OF NEEDS Skin   Limited Assist Verbally Normal                       Personal Care Assistance Level of Assistance  Bathing, Feeding, Dressing Bathing Assistance: Limited assistance Feeding assistance: Limited assistance Dressing Assistance: Limited assistance     Functional Limitations Info  Sight, Hearing, Speech Sight Info: Adequate Hearing Info: Adequate Speech Info: Adequate     SPECIAL CARE FACTORS FREQUENCY  PT (By licensed PT), OT (By licensed OT)     PT Frequency: home health at facility OT Frequency: home health at facility            Contractures Contractures Info: Not present    Additional Factors Info  Code Status, Allergies, Psychotropic, Insulin Sliding Scale, Isolation Precautions Code Status Info: Full Code Allergies Info: Niacin And Related, Nitrates, Organic, Penicillins, Sulfa Antibiotics Psychotropic Info: zoloft Insulin Sliding Scale Info: none Isolation Precautions Info: none    Discharge Medications: Please see discharge summary for a list of discharge medications. DISCHARGE MEDICATIONS:   Current Discharge Medication List    CONTINUE these medications which have NOT CHANGED   Details  acetaminophen (TYLENOL) 500 MG tablet Take 500-1,000 mg by mouth every 6 (six) hours as needed for mild pain.    alum & mag hydroxide-simeth (MAALOX/MYLANTA) 200-200-20 MG/5ML suspension Take 30 mLs by mouth as needed for indigestion or heartburn.    amLODipine (NORVASC) 5 MG tablet Take 5 mg by mouth at bedtime.     calcium carbonate (TUMS EX) 750 MG chewable tablet Chew 1 tablet by mouth every 4 (four) hours as needed for heartburn.    Cholecalciferol (VITAMIN D-3) 1000 UNITS CAPS Take 1 capsule by mouth daily.     clopidogrel (PLAVIX) 75 MG tablet Take 75 mg by mouth every morning.     diazepam (VALIUM) 5 MG tablet Take 5 mg by mouth at bedtime.     estradiol (ESTRACE VAGINAL) 0.1 MG/GM vaginal cream Place 1 Applicatorful vaginally 2 (  two) times a week. 1/2 gram twice weekly. Qty: 42.5 g, Refills: 12    ferrous sulfate 325 (65 FE) MG tablet Take 325 mg by mouth daily at 12 noon.     hydrALAZINE (APRESOLINE) 50 MG tablet Take 50 mg by mouth 2 (two) times daily.     loperamide (IMODIUM) 2 MG capsule Take 2 mg by mouth 3 (three) times daily as needed for diarrhea or loose stools.    losartan  (COZAAR) 100 MG tablet Take 100 mg by mouth every morning.     omeprazole (PRILOSEC) 20 MG capsule Take 20 mg by mouth daily.    ondansetron (ZOFRAN) 4 MG tablet Take 4 mg by mouth every 8 (eight) hours as needed for nausea or vomiting.    pravastatin (PRAVACHOL) 20 MG tablet Take 20 mg by mouth every evening.     Probiotic Product (PROBIOTIC DAILY PO) Take 1 tablet by mouth every morning.    sertraline (ZOLOFT) 100 MG tablet Take 100 mg by mouth every morning.     Zinc Oxide (DESITIN) 13 % CREA Apply topically. MAY USE TO THE GROIN AND VULVA FOUR TIMES DAILY AS NEEDED TO PROTECT SKIN FROM URINE AND DEPENDS PROTECTIVE WEAR.              Relevant Imaging Results:  Relevant Lab Results:   Additional Information home health at facility  PT/OT  Haig ProphetMorgan, Ahmya Bernick G, LCSW

## 2015-05-30 NOTE — Care Management Note (Signed)
Case Management Note  Patient Details  Name: Martha Hunter MRN: [id number] Date of Birth: May 07, 1928  Subjective/Objective:      An order for a wheelchair was faxed to [facility] In the meantime [name] [facility].               Action/Plan:   Expected Discharge Date:                  Expected Discharge Plan:     In-House Referral:     Discharge planning Services     Post Acute Care Choice:    Choice offered to:     DME Arranged:    DME Agency:     HH Arranged:    HH Agency:     Status of Service:     Medicare Important Message Given:    Date Medicare IM Given:    Medicare IM give by:    Date Additional Medicare IM Given:    Additional Medicare Important Message give by:     If discussed at Long Length of Stay Meetings, dates discussed:    Additional Comments:  Adela Esteban A, RN 05/30/2015, 12:18 PM

## 2015-05-30 NOTE — Progress Notes (Signed)
Patient is medically stable for D/C back to [facility] today. Per [employer] at [place] patient can return today. Per Cyril Mourning she does not have access to a fax machine on the weekends and requested CSW to send D/C orders in packet. CSW prepared D/C packet. RN Case Manager made arrangements for a wheel chair. CSW met with patient's son Jenny Reichmann and his wife at bedside. Son was upset that patient was in observation status and that Medicare will not pay for STR at a SNF. CSW discussed private pay options at a SNF. Per son patient cannot afford to pay privately at a SNF. Per son he will attempt to transport patient in private vehicle back to [facility]. CSW prepared EMS form if needed. RN aware of above. Please reconsult if future social work needs arise. CSW signing off.   Blima Rich, LCSW [phone]

## 2015-05-31 LAB — HEMOGLOBIN A1C: HEMOGLOBIN A1C: 5.3 % (ref 4.0–6.0)

## 2015-06-15 IMAGING — CR DG CHEST 2V
1 series · 3 of 3 positions shown · non-contrast
Comparison: 12/05/2011

CLINICAL DATA: Chest tightness

EXAM:
CHEST  2 VIEW

[Series 1: w chest lat · 0.14mm/px · 3 of 3 slices shown]
[im 1/3]
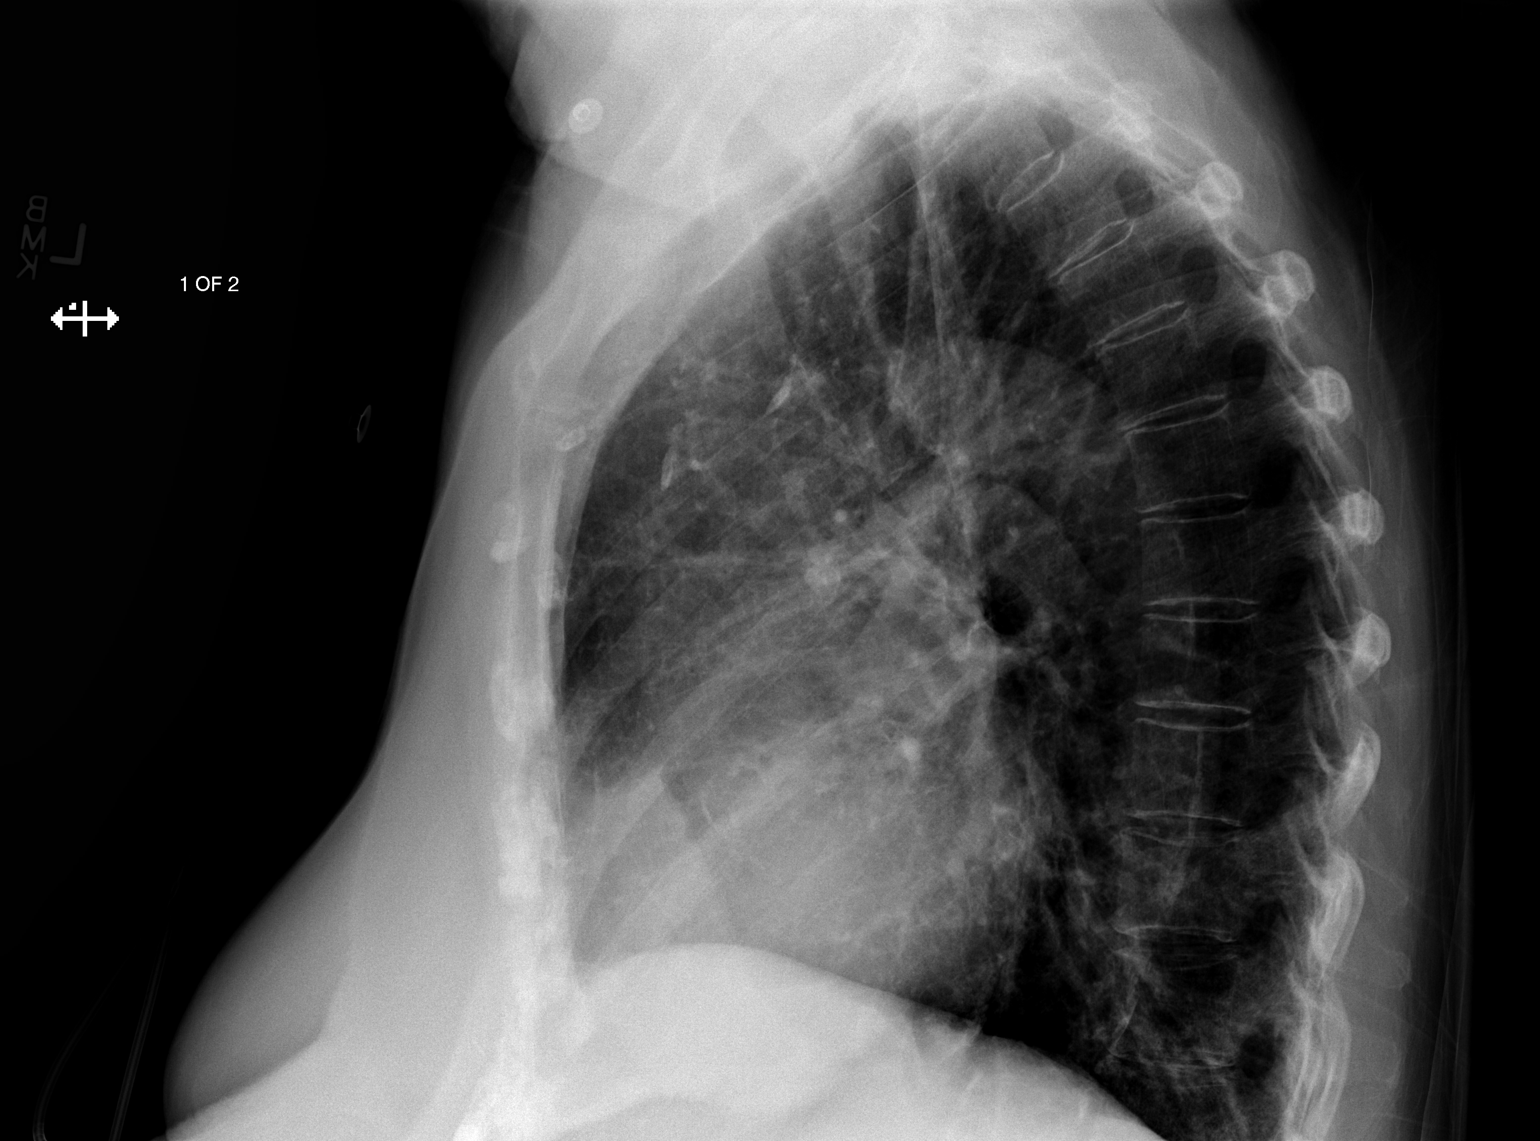
[im 2/3]
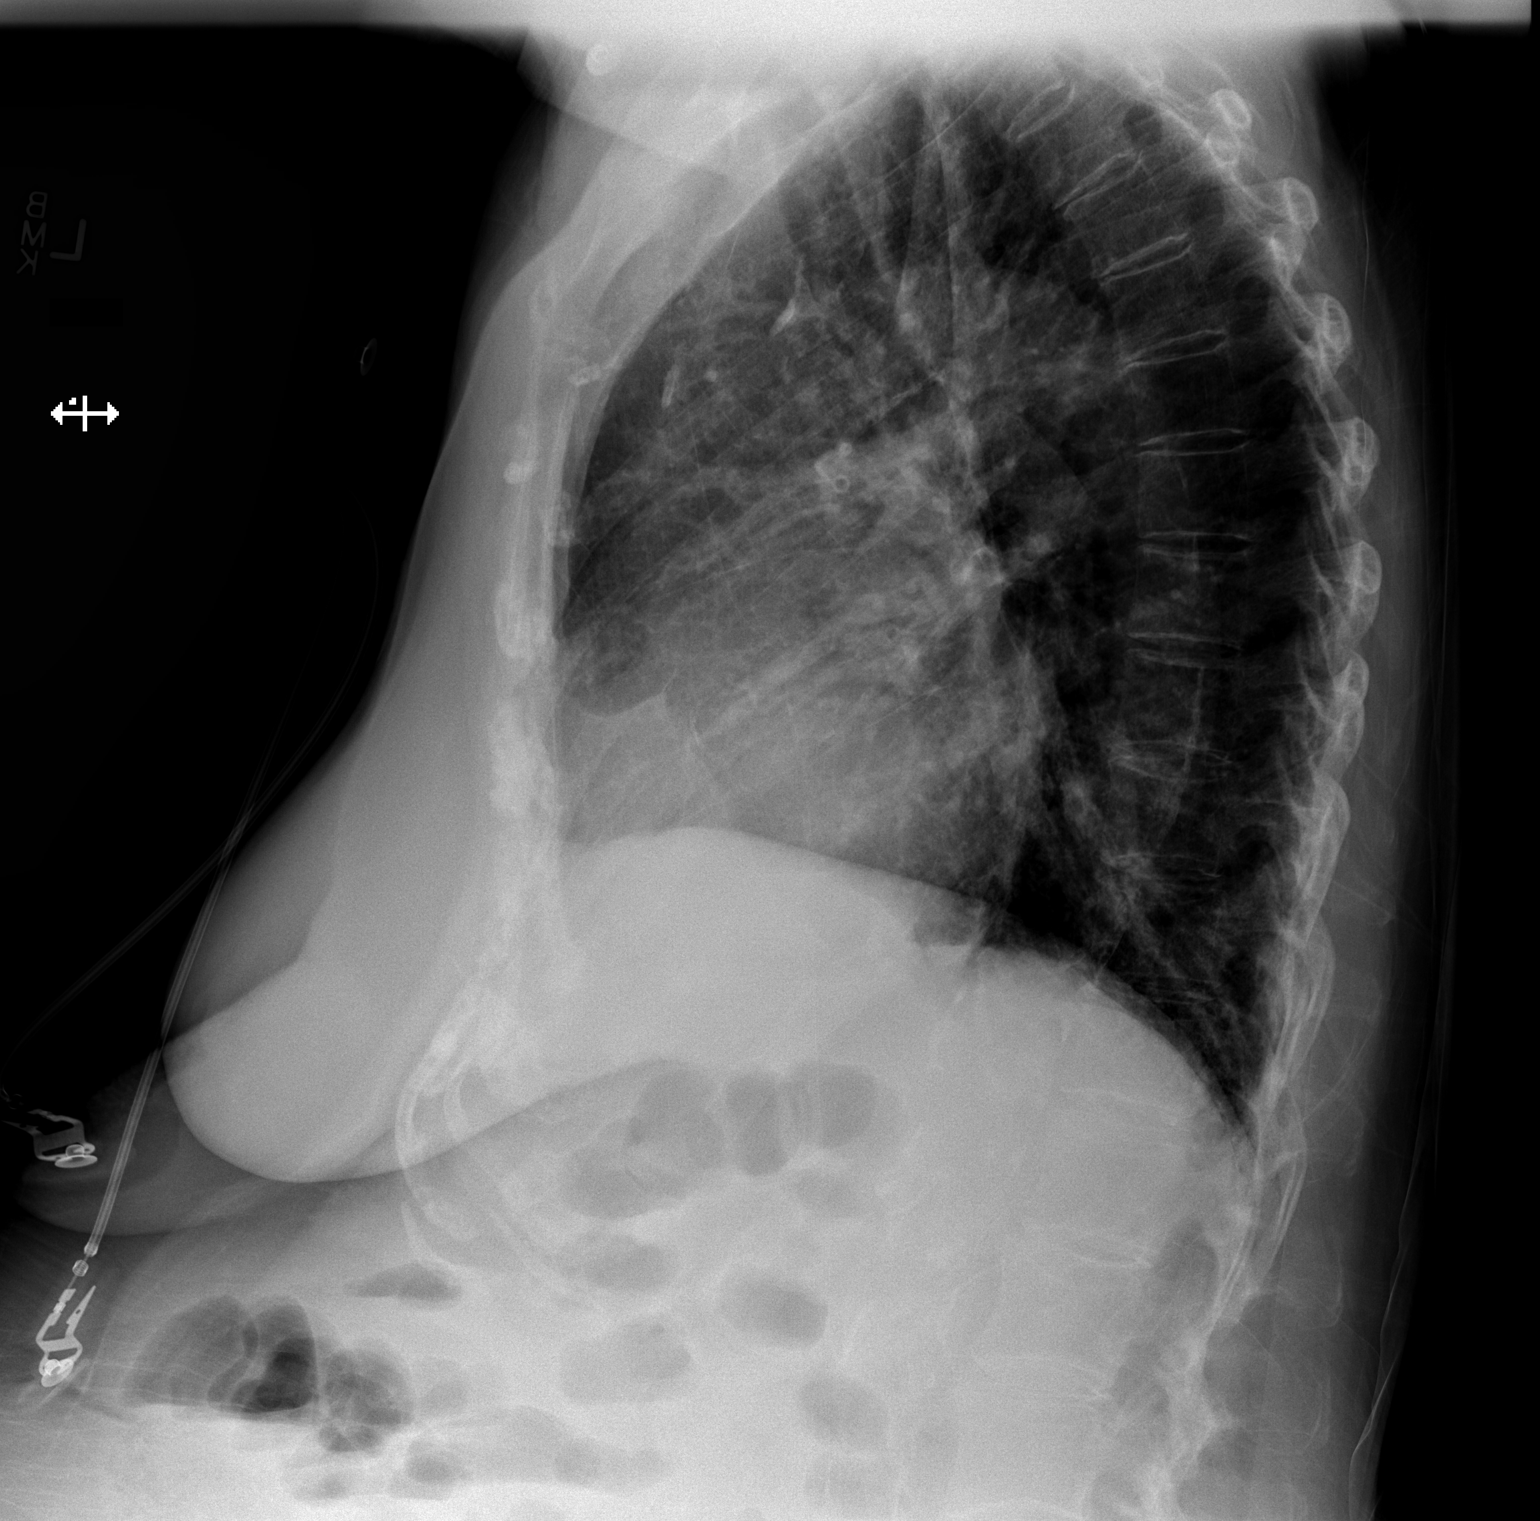
[im 3/3]
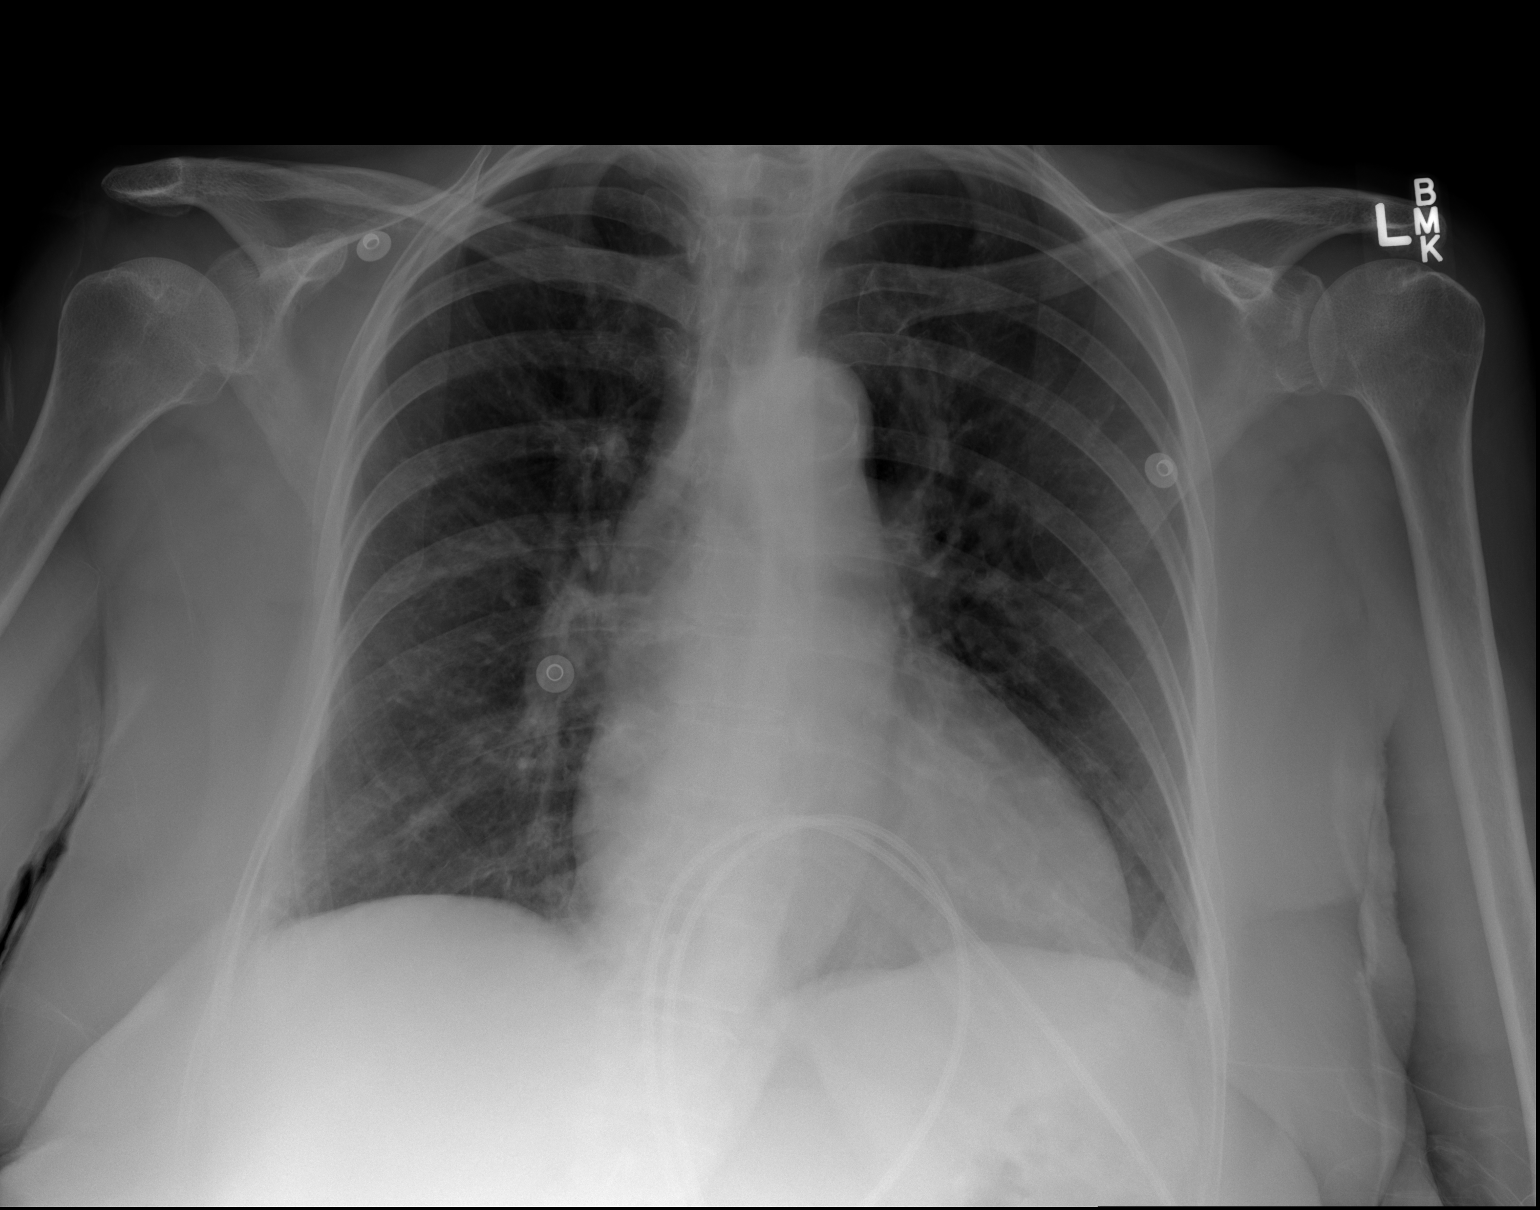

[3 of 3 positions shown; findings below may reference images not displayed]

FINDINGS: Cardiomegaly without CHF or edema. No focal pneumonia, collapse or
consolidation. No effusion or pneumothorax. Trachea midline.
Atherosclerosis of the aorta. No osseous abnormality. Mild
hyperinflation noted.
IMPRESSION: Cardiomegaly with mild hyperinflation. No interval change or acute
process

## 2015-06-23 ENCOUNTER — Encounter: Payer: Self-pay | Admitting: Obstetrics and Gynecology

## 2015-06-23 ENCOUNTER — Ambulatory Visit (INDEPENDENT_AMBULATORY_CARE_PROVIDER_SITE_OTHER): Payer: Medicare Other | Admitting: Obstetrics and Gynecology

## 2015-06-23 VITALS — BP 160/82 | HR 77 | Ht 60.0 in | Wt 140.1 lb

## 2015-06-23 DIAGNOSIS — N952 Postmenopausal atrophic vaginitis: Secondary | ICD-10-CM

## 2015-06-23 DIAGNOSIS — N3289 Other specified disorders of bladder: Secondary | ICD-10-CM | POA: Diagnosis not present

## 2015-06-23 DIAGNOSIS — N762 Acute vulvitis: Secondary | ICD-10-CM | POA: Diagnosis not present

## 2015-06-23 NOTE — Patient Instructions (Signed)
1. Continue oxybutynin 5 mg at night 2. Return in 6 months for follow-up

## 2015-06-23 NOTE — Progress Notes (Signed)
Patient ID: Martha Hunter, female   DOB: [birth year], 82 y.o.   MRN: [id number]  Subjective:  Chief Complaint: 1. Unstable bladder 2. Vulvitis 3. Vaginal atrophy  Patient is 82 yo, G2P2, post-menopausal, on intra-vaginal estradiol cream, presenting today for follow up check of vulvitis and unstable bladder.  Patient family states vast improvement in control of inflammtion and yeast infections [facility].  Family unsure if patient is still taking oxybutynin 5mg  at night, asking facility to fax over an updated medication list for the patient.  Objective: BP 160/82 mmHg  Pulse 77  Ht 5' (1.524 m)  Wt 140 lb 1.6 oz (63.549 kg)  BMI 27.36 kg/m2  Physical Exam: Groin: mild erythema, no prominent rash or lesions present.  External Genitalia: minimal erythema, no active yeast infection present. Dark brown/green stool around rectum.  Assessment: 1. Unstable bladder 2. Vaginal atrophy 3. Vulvitis - resolved  Plan: 1. Start back or continue oxybutynin 5mg  nightly to help unstable bladder symptoms. 2. Vulvitis and groin irritation appear resolved, continue frequent change of depends to keep area clean and dry. 3. Follow up in 6 months  A total of 15 minutes were spent face-to-face with the patient during this encounter and over half of that time dealt with counseling and coordination of care.  Etheleen SiaLindsay P Margaretta Chittum, PA-S Herold HarmsMartin A Defrancesco, MD   I have seen, interviewed, and examined the patient in conjunction with the [employer] and affirm the diagnosis and management plan. Martin A. DeFrancesco, MD, FACOG   Note: This dictation was prepared with Dragon dictation along with smaller phrase technology. Any transcriptional errors that result from this process are unintentional.

## 2015-07-15 NOTE — Progress Notes (Addendum)
   05/29/15 1613  OT Time Calculation  OT Start Time (ACUTE ONLY) 1435  OT Stop Time (ACUTE ONLY) 1500  OT Time Calculation (min) 25 min  OT G-codes **NOT FOR INPATIENT CLASS**  Functional Assessment Tool Used Clinical Judgement based on pt. current functional status  Functional Limitation Self care  Self Care Current Status (Z6109(G8987) CL  Self Care Goal Status (U0454(G8988) CJ  OT General Charges  $OT Visit 1 Procedure  OT Evaluation  $OT Eval High Complexity 1 Procedure  Late Entry G-Code entered by Olegario MessierElaine Daud Cayer, MS, ORT/L, after chart review, assessment performed and documented by Olegario MessierElaine Jaila Schellhorn, MS, OTR/L

## 2015-07-16 ENCOUNTER — Emergency Department: Payer: Medicare Other

## 2015-07-16 ENCOUNTER — Inpatient Hospital Stay
Admission: EM | Admit: 2015-07-16 | Discharge: 2015-07-20 | DRG: 641 | Disposition: A | Discharge status: Skilled Nursing Facility | Payer: Medicare Other | Attending: Internal Medicine | Admitting: Internal Medicine

## 2015-07-16 ENCOUNTER — Observation Stay
Admit: 2015-07-16 | Discharge: 2015-07-16 | Disposition: A | Discharge status: Home / Self Care | Payer: Medicare Other | Attending: Internal Medicine | Admitting: Internal Medicine

## 2015-07-16 ENCOUNTER — Encounter: Payer: Self-pay | Admitting: Emergency Medicine

## 2015-07-16 DIAGNOSIS — Z8249 Family history of ischemic heart disease and other diseases of the circulatory system: Secondary | ICD-10-CM

## 2015-07-16 DIAGNOSIS — R748 Abnormal levels of other serum enzymes: Secondary | ICD-10-CM | POA: Diagnosis present

## 2015-07-16 DIAGNOSIS — R001 Bradycardia, unspecified: Secondary | ICD-10-CM | POA: Diagnosis present

## 2015-07-16 DIAGNOSIS — E860 Dehydration: Secondary | ICD-10-CM | POA: Diagnosis present

## 2015-07-16 DIAGNOSIS — R778 Other specified abnormalities of plasma proteins: Secondary | ICD-10-CM

## 2015-07-16 DIAGNOSIS — Z87891 Personal history of nicotine dependence: Secondary | ICD-10-CM

## 2015-07-16 DIAGNOSIS — R0602 Shortness of breath: Secondary | ICD-10-CM

## 2015-07-16 DIAGNOSIS — E669 Obesity, unspecified: Secondary | ICD-10-CM | POA: Diagnosis present

## 2015-07-16 DIAGNOSIS — I129 Hypertensive chronic kidney disease with stage 1 through stage 4 chronic kidney disease, or unspecified chronic kidney disease: Secondary | ICD-10-CM | POA: Diagnosis present

## 2015-07-16 DIAGNOSIS — R7989 Other specified abnormal findings of blood chemistry: Secondary | ICD-10-CM

## 2015-07-16 DIAGNOSIS — R531 Weakness: Secondary | ICD-10-CM

## 2015-07-16 DIAGNOSIS — J209 Acute bronchitis, unspecified: Secondary | ICD-10-CM | POA: Diagnosis present

## 2015-07-16 DIAGNOSIS — E876 Hypokalemia: Principal | ICD-10-CM | POA: Diagnosis present

## 2015-07-16 DIAGNOSIS — Z79899 Other long term (current) drug therapy: Secondary | ICD-10-CM

## 2015-07-16 DIAGNOSIS — Z888 Allergy status to other drugs, medicaments and biological substances status: Secondary | ICD-10-CM

## 2015-07-16 DIAGNOSIS — Z882 Allergy status to sulfonamides status: Secondary | ICD-10-CM

## 2015-07-16 DIAGNOSIS — Z880 Allergy status to penicillin: Secondary | ICD-10-CM

## 2015-07-16 DIAGNOSIS — N179 Acute kidney failure, unspecified: Secondary | ICD-10-CM | POA: Diagnosis present

## 2015-07-16 DIAGNOSIS — E785 Hyperlipidemia, unspecified: Secondary | ICD-10-CM | POA: Diagnosis present

## 2015-07-16 DIAGNOSIS — N183 Chronic kidney disease, stage 3 (moderate): Secondary | ICD-10-CM | POA: Diagnosis present

## 2015-07-16 DIAGNOSIS — R251 Tremor, unspecified: Secondary | ICD-10-CM | POA: Diagnosis present

## 2015-07-16 DIAGNOSIS — Z7902 Long term (current) use of antithrombotics/antiplatelets: Secondary | ICD-10-CM

## 2015-07-16 LAB — CBC
HCT: 37.3 % (ref 35.0–47.0)
HEMOGLOBIN: 12.4 g/dL (ref 12.0–16.0)
MCH: 27.4 pg (ref 26.0–34.0)
MCHC: 33.2 g/dL (ref 32.0–36.0)
MCV: 82.4 fL (ref 80.0–100.0)
Platelets: 134 10*3/uL — ABNORMAL LOW (ref 150–440)
RBC: 4.52 MIL/uL (ref 3.80–5.20)
RDW: 14.9 % — ABNORMAL HIGH (ref 11.5–14.5)
WBC: 6.1 10*3/uL (ref 3.6–11.0)

## 2015-07-16 LAB — COMPREHENSIVE METABOLIC PANEL
ALT: 11 U/L — ABNORMAL LOW (ref 14–54)
ANION GAP: 8 (ref 5–15)
AST: 19 U/L (ref 15–41)
Albumin: 4 g/dL (ref 3.5–5.0)
Alkaline Phosphatase: 66 U/L (ref 38–126)
BILIRUBIN TOTAL: 1.2 mg/dL (ref 0.3–1.2)
BUN: 19 mg/dL (ref 6–20)
CHLORIDE: 104 mmol/L (ref 101–111)
CO2: 28 mmol/L (ref 22–32)
Calcium: 9.2 mg/dL (ref 8.9–10.3)
Creatinine, Ser: 1.26 mg/dL — ABNORMAL HIGH (ref 0.44–1.00)
GFR, EST AFRICAN AMERICAN: 44 mL/min — AB (ref >60)
GFR, EST NON AFRICAN AMERICAN: 38 mL/min — AB (ref >60)
Glucose, Bld: 123 mg/dL — ABNORMAL HIGH (ref 65–99)
POTASSIUM: 2.9 mmol/L — AB (ref 3.5–5.1)
Sodium: 140 mmol/L (ref 135–145)
TOTAL PROTEIN: 6.9 g/dL (ref 6.5–8.1)

## 2015-07-16 LAB — ECHOCARDIOGRAM COMPLETE
Height: 60 in
WEIGHTICAEL: 2288 [oz_av]

## 2015-07-16 LAB — MRSA PCR SCREENING: MRSA by PCR: NEGATIVE

## 2015-07-16 LAB — MAGNESIUM: MAGNESIUM: 1.5 mg/dL — AB (ref 1.7–2.4)

## 2015-07-16 LAB — URINALYSIS COMPLETE WITH MICROSCOPIC (ARMC ONLY)
BACTERIA UA: NONE SEEN
Bilirubin Urine: NEGATIVE
Glucose, UA: NEGATIVE mg/dL
Hgb urine dipstick: NEGATIVE
KETONES UR: NEGATIVE mg/dL
Leukocytes, UA: NEGATIVE
NITRITE: NEGATIVE
PH: 6 (ref 5.0–8.0)
SPECIFIC GRAVITY, URINE: 1.013 (ref 1.005–1.030)

## 2015-07-16 LAB — TROPONIN I
TROPONIN I: 0.06 ng/mL — AB (ref <0.031)
TROPONIN I: 0.08 ng/mL — AB (ref <0.031)
TROPONIN I: 0.12 ng/mL — AB (ref <0.031)

## 2015-07-16 MED ORDER — FERROUS SULFATE 325 (65 FE) MG PO TABS
325.0000 mg | ORAL_TABLET | Freq: Every day | ORAL | Status: DC
  Administered 2015-07-17 – 2015-07-20 (×4): 325 mg via ORAL
  Filled 2015-07-16 (×4): qty 1

## 2015-07-16 MED ORDER — LOSARTAN POTASSIUM 25 MG PO TABS
100.0000 mg | ORAL_TABLET | ORAL | Status: DC
  Administered 2015-07-17 – 2015-07-20 (×4): 100 mg via ORAL
  Filled 2015-07-16 (×4): qty 4

## 2015-07-16 MED ORDER — CALCIUM CARBONATE ANTACID 500 MG PO CHEW: CHEWABLE_TABLET | ORAL | Status: DC | PRN

## 2015-07-16 MED ORDER — DIAZEPAM 5 MG PO TABS
5.0000 mg | ORAL_TABLET | Freq: Every day | ORAL | Status: DC
  Administered 2015-07-16 – 2015-07-17 (×2): 5 mg via ORAL
  Filled 2015-07-16 (×2): qty 1

## 2015-07-16 MED ORDER — POTASSIUM CHLORIDE CRYS ER 20 MEQ PO TBCR
40.0000 meq | EXTENDED_RELEASE_TABLET | Freq: Once | ORAL | Status: AC
  Administered 2015-07-16: 40 meq via ORAL

## 2015-07-16 MED ORDER — MAGNESIUM SULFATE 2 GM/50ML IV SOLN
2.0000 g | Freq: Once | INTRAVENOUS | Status: AC
  Administered 2015-07-16: 2 g via INTRAVENOUS
  Filled 2015-07-16: qty 50

## 2015-07-16 MED ORDER — SODIUM CHLORIDE 0.9% FLUSH
3.0000 mL | Freq: Two times a day (BID) | INTRAVENOUS | Status: DC
  Administered 2015-07-16 – 2015-07-19 (×6): 3 mL via INTRAVENOUS

## 2015-07-16 MED ORDER — SODIUM CHLORIDE 0.9 % IV SOLN
1000.0000 mL | Freq: Once | INTRAVENOUS | Status: AC
  Administered 2015-07-16: 1000 mL via INTRAVENOUS

## 2015-07-16 MED ORDER — ACETAMINOPHEN 500 MG PO TABS: 500.0000 mg | ORAL_TABLET | Freq: Four times a day (QID) | ORAL | Status: DC | PRN

## 2015-07-16 MED ORDER — POTASSIUM CHLORIDE 10 MEQ/100ML IV SOLN
10.0000 meq | INTRAVENOUS | Status: AC
  Administered 2015-07-16: 19:00:00 10 meq via INTRAVENOUS
  Filled 2015-07-16 (×2): qty 100

## 2015-07-16 MED ORDER — SERTRALINE HCL 100 MG PO TABS
100.0000 mg | ORAL_TABLET | ORAL | Status: DC
  Administered 2015-07-17 – 2015-07-20 (×4): 100 mg via ORAL
  Filled 2015-07-16 (×4): qty 1

## 2015-07-16 MED ORDER — POTASSIUM CHLORIDE 10 MEQ/100ML IV SOLN
10.0000 meq | INTRAVENOUS | Status: AC
  Administered 2015-07-16 (×2): 10 meq via INTRAVENOUS
  Filled 2015-07-16 (×3): qty 100

## 2015-07-16 MED ORDER — VITAMIN D 1000 UNITS PO TABS
1000.0000 [IU] | ORAL_TABLET | Freq: Every day | ORAL | Status: DC
  Administered 2015-07-17 – 2015-07-20 (×4): 1000 [IU] via ORAL
  Filled 2015-07-16 (×5): qty 1

## 2015-07-16 MED ORDER — PRAVASTATIN SODIUM 40 MG PO TABS
20.0000 mg | ORAL_TABLET | Freq: Every evening | ORAL | Status: DC
  Administered 2015-07-16 – 2015-07-18 (×3): 20 mg via ORAL
  Filled 2015-07-16 (×4): qty 1

## 2015-07-16 MED ORDER — OXYBUTYNIN CHLORIDE 5 MG PO TABS
5.0000 mg | ORAL_TABLET | Freq: Every day | ORAL | Status: DC
  Administered 2015-07-16 – 2015-07-19 (×4): 5 mg via ORAL
  Filled 2015-07-16 (×4): qty 1

## 2015-07-16 MED ORDER — HYDRALAZINE HCL 25 MG PO TABS
50.0000 mg | ORAL_TABLET | Freq: Two times a day (BID) | ORAL | Status: DC
  Administered 2015-07-16 – 2015-07-18 (×5): 50 mg via ORAL
  Filled 2015-07-16 (×3): qty 2
  Filled 2015-07-16: qty 1
  Filled 2015-07-16: qty 2
  Filled 2015-07-16: qty 1

## 2015-07-16 MED ORDER — POTASSIUM CHLORIDE CRYS ER 20 MEQ PO TBCR
EXTENDED_RELEASE_TABLET | ORAL | Status: AC
  Filled 2015-07-16: qty 2

## 2015-07-16 MED ORDER — POTASSIUM CHLORIDE CRYS ER 20 MEQ PO TBCR
20.0000 meq | EXTENDED_RELEASE_TABLET | Freq: Once | ORAL | Status: AC
  Administered 2015-07-16: 20 meq via ORAL
  Filled 2015-07-16: qty 1

## 2015-07-16 MED ORDER — CLOPIDOGREL BISULFATE 75 MG PO TABS
75.0000 mg | ORAL_TABLET | ORAL | Status: DC
  Administered 2015-07-17 – 2015-07-20 (×4): 75 mg via ORAL
  Filled 2015-07-16 (×4): qty 1

## 2015-07-16 MED ORDER — PANTOPRAZOLE SODIUM 40 MG PO TBEC
40.0000 mg | DELAYED_RELEASE_TABLET | Freq: Every day | ORAL | Status: DC
  Administered 2015-07-17 – 2015-07-20 (×4): 40 mg via ORAL
  Filled 2015-07-16 (×4): qty 1

## 2015-07-16 MED ORDER — ENOXAPARIN SODIUM 30 MG/0.3ML ~~LOC~~ SOLN
30.0000 mg | SUBCUTANEOUS | Status: DC
  Administered 2015-07-16 – 2015-07-17 (×2): 30 mg via SUBCUTANEOUS
  Filled 2015-07-16 (×2): qty 0.3

## 2015-07-16 MED ORDER — ACETAMINOPHEN 325 MG PO TABS
650.0000 mg | ORAL_TABLET | Freq: Four times a day (QID) | ORAL | Status: DC | PRN
  Administered 2015-07-17: 650 mg via ORAL
  Filled 2015-07-16: qty 2

## 2015-07-16 MED ORDER — RISAQUAD PO CAPS
ORAL_CAPSULE | Freq: Every day | ORAL | Status: DC
  Administered 2015-07-17 – 2015-07-20 (×4): 1 via ORAL
  Filled 2015-07-16 (×4): qty 1

## 2015-07-16 MED ORDER — ACETAMINOPHEN 650 MG RE SUPP: 650.0000 mg | Freq: Four times a day (QID) | RECTAL | Status: DC | PRN

## 2015-07-16 MED ORDER — PROPRANOLOL HCL 40 MG PO TABS
40.0000 mg | ORAL_TABLET | Freq: Two times a day (BID) | ORAL | Status: DC
  Administered 2015-07-16: 22:00:00 40 mg via ORAL
  Filled 2015-07-16 (×2): qty 1

## 2015-07-16 MED ORDER — ONDANSETRON HCL 4 MG PO TABS: 4.0000 mg | ORAL_TABLET | Freq: Three times a day (TID) | ORAL | Status: DC | PRN

## 2015-07-16 NOTE — H&P (Signed)
[facility] [place] [facility]   PATIENT NAME: Martha Hunter    MR#:  [id number]  DATE OF BIRTH:  Aug 10, 1928  DATE OF ADMISSION:  07/16/2015  PRIMARY CARE PHYSICIAN: BABAOFF, Lavada Mesi, MD   REQUESTING/REFERRING PHYSICIAN: Dr. Jene Every  CHIEF COMPLAINT:   Chief Complaint  Patient presents with  . Weakness    HISTORY OF PRESENT ILLNESS:  Martha Hunter  is a 82 y.o. female patient brought in to the hospital because she couldn't stand. This morning she got up in the nursing staff was trying to get her out of bed. She couldn't stand up. No strength in her legs. Normally she is able to walk with physical therapy. Her blood pressure in the ER has been up and she got her morning medications. She's noticed her feet swelling. Patient is a poor historian and very tangential with her answers.  PAST MEDICAL HISTORY:   Past Medical History  Diagnosis Date  . Hypertension   . Hypercholesteremia   . Depression   . Staph infection   . CAP (community acquired pneumonia)     a. 07/2014.  Marland Kitchen History of TIA (transient ischemic attack)     a. on plavix chronically.  . Cystocele   . Rectocele   . Bladder spasm   . Hyperlipidemia   . Vaginal atrophy   . Bladder instability   . UTI (lower urinary tract infection)     PAST SURGICAL HISTORY:   Past Surgical History  Procedure Laterality Date  . None    . Dilation and curettage of uterus    . Eye surgery      SOCIAL HISTORY:   Social History  Substance Use Topics  . Smoking status: Former Games developer  . Smokeless tobacco: Not on file  . Alcohol Use: No    FAMILY HISTORY:   Family History  Problem Relation Age of Onset  . CAD      parents and 9 siblings all died with CAD.  Marland Kitchen Heart disease Sister   . Heart disease Brother   . Cancer Neg Hx   . Diabetes Neg Hx   . Congestive Heart Failure Mother   . CAD Father     DRUG ALLERGIES:   Allergies  Allergen Reactions  . Niacin And Related   .  Nitrates, Organic Other (See Comments)    Reaction: unknown  . Penicillins Other (See Comments)    Reaction: unknown  . Sulfa Antibiotics Other (See Comments)    Reaction: unknown     REVIEW OF SYSTEMS:  CONSTITUTIONAL: No fever, Positive for weakness. Positive for weight loss. EYES: No blurred or double vision. Wears glasses EARS, NOSE, AND THROAT: No tinnitus or ear pain. No sore throat. Some congestion in the a.m. RESPIRATORY: Some cough, no shortness of breath, no wheezing or hemoptysis.  CARDIOVASCULAR: No chest pain, some edema.  GASTROINTESTINAL: No nausea, vomiting, diarrhea or abdominal pain. No blood in bowel movements GENITOURINARY: No dysuria, hematuria.  ENDOCRINE: No polyuria, nocturia,  HEMATOLOGY: No anemia, easy bruising or bleeding SKIN: No rash or lesion. MUSCULOSKELETAL: No joint pain or arthritis.   NEUROLOGIC: No tingling, numbness, weakness.  PSYCHIATRY: No anxiety or depression.   MEDICATIONS AT HOME:   Prior to Admission medications   Medication Sig Start Date End Date Taking? Authorizing Provider  acetaminophen (TYLENOL) 500 MG tablet Take 500-1,000 mg by mouth every 6 (six) hours as needed for mild pain.   Yes Historical Provider, MD  calcium carbonate (TUMS EX)  750 MG chewable tablet Chew 1 tablet by mouth every 4 (four) hours as needed for heartburn.   Yes Historical Provider, MD  Cholecalciferol (VITAMIN D-3) 1000 UNITS CAPS Take 1 capsule by mouth daily.    Yes Historical Provider, MD  clopidogrel (PLAVIX) 75 MG tablet Take 75 mg by mouth every morning.    Yes Historical Provider, MD  diazepam (VALIUM) 5 MG tablet Take 5 mg by mouth at bedtime.    Yes Historical Provider, MD  estradiol (ESTRACE VAGINAL) 0.1 MG/GM vaginal cream Place 1 Applicatorful vaginally 2 (two) times a week. 1/2 gram twice weekly. 10/14/14  Yes Prentice Docker Defrancesco, MD  ferrous sulfate 325 (65 FE) MG tablet Take 325 mg by mouth daily.    Yes Historical Provider, MD  hydrALAZINE  (APRESOLINE) 50 MG tablet Take 50 mg by mouth 2 (two) times daily.    Yes Historical Provider, MD  loperamide (IMODIUM) 2 MG capsule Take 2 mg by mouth 3 (three) times daily as needed for diarrhea or loose stools.   Yes Historical Provider, MD  losartan (COZAAR) 100 MG tablet Take 100 mg by mouth every morning.    Yes Historical Provider, MD  omeprazole (PRILOSEC) 20 MG capsule Take 20 mg by mouth daily.   Yes Historical Provider, MD  ondansetron (ZOFRAN) 4 MG tablet Take 4 mg by mouth every 8 (eight) hours as needed for nausea or vomiting.   Yes Historical Provider, MD  oxybutynin (DITROPAN) 5 MG tablet Take 5 mg by mouth at bedtime.    Yes Historical Provider, MD  pravastatin (PRAVACHOL) 20 MG tablet Take 20 mg by mouth every evening.  11/13/14  Yes Historical Provider, MD  Probiotic Product (PROBIOTIC-10 ULTIMATE PO) Take 1 capsule by mouth daily.   Yes Historical Provider, MD  propranolol (INDERAL) 40 MG tablet Take 40 mg by mouth 2 (two) times daily.   Yes Historical Provider, MD  sertraline (ZOLOFT) 100 MG tablet Take 100 mg by mouth every morning.    Yes Historical Provider, MD      VITAL SIGNS:  Blood pressure 148/59, pulse 57, temperature 97.8 F (36.6 C), temperature source Oral, resp. rate 27, height 5' (1.524 m), weight 64.864 kg (143 lb), SpO2 94 %.  PHYSICAL EXAMINATION:  GENERAL:  82 y.o.-year-old patient lying in the bed with no acute distress.  EYES: Pupils equal, round, reactive to light and accommodation. No scleral icterus. Extraocular muscles intact.  HEENT: Head atraumatic, normocephalic. Oropharynx and nasopharynx clear.  NECK:  Supple, no jugular venous distention. No thyroid enlargement, no tenderness.  LUNGS: Decreased breath sounds bilaterally, no wheezing, rales,rhonchi or crepitation. No use of accessory muscles of respiration.  CARDIOVASCULAR: S1, S2 bradycardic. No murmurs, rubs, or gallops.  ABDOMEN: Soft, nontender, nondistended. Bowel sounds present. No  organomegaly or mass.  EXTREMITIES: Trace edema, no cyanosis, or clubbing.  NEUROLOGIC: Cranial nerves II through XII are intact. Muscle strength 5/5 in all extremities. Sensation intact. Gait not checked. Patient unable to straight leg raise bilaterally. Babinski negative PSYCHIATRIC: The patient is alert. Very tangential with her answers SKIN: No rash, lesion, or ulcer.   LABORATORY PANEL:   CBC  Recent Labs Lab 07/16/15 1100  WBC 6.1  HGB 12.4  HCT 37.3  PLT 134*   ------------------------------------------------------------------------------------------------------------------  Chemistries   Recent Labs Lab 07/16/15 1100  NA 140  K 2.9*  CL 104  CO2 28  GLUCOSE 123*  BUN 19  CREATININE 1.26*  CALCIUM 9.2  MG 1.5*  AST 19  ALT 11*  ALKPHOS 66  BILITOT 1.2   ------------------------------------------------------------------------------------------------------------------  Cardiac Enzymes  Recent Labs Lab 07/16/15 1307  TROPONINI 0.08*   ------------------------------------------------------------------------------------------------------------------  RADIOLOGY:  Ct Head Wo Contrast  07/16/2015  CLINICAL DATA:  Confusion/altered mental status for 1 day EXAM: CT HEAD WITHOUT CONTRAST TECHNIQUE: Contiguous axial images were obtained from the base of the skull through the vertex without intravenous contrast. COMPARISON:  Head CT May 27, 2015 and brain MR May 29, 2015 FINDINGS: Moderate diffuse atrophy is stable. There is no intracranial mass, hemorrhage, extra-axial fluid collection, or midline shift. There is patchy small vessel disease throughout the centra semiovale bilaterally. There is evidence of a prior lacunar infarct involving the superior anterior left lentiform nucleus extending into the inferior anterior left centrum semiovale. There is a prior lacunar infarct in the anterior right centrum semiovale. There is evidence of a prior infarct in the  periphery of the left cerebellar hemisphere, stable. There is no demonstrable new gray-white compartment lesion. No acute infarct evident. The bony calvarium appears intact. The mastoid air cells are clear. Orbits appear symmetric bilaterally. Cavernous carotid artery calcification noted bilaterally. IMPRESSION: Stable atrophy with small vessel disease and prior infarcts as noted above. No acute infarct evident. No hemorrhage or mass effect. Electronically Signed   By: Bretta BangWilliam  Woodruff III M.D.   On: 07/16/2015 12:45   Dg Chest Portable 1 View  07/16/2015  CLINICAL DATA:  Weakness. EXAM: PORTABLE CHEST 1 VIEW COMPARISON:  May 27, 2015. FINDINGS: Stable cardiomediastinal silhouette. No pneumothorax or pleural effusion is noted. Both lungs are clear. The visualized skeletal structures are unremarkable. IMPRESSION: No acute cardiopulmonary abnormality seen. Electronically Signed   By: Lupita RaiderJames  Green Jr, M.D.   On: 07/16/2015 12:09    EKG:   Sinus rhythm 52 bpm, LVH, IVCD  IMPRESSION AND PLAN:   1. Severe hypokalemia and hypomagnesemia. I added on a magnesium which was also low. I will replace magnesium IV potassium IV and orally and see how her strength is after that. Patient does not take any medications that would lower her potassium. 2. Weakness. Could be secondary to low potassium. Physical therapy evaluation. 3. Could be a possible bronchitis I will give respiratory treatments try to hold off on antibiotics chest x-ray negative. 4. Essential hypertension blood pressure very variable here with her tremor. I will continue her usual medications and monitor 5. Elevated troponins only borderline no complaints of chest pain or shortness of breath we'll obtain an echocardiogram and another troponin. Monitor on telemetry. Aspirin. 6. Dehydration. Continue IV fluids. 7. Patient very slow with her answers. But this is usual for her as per the son. Hold off on further imaging of the brain.   All the  records are reviewed and case discussed with ED provider. Management plans discussed with the patient, family and they are in agreement.  CODE STATUS: Full code  TOTAL TIME TAKING CARE OF THIS PATIENT: 55 minutes.    Alford HighlandWIETING, Malvina Schadler M.D on 07/16/2015 at 3:33 PM  Between 7am to 6pm - Pager - [phone]  After 6pm call admission pager [phone]  [facility] Office  [phone]  CC: Primary care physician; BABAOFF, Lavada MesiMARC E, MD

## 2015-07-16 NOTE — ED Notes (Signed)
Pt clean upon transfer to floor, all of clothing and shoes sent with patient. Pt alert and oriented X4, active, cooperative, pt in NAD. RR even and unlabored, color WNL.

## 2015-07-16 NOTE — ED Notes (Signed)
Troponin verbal readback with Dr Cyril LoosenKinner, verbally acknowledged. No further orders at this time.

## 2015-07-16 NOTE — ED Notes (Signed)
[facility]; staff reports pt with increased weakness since yesterday. Pt alert in room, reports indigestion. EMS reports pt vomited x1.

## 2015-07-16 NOTE — ED Notes (Signed)
MD at bedside. 

## 2015-07-16 NOTE — ED Notes (Signed)
Pt pericare performed. Family at bedside. States pt at baseline mentality. Hx of "ministroke" in March.

## 2015-07-16 NOTE — ED Notes (Signed)
Pt back from CT

## 2015-07-16 NOTE — ED Provider Notes (Signed)
[facility] Emergency Department Provider Note  ____________________________________________    I have reviewed the triage vital signs and the nursing notes.   HISTORY  Chief Complaint Weakness History limited by patient confusion HPI Martha Hunter is a 82 y.o. female [facility] weakness diffusely. Patient apparently did not want to get out of bed this morning and staff was concerned so they sent her to the emergency department for evaluation. EMS reports the patient gagged on the way over but she denies abdominal pain or nausea currently. Son feels the patient is at her baseline. No fevers reported.     Past Medical History  Diagnosis Date  . Hypertension   . Hypercholesteremia   . Depression   . Staph infection   . CAP (community acquired pneumonia)     a. 07/2014.  Marland Kitchen. History of TIA (transient ischemic attack)     a. on plavix chronically.  . Cystocele   . Rectocele   . Bladder spasm   . Hyperlipidemia   . Vaginal atrophy   . Bladder instability   . UTI (lower urinary tract infection)     Patient Active Problem List   Diagnosis Date Noted  . Metabolic encephalopathy 05/29/2015  . Altered mental status 05/27/2015  . Anxiety 10/14/2014  . Clinical depression 10/14/2014  . Dizziness 10/14/2014  . HLD (hyperlipidemia) 10/14/2014  . BP (high blood pressure) 10/14/2014  . Adiposity 10/14/2014  . Unstable bladder 10/14/2014  . Cystocele 10/14/2014  . Vaginal atrophy 10/14/2014  . CAP (community acquired pneumonia) 07/23/2014  . Chronic kidney disease (CKD), stage III (moderate) 04/07/2014    Past Surgical History  Procedure Laterality Date  . None    . Dilation and curettage of uterus      Current Outpatient Rx  Name  Route  Sig  Dispense  Refill  . acetaminophen (TYLENOL) 500 MG tablet   Oral   Take 500-1,000 mg by mouth every 6 (six) hours as needed for mild pain.         Marland Kitchen. alum & mag  hydroxide-simeth (MAALOX/MYLANTA) 200-200-20 MG/5ML suspension   Oral   Take 30 mLs by mouth as needed for indigestion or heartburn.         Marland Kitchen. amLODipine (NORVASC) 5 MG tablet   Oral   Take 5 mg by mouth at bedtime.          . calcium carbonate (TUMS EX) 750 MG chewable tablet   Oral   Chew 1 tablet by mouth every 4 (four) hours as needed for heartburn.         . Cholecalciferol (VITAMIN D-3) 1000 UNITS CAPS   Oral   Take 1 capsule by mouth daily.          . clopidogrel (PLAVIX) 75 MG tablet   Oral   Take 75 mg by mouth every morning.          . diazepam (VALIUM) 5 MG tablet   Oral   Take 5 mg by mouth at bedtime.          Marland Kitchen. estradiol (ESTRACE VAGINAL) 0.1 MG/GM vaginal cream   Vaginal   Place 1 Applicatorful vaginally 2 (two) times a week. 1/2 gram twice weekly.   42.5 g   12   . ferrous sulfate 325 (65 FE) MG tablet   Oral   Take 325 mg by mouth daily at 12 noon.          . hydrALAZINE (APRESOLINE)  50 MG tablet   Oral   Take 50 mg by mouth 2 (two) times daily.          Marland Kitchen loperamide (IMODIUM) 2 MG capsule   Oral   Take 2 mg by mouth 3 (three) times daily as needed for diarrhea or loose stools.         Marland Kitchen losartan (COZAAR) 100 MG tablet   Oral   Take 100 mg by mouth every morning.          Marland Kitchen omeprazole (PRILOSEC) 20 MG capsule   Oral   Take 20 mg by mouth daily.         . ondansetron (ZOFRAN) 4 MG tablet   Oral   Take 4 mg by mouth every 8 (eight) hours as needed for nausea or vomiting.         Marland Kitchen oxybutynin (DITROPAN) 5 MG tablet   Oral   Take 5 mg by mouth 3 (three) times daily.         . pravastatin (PRAVACHOL) 20 MG tablet   Oral   Take 20 mg by mouth every evening.          . Probiotic Product (PROBIOTIC DAILY PO)   Oral   Take 1 tablet by mouth every morning.         . sertraline (ZOLOFT) 100 MG tablet   Oral   Take 100 mg by mouth every morning.          . Zinc Oxide (DESITIN) 13 % CREA   Apply externally    Apply topically. MAY USE TO THE GROIN AND VULVA FOUR TIMES DAILY AS NEEDED TO PROTECT SKIN FROM URINE AND DEPENDS PROTECTIVE WEAR.           Allergies Niacin and related; Nitrates, organic; Penicillins; and Sulfa antibiotics  Family History  Problem Relation Age of Onset  . CAD      parents and 9 siblings all died with CAD.  Marland Kitchen Heart disease Sister   . Heart disease Brother   . Cancer Neg Hx   . Diabetes Neg Hx     Social History Social History  Substance Use Topics  . Smoking status: Former Games developer  . Smokeless tobacco: None  . Alcohol Use: No    Review of SystemsLimited by patient's confusion  Constitutional: Denies fevers Eyes: Denies visual change ENT: Negative for sore throat Cardiovascular: Denies chest pain Respiratory: Denies shortness of breath. Gastrointestinal: Negative for abdominal pain Genitourinary: Negative for dysuria. Musculoskeletal: Negative for back pain. Skin: Negative for injury Neurological: Negative for focal weakness, no headache     ____________________________________________   PHYSICAL EXAM:  VITAL SIGNS: ED Triage Vitals  Enc Vitals Group     BP 07/16/15 1109 220/71 mmHg     Pulse Rate 07/16/15 1109 49     Resp 07/16/15 1109 16     Temp 07/16/15 1109 97.8 F (36.6 C)     Temp Source 07/16/15 1109 Oral     SpO2 07/16/15 1109 98 %     Weight 07/16/15 1109 143 lb (64.864 kg)     Height 07/16/15 1109 5' (1.524 m)     Head Cir --      Peak Flow --      Pain Score --      Pain Loc --      Pain Edu? --      Excl. in GC? --     Constitutional: Alert.Well appearing and in no distress.  Eyes:  Conjunctivae are normal. No erythema or injection ENT   Head: Normocephalic and atraumatic.   Mouth/Throat: Mucous membranes are moist. Cardiovascular: Normal rate, regular rhythm. Normal and symmetric distal pulses are present in the upper extremities.  Respiratory: Normal respiratory effort without tachypnea nor retractions.  Breath sounds are clear and equal bilaterally.  Gastrointestinal: Soft and non-tender in all quadrants. No distention. There is no CVA tenderness. Genitourinary: deferred Musculoskeletal: Nontender with normal range of motion in all extremities. No lower extremity tenderness. 1+ edema bilaterally Neurologic:  Normal speech and language. No gross focal neurologic deficits are appreciated. Skin:  Skin is warm, dry and intact. No rash noted. Psychiatric: Mood and affect are normal. Patient exhibits appropriate insight and judgment.  ____________________________________________    LABS (pertinent positives/negatives)  Labs Reviewed  CBC - Abnormal; Notable for the following:    RDW 14.9 (*)    Platelets 134 (*)    All other components within normal limits  URINALYSIS COMPLETEWITH MICROSCOPIC ([facility] ONLY) - Abnormal; Notable for the following:    Color, Urine YELLOW (*)    APPearance HAZY (*)    Protein, ur >500 (*)    Squamous Epithelial / LPF 0-5 (*)    All other components within normal limits  TROPONIN I - Abnormal; Notable for the following:    Troponin I 0.06 (*)    All other components within normal limits  COMPREHENSIVE METABOLIC PANEL - Abnormal; Notable for the following:    Potassium 2.9 (*)    Glucose, Bld 123 (*)    Creatinine, Ser 1.26 (*)    ALT 11 (*)    GFR calc non Af Amer 38 (*)    GFR calc Af Amer 44 (*)    All other components within normal limits  TROPONIN I    ____________________________________________   EKG  ED ECG REPORT I, Jene Every, the attending physician, personally viewed and interpreted this ECG.  Date: 07/16/2015 EKG Time: 10:53 AM Rate: 52 Rhythm: normal sinus rhythm QRS Axis: normal Intervals: normal ST/T Wave abnormalities: normal Conduction Disturbances: none Narrative Interpretation: unremarkable   ____________________________________________    RADIOLOGY  CT head and chest x-ray  unremarkable  ____________________________________________   PROCEDURES  Procedure(s) performed: none  Critical Care performed: none  ____________________________________________   INITIAL IMPRESSION / ASSESSMENT AND PLAN / ED COURSE  Pertinent labs & imaging results that were available during my care of the patient were reviewed by me and considered in my medical decision making (see chart for details).  Patient overall well-appearing with benign exam. We will check labs, CT head, chest x-ray, urine and reevaluate.  Workup is overall reassuring, patient has a mild elevation in her troponin but denies any chest pain. We will recheck in 2 hours to ensure no worsening.  ----------------------------------------- 2:44 PM on 07/16/2015 -----------------------------------------  Patient with increased troponin, discussed with son who agrees with observation in the hospital.  ____________________________________________   FINAL CLINICAL IMPRESSION(S) / ED DIAGNOSES  Final diagnoses:  Weakness  Elevated troponin          Jene Every, MD 07/16/15 1444

## 2015-07-17 DIAGNOSIS — N183 Chronic kidney disease, stage 3 (moderate): Secondary | ICD-10-CM | POA: Diagnosis present

## 2015-07-17 DIAGNOSIS — R251 Tremor, unspecified: Secondary | ICD-10-CM | POA: Diagnosis present

## 2015-07-17 DIAGNOSIS — Z79899 Other long term (current) drug therapy: Secondary | ICD-10-CM | POA: Diagnosis not present

## 2015-07-17 DIAGNOSIS — N179 Acute kidney failure, unspecified: Secondary | ICD-10-CM | POA: Diagnosis present

## 2015-07-17 DIAGNOSIS — I129 Hypertensive chronic kidney disease with stage 1 through stage 4 chronic kidney disease, or unspecified chronic kidney disease: Secondary | ICD-10-CM | POA: Diagnosis present

## 2015-07-17 DIAGNOSIS — E876 Hypokalemia: Secondary | ICD-10-CM | POA: Diagnosis present

## 2015-07-17 DIAGNOSIS — R748 Abnormal levels of other serum enzymes: Secondary | ICD-10-CM | POA: Diagnosis present

## 2015-07-17 DIAGNOSIS — E669 Obesity, unspecified: Secondary | ICD-10-CM | POA: Diagnosis present

## 2015-07-17 DIAGNOSIS — R531 Weakness: Secondary | ICD-10-CM | POA: Diagnosis present

## 2015-07-17 DIAGNOSIS — Z880 Allergy status to penicillin: Secondary | ICD-10-CM | POA: Diagnosis not present

## 2015-07-17 DIAGNOSIS — J209 Acute bronchitis, unspecified: Secondary | ICD-10-CM | POA: Diagnosis present

## 2015-07-17 DIAGNOSIS — Z882 Allergy status to sulfonamides status: Secondary | ICD-10-CM | POA: Diagnosis not present

## 2015-07-17 DIAGNOSIS — Z888 Allergy status to other drugs, medicaments and biological substances status: Secondary | ICD-10-CM | POA: Diagnosis not present

## 2015-07-17 DIAGNOSIS — R001 Bradycardia, unspecified: Secondary | ICD-10-CM | POA: Diagnosis present

## 2015-07-17 DIAGNOSIS — E785 Hyperlipidemia, unspecified: Secondary | ICD-10-CM | POA: Diagnosis present

## 2015-07-17 DIAGNOSIS — E860 Dehydration: Secondary | ICD-10-CM | POA: Diagnosis present

## 2015-07-17 DIAGNOSIS — Z87891 Personal history of nicotine dependence: Secondary | ICD-10-CM | POA: Diagnosis not present

## 2015-07-17 DIAGNOSIS — Z8249 Family history of ischemic heart disease and other diseases of the circulatory system: Secondary | ICD-10-CM | POA: Diagnosis not present

## 2015-07-17 DIAGNOSIS — Z7902 Long term (current) use of antithrombotics/antiplatelets: Secondary | ICD-10-CM | POA: Diagnosis not present

## 2015-07-17 LAB — LIPID PANEL
CHOL/HDL RATIO: 5.8 ratio
CHOLESTEROL: 181 mg/dL (ref 0–200)
HDL: 31 mg/dL — AB (ref >40)
LDL Cholesterol: 122 mg/dL — ABNORMAL HIGH (ref 0–99)
TRIGLYCERIDES: 141 mg/dL (ref <150)
VLDL: 28 mg/dL (ref 0–40)

## 2015-07-17 LAB — BASIC METABOLIC PANEL
Anion gap: 9 (ref 5–15)
BUN: 17 mg/dL (ref 6–20)
CHLORIDE: 105 mmol/L (ref 101–111)
CO2: 25 mmol/L (ref 22–32)
CREATININE: 1.21 mg/dL — AB (ref 0.44–1.00)
Calcium: 8.3 mg/dL — ABNORMAL LOW (ref 8.9–10.3)
GFR calc non Af Amer: 40 mL/min — ABNORMAL LOW (ref >60)
GFR, EST AFRICAN AMERICAN: 46 mL/min — AB (ref >60)
Glucose, Bld: 98 mg/dL (ref 65–99)
Potassium: 3.4 mmol/L — ABNORMAL LOW (ref 3.5–5.1)
Sodium: 139 mmol/L (ref 135–145)

## 2015-07-17 LAB — MAGNESIUM: Magnesium: 2 mg/dL (ref 1.7–2.4)

## 2015-07-17 MED ORDER — AMLODIPINE BESYLATE 10 MG PO TABS
10.0000 mg | ORAL_TABLET | Freq: Every day | ORAL | Status: DC
  Administered 2015-07-17 – 2015-07-20 (×4): 10 mg via ORAL
  Filled 2015-07-17 (×4): qty 1

## 2015-07-17 MED ORDER — SODIUM CHLORIDE 0.9 % IV SOLN: INTRAVENOUS | Status: DC

## 2015-07-17 MED ORDER — POTASSIUM CHLORIDE IN NACL 20-0.9 MEQ/L-% IV SOLN
INTRAVENOUS | Status: DC
  Administered 2015-07-17 – 2015-07-20 (×6): via INTRAVENOUS
  Filled 2015-07-17 (×9): qty 1000

## 2015-07-17 NOTE — Clinical Social Work Placement (Signed)
   CLINICAL SOCIAL WORK PLACEMENT  NOTE  Date:  07/17/2015  Patient Details  Name: Martha DialsRuth Mies MRN: [id number] Date of Birth: 05/28/1928  Clinical Social Work is seeking post-discharge placement for this patient at the Skilled  Nursing Facility level of care (*CSW will initial, date and re-position this form in  chart as items are completed):  Yes   Patient/family provided with [facility] Clinical Social Work Department's list of facilities offering this level of care within the geographic area requested by the patient (or if unable, by the patient's family).  Yes   Patient/family informed of their freedom to choose among providers that offer the needed level of care, that participate in Medicare, Medicaid or managed care program needed by the patient, have an available bed and are willing to accept the patient.  Yes   Patient/family informed of 's ownership interest in [facility], as well as of the fact that they are under no obligation to receive care at these facilities.  PASRR submitted to EDS on       PASRR number received on       Existing PASRR number confirmed on 07/17/15     FL2 transmitted to all facilities in geographic area requested by pt/family on 07/17/15     FL2 transmitted to all facilities within larger geographic area on       Patient informed that his/her managed care company has contracts with or will negotiate with certain facilities, including the following:            Patient/family informed of bed offers received.  Patient chooses bed at       Physician recommends and patient chooses bed at      Patient to be transferred to   on  .  Patient to be transferred to facility by       Patient family notified on   of transfer.  Name of family member notified:        PHYSICIAN       Additional Comment:    _______________________________________________ Dede QuerySarah Kentrell Guettler, LCSW 07/17/2015, 4:26 PM

## 2015-07-17 NOTE — Care Management (Signed)
Admitted [facility] under observation status with the diagnosis of hypokalemia. Son is Martha RuizJohn [phone](Jay) [phone]). [facility]. Last seen Dr. Larwance Hunter Jun 26 2015. Home Health in the past. No skilled facility. No home oxygen. Good appetite. Last fall was a couple of months ago. Uses a rollayor walker to aid in ambulation. So is at the bedside. Martha GreetBrenda S Yarrow Linhart RN MSN CCM Care Management [phone]

## 2015-07-17 NOTE — Progress Notes (Signed)
Patient ID: Martha DialsRuth Hunter, female   DOB: Dec 18, 1928, 82 y.o.   MRN: [id number] [facility] [place] [facility]   PATIENT NAME: Martha Hunter    MR#:  [id number]  DATE OF BIRTH:  Dec 18, 1928  SUBJECTIVE:  Patient came in after she will felt very weak and [facility] she is normally independent. She required 2 people assist. She was unable to stand to walk . She was found to have severe hypokalemia and malignant hypertension. Patient's appetite is improved. She still appears clinically dehydrated. Reports feeling quite weak  REVIEW OF SYSTEMS:   Review of Systems  Constitutional: Positive for malaise/fatigue. Negative for fever, chills and diaphoresis.  HENT: Negative for congestion, ear pain, hearing loss, nosebleeds and sore throat.   Eyes: Negative for blurred vision, double vision, photophobia and pain.  Respiratory: Negative for hemoptysis, sputum production, wheezing and stridor.   Cardiovascular: Negative for orthopnea, claudication and leg swelling.  Gastrointestinal: Negative for heartburn and abdominal pain.  Genitourinary: Negative for dysuria and frequency.  Musculoskeletal: Positive for joint pain. Negative for back pain and neck pain.  Skin: Negative for rash.  Neurological: Positive for tremors and weakness. Negative for tingling, sensory change, speech change, focal weakness, seizures and headaches.  Endo/Heme/Allergies: Does not bruise/bleed easily.  Psychiatric/Behavioral: Negative for suicidal ideas, memory loss and substance abuse. The patient is not nervous/anxious.    Tolerating Diet:yes Tolerating PT: pending  DRUG ALLERGIES:   Allergies  Allergen Reactions  . Niacin And Related   . Nitrates, Organic Other (See Comments)    Reaction: unknown  . Penicillins Other (See Comments)    Reaction: unknown  . Sulfa Antibiotics Other (See Comments)    Reaction: unknown     VITALS:  Blood pressure 160/60, pulse 56, temperature 98.9 F  (37.2 C), temperature source Oral, resp. rate 20, height 5' (1.524 m), weight 64.864 kg (143 lb), SpO2 93 %.  PHYSICAL EXAMINATION:   Physical Exam  GENERAL:  82 y.o.-year-old patient lying in the bed with no acute distress. obese EYES: Pupils equal, round, reactive to light and accommodation. No scleral icterus. Extraocular muscles intact.  HEENT: Head atraumatic, normocephalic. Oropharynx dry NECK:  Supple, no jugular venous distention. No thyroid enlargement, no tenderness.  LUNGS: Normal breath sounds bilaterally, no wheezing, rales, rhonchi. No use of accessory muscles of respiration.  CARDIOVASCULAR: S1, S2 normal. No murmurs, rubs, or gallops.  ABDOMEN: Soft, nontender, nondistended. Bowel sounds present. No organomegaly or mass.  EXTREMITIES: No cyanosis, clubbing or edema b/l.    NEUROLOGIC: Cranial nerves II through XII are intact. No focal Motor or sensory deficits b/l. Appears weak  PSYCHIATRIC:  patient is alert and oriented x 3.  SKIN: No obvious rash, lesion, or ulcer.   LABORATORY PANEL:  CBC  Recent Labs Lab 07/16/15 1100  WBC 6.1  HGB 12.4  HCT 37.3  PLT 134*    Chemistries   Recent Labs Lab 07/16/15 1100 07/17/15 0500  NA 140 139  K 2.9* 3.4*  CL 104 105  CO2 28 25  GLUCOSE 123* 98  BUN 19 17  CREATININE 1.26* 1.21*  CALCIUM 9.2 8.3*  MG 1.5* 2.0  AST 19  --   ALT 11*  --   ALKPHOS 66  --   BILITOT 1.2  --    Cardiac Enzymes  Recent Labs Lab 07/16/15 2023  TROPONINI 0.12*   RADIOLOGY:  Ct Head Wo Contrast  07/16/2015  CLINICAL DATA:  Confusion/altered mental status for 1 day  EXAM: CT HEAD WITHOUT CONTRAST TECHNIQUE: Contiguous axial images were obtained from the base of the skull through the vertex without intravenous contrast. COMPARISON:  Head CT May 27, 2015 and brain MR May 29, 2015 FINDINGS: Moderate diffuse atrophy is stable. There is no intracranial mass, hemorrhage, extra-axial fluid collection, or midline shift. There is  patchy small vessel disease throughout the centra semiovale bilaterally. There is evidence of a prior lacunar infarct involving the superior anterior left lentiform nucleus extending into the inferior anterior left centrum semiovale. There is a prior lacunar infarct in the anterior right centrum semiovale. There is evidence of a prior infarct in the periphery of the left cerebellar hemisphere, stable. There is no demonstrable new gray-white compartment lesion. No acute infarct evident. The bony calvarium appears intact. The mastoid air cells are clear. Orbits appear symmetric bilaterally. Cavernous carotid artery calcification noted bilaterally. IMPRESSION: Stable atrophy with small vessel disease and prior infarcts as noted above. No acute infarct evident. No hemorrhage or mass effect. Electronically Signed   By: Bretta Bang III M.D.   On: 07/16/2015 12:45   Dg Chest Portable 1 View  07/16/2015  CLINICAL DATA:  Weakness. EXAM: PORTABLE CHEST 1 VIEW COMPARISON:  May 27, 2015. FINDINGS: Stable cardiomediastinal silhouette. No pneumothorax or pleural effusion is noted. Both lungs are clear. The visualized skeletal structures are unremarkable. IMPRESSION: No acute cardiopulmonary abnormality seen. Electronically Signed   By: Lupita Raider, M.D.   On: 07/16/2015 12:09   ASSESSMENT AND PLAN:   Martha Hunter is a 82 y.o. female patient brought in to the hospital because she couldn't stand. This morning she got up in the nursing staff was trying to get her out of bed. She couldn't stand up. No strength in her legs. Normally she is able to walk with physical therapy. Her blood pressure in the ER  1. Severe hypokalemia and hypomagnesemia/acute renal failure - improved now with IV replacement  -We'll continue IV fluids for dehydration  2. Weakness. Could be secondary to low potassium. Physical therapy evaluation pending. -Given overall decline in patient's condition she would benefit from  rehabilitation. Await PT recommendation  3. Could be a possible bronchitis - will give respiratory treatments try to hold off on antibiotics chest x-ray negative.  4. Malignant hypertension blood pressure very variable here with her tremor -We'll continue losartan, hydralazine, resume back amlodipine -Discontinue propranolol due to bradycardia. Patient's heart rate was anywhere from 44-53. Given her symptoms of new onset weakness and combination of dehydration/acute renal failure. -IV when necessary hydralazine for high blood pressure  5. Elevated troponins only borderline no complaints of chest pain or shortness of breath we'll obtain an echocardiogram and another troponin. Monitor on telemetry. Aspirin. -No acute EKG changes for ST elevation or depression -Troponins could be a limited because of low but her creatinine  6. Acute renal failure/Dehydration. Continue IV fluids. - encourage oral fluids  -Baseline creatinine 0.09 -Patient came in with a creatinine of 1.26--- 1.21  7. Hyperlipidemia continue statins  Case discussed with Care Management/Social Worker. Management plans discussed with the patient, family and they are in agreement.  CODE STATUS: Full  DVT Prophylaxis: Lovenox  TOTAL TIME TAKING CARE OF THIS PATIENT: 30  minutes.  >50% time spent on counselling and coordination of care with son  POSSIBLE D/C IN 1-2  DAYS, DEPENDING ON CLINICAL CONDITION.  Note: This dictation was prepared with Dragon dictation along with smaller phrase technology. Any transcriptional errors that result from this process are  unintentional.  Kemuel Buchmann M.D on 07/17/2015 at 9:48 AM  Between 7am to 6pm - Pager - [phone]  After 6pm go to [email or web address] - password [email or web address] [facility]  [facility]  Office  [phone]  CC: Primary care physician; BABAOFF, Lavada Mesi, MD

## 2015-07-17 NOTE — Progress Notes (Signed)
Initial Nutrition Assessment  DOCUMENTATION CODES:   Not applicable  INTERVENTION:   Cater to pt preferences; Will recommend downgrading diet order to Mechanical Soft/Dysphagia III as pt reports it would be easier if foods were already cut for her. Recommend Magic Cup BID for added nutrition (each supplement provides approximately 300kcals and 9g protein) as pt reports Ensure/supplement drinks are 'too sweet.'   NUTRITION DIAGNOSIS:   Inadequate oral intake related to poor appetite as evidenced by per patient/family report.  GOAL:   Patient will meet greater than or equal to 90% of their needs  MONITOR:   PO intake, Supplement acceptance, Weight trends, Labs, I & O's  REASON FOR ASSESSMENT:   Malnutrition Screening Tool    ASSESSMENT:   [facility] weakness in lower extremities, hypomagnesemia and hypokalemia.   Past Medical History  Diagnosis Date  . Hypertension   . Hypercholesteremia   . Depression   . Staph infection   . CAP (community acquired pneumonia)     a. 07/2014.  Marland Kitchen. History of TIA (transient ischemic attack)     a. on plavix chronically.  . Cystocele   . Rectocele   . Bladder spasm   . Hyperlipidemia   . Vaginal atrophy   . Bladder instability   . UTI (lower urinary tract infection)      Diet Order:  DIET DYS 3 Room service appropriate?: Yes; Fluid consistency:: Thin   Pt reports eating about half of her french toast this morning with applesauce with meds. 100% of breakfast documented in I/O chart. Pt reports she has been eating the meals here 'ok.'   Pt reports PTA that she is 'not a big eater.' Pt reports not being able to remember much more about actual intake. No family at bedside. RD [facility].  Per [facility] pt with decreased appetite PTA.   Medications: Cholecalciferol, Ferrous Sulfate, Protonix, NS with KCl at 19700mL/hr Labs: K 3.4, Mag 2.0, 1.5, Cre 1.21  Gastrointestinal  Profile: Last BM:  07/15/2015   Nutrition-Focused Physical Exam Findings: Nutrition-Focused physical exam completed. Findings are no fat depletion, mild-moderate muscle depletion, and no edema.     Weight Change: Pt reports weight was 143lbs one week ago.  Per [facility] weight trends pt weight 153lbs one year ago (7% weight loss in one year)   Skin:  Reviewed, no issues   Height:   Ht Readings from Last 1 Encounters:  07/17/15 5' (1.524 m)    Weight:   Wt Readings from Last 1 Encounters:  07/17/15 142 lb 6.4 oz (64.592 kg)   Wt Readings from Last 10 Encounters:  07/17/15 142 lb 6.4 oz (64.592 kg)  06/23/15 140 lb 1.6 oz (63.549 kg)  05/27/15 139 lb 3.2 oz (63.141 kg)  12/23/14 147 lb 9.6 oz (66.951 kg)  10/14/14 144 lb 11.2 oz (65.635 kg)  07/23/14 153 lb 4.8 oz (69.536 kg)    BMI:  Body mass index is 27.81 kg/(m^2).  Estimated Nutritional Needs:   Kcal:  1600-1920kcals  Protein:  70-83g protein  Fluid:  >/= 1.7L fluid  EDUCATION NEEDS:   No education needs identified at this time  Leda QuailAllyson Mirabelle Cyphers, RD, LDN Pager [phone] Weekend/On-Call Pager [phone]

## 2015-07-17 NOTE — Evaluation (Signed)
Physical Therapy Evaluation Patient Details Name: Martha DialsRuth Hunter MRN: [id number] DOB: [birth year] Today's Date: 07/17/2015   History of Present Illness  82 y/o [facility] she has been having recent weakness with an admit 2 months ago.  She is now here with hypokalemia, dehydration and AMS.   Clinical Impression  Pt was able to do some ambulation in hall, but needed a lot of cuing and assist to remain on task and safe.  She normally uses a rollator so the FWW may have been somewhat of an issue, but ultimately pt is not at her baseline and needs much more assist than normal.  She does display confusion and at time flat effect (head CT negative for acute abnormality) but pt does not seem to be processing or overall acting like her normal self (per son, who is present and helpful t/o the session).  Pt's vitals remain stable t/o the session (HR 60-70s, O2 high 90s) and pt does not need excessive assist with mobility but ultimately she would need considerably more assist than at baseline if she tried to go back to ALF in her current state.    Follow Up Recommendations SNF    Equipment Recommendations       Recommendations for Other Services       Precautions / Restrictions Precautions Precautions: Fall Restrictions Weight Bearing Restrictions: No      Mobility  Bed Mobility Overal bed mobility: Needs Assistance Bed Mobility: Supine to Sit     Supine to sit: Min guard;Min assist     General bed mobility comments: Pt does well getting to EOB using rails, needs only light assist to scoot to EOB  Transfers Overall transfer level: Modified independent Equipment used: Rolling walker (2 wheeled)             General transfer comment: Pt needs cues to use UEs appropriately with walker and holding bed rail.  Pt able to phyiscally rise but mentally was limited with problem solving/decision making  Ambulation/Gait Ambulation/Gait assistance: Min  assist Ambulation Distance (Feet): 125 Feet Assistive device: Rolling walker (2 wheeled)       General Gait Details: Pt with very slow, deliberate cadence.  She at time was able to increase speed but needed nearly constant assist to manipulate walker and keep her steeringappropriately and staying in the walker.  Stairs            Wheelchair Mobility    Modified Rankin (Stroke Patients Only)       Balance Overall balance assessment: Needs assistance   Sitting balance-Leahy Scale: Good       Standing balance-Leahy Scale: Fair                               Pertinent Vitals/Pain Pain Assessment: No/denies pain    Home Living Family/patient expects to be discharged to:: Skilled nursing facility                      Prior Function Level of Independence: Independent with assistive device(s) (rollator)         Comments: Pt has been needing more assist since discharge 2 months ago but apparently she could still [facility] [employer] [facility], get out of ALF with family occasionally.     Hand Dominance        Extremity/Trunk Assessment   Upper Extremity Assessment: Generalized weakness (age appropriate deficits)  Lower Extremity Assessment: Generalized weakness (age approprite deficits)         Communication   Communication: Expressive difficulties (slurred speech)  Cognition Arousal/Alertness: Awake/alert (some AMS/confusion) Behavior During Therapy: Anxious (pt appears labile t/o session) Overall Cognitive Status: Impaired/Different from baseline                      General Comments      Exercises        Assessment/Plan    PT Assessment Patient needs continued PT services  PT Diagnosis Difficulty walking;Generalized weakness   PT Problem List Decreased strength;Decreased range of motion;Decreased activity tolerance;Decreased balance;Decreased mobility;Decreased cognition;Decreased knowledge of use of  DME;Decreased safety awareness  PT Treatment Interventions DME instruction;Gait training;Stair training;Therapeutic exercise;Therapeutic activities;Functional mobility training;Balance training;Neuromuscular re-education;Cognitive remediation;Patient/family education   PT Goals (Current goals can be found in the Care Plan section) Acute Rehab PT Goals Patient Stated Goal: get back to her baseline at [facility] PT Goal Formulation: With family Time For Goal Achievement: 07/31/15 Potential to Achieve Goals: Fair    Frequency Min 2X/week   Barriers to discharge        Co-evaluation               End of Session Equipment Utilized During Treatment: Gait belt Activity Tolerance: Patient limited by fatigue Patient left: with chair alarm set;with call bell/phone within reach;with family/visitor present           Time: 1322-1350 PT Time Calculation (min) (ACUTE ONLY): 28 min   Charges:   PT Evaluation $PT Eval Moderate Complexity: 1 Procedure     PT G CodesMalachi Hunter, DPT 07/17/2015, 2:27 PM

## 2015-07-17 NOTE — Clinical Social Work Note (Signed)
Clinical Social Work Assessment  Patient Details  Name: Martha Hunter MRN: [id number] Date of Birth: 12/01/1928  Date of referral:  07/17/15               Reason for consult:  Facility Placement                Permission sought to share information with:  Family Supports Permission granted to share information::  Yes, Verbal Permission Granted  Name::     Martha Hunter, son   Housing/Transportation Living arrangements for the past 2 months:  [place] of Information:  Patient, Adult Children Patient Interpreter Needed:  None Criminal Activity/Legal Involvement Pertinent to Current Situation/Hospitalization:  No - Comment as needed Significant Relationships:  Adult Children Lives with:  Facility Resident Do you feel safe going back to the place where you live?  Yes Need for family participation in patient care:  No (Coment)  Care giving concerns:  No care giving concerns identified.   Social Worker assessment / plan:  CSW met with pt and son to address consult. CSW introduced herself and explained role of social work. CSW also explained the process of discharging to SNF with Medicare. PT is recommending STR at SNF. CSW intiaited a SNF search and will follow up with bed offers. Pt's son shared the plan is for pt to return to [facility] after STR. CSW will continue to follow.   Employment status:  Retired Forensic scientist:  Medicare PT Recommendations:  [place] / Referral to community resources:  [place]  Patient/Family's Response to care:  No care giving concerns identified.   Patient/Family's Understanding of and Emotional Response to Diagnosis, Current Treatment, and Prognosis:  Pt's son understands that pt will need STR prior to returning to ALF.   Emotional Assessment Appearance:  Appears stated age Attitude/Demeanor/Rapport:  Other (Appropriate) Affect (typically observed):  Accepting, Adaptable,  Pleasant Orientation:  Oriented to Self, Oriented to Place, Oriented to  Time, Oriented to Situation Alcohol / Substance use:  Never Used Psych involvement (Current and /or in the community):  No (Comment)  Discharge Needs  Concerns to be addressed:  Adjustment to Illness Readmission within the last 30 days:  No Current discharge risk:  Chronically ill Barriers to Discharge:  Continued Medical Work up   Martha Dates, LCSW 07/17/2015, 4:33 PM

## 2015-07-17 NOTE — Care Management Obs Status (Signed)
MEDICARE OBSERVATION STATUS NOTIFICATION   Patient Details  Name: Martha Hunter MRN: [id number] Date of Birth: [birth year]   Medicare Observation Status Notification Given:  Yes    Gwenette GreetBrenda S Meggin Ola, RN 07/17/2015, 8:45 AM

## 2015-07-17 NOTE — NC FL2 (Signed)
[place] MEDICAID FL2 LEVEL OF CARE SCREENING TOOL     IDENTIFICATION  Patient Name: Martha Hunter Birthdate: [birth year] Sex: female Admission Date (Current Location): 07/16/2015  [place]   Facility and Address:  [facility], [address], [place], [place] [address]      Provider Number: [id number]  Attending Physician Name and Address:  Enedina FinnerSona Patel, MD  Relative Name and Phone Number:       Current Level of Care: Hospital Recommended Level of Care: Skilled Nursing Facility Prior Approval Number:    Date Approved/Denied:   PASRR Number: [id number]  Discharge Plan: SNF    Current Diagnoses: Patient Active Problem List   Diagnosis Date Noted  . Acute renal failure (ARF) (HCC) 07/17/2015  . Hypokalemia 07/16/2015  . Metabolic encephalopathy 05/29/2015  . Altered mental status 05/27/2015  . Anxiety 10/14/2014  . Clinical depression 10/14/2014  . Dizziness 10/14/2014  . HLD (hyperlipidemia) 10/14/2014  . BP (high blood pressure) 10/14/2014  . Adiposity 10/14/2014  . Unstable bladder 10/14/2014  . Cystocele 10/14/2014  . Vaginal atrophy 10/14/2014  . CAP (community acquired pneumonia) 07/23/2014  . Chronic kidney disease (CKD), stage III (moderate) 04/07/2014    Orientation RESPIRATION BLADDER Height & Weight     Self, Time, Situation, Place  Normal Incontinent Weight: 142 lb 6.4 oz (64.592 kg) Height:  5' (152.4 cm)  BEHAVIORAL SYMPTOMS/MOOD NEUROLOGICAL BOWEL NUTRITION STATUS      Continent Diet (DYS 3, Thin Liquids)  AMBULATORY STATUS COMMUNICATION OF NEEDS Skin   Limited Assist Verbally Normal                       Personal Care Assistance Level of Assistance  Bathing, Feeding, Dressing Bathing Assistance: Limited assistance Feeding assistance: Limited assistance Dressing Assistance: Limited assistance     Functional Limitations Info  Sight, Hearing, Speech Sight Info: Adequate Hearing  Info: Adequate Speech Info: Adequate    SPECIAL CARE FACTORS FREQUENCY  PT (By licensed PT)     PT Frequency: 5              Contractures      Additional Factors Info  Code Status, Allergies Code Status Info: Full Code Allergies Info: Allergies: Niacin And Related, Nitrates, Organic, Penicillins, Sulfa Antibiotics           Current Medications (07/17/2015):  This is the current hospital active medication list Current Facility-Administered Medications  Medication Dose Route Frequency Provider Last Rate Last Dose  . 0.9 % NaCl with KCl 20 mEq/ L  infusion   Intravenous Continuous Enedina FinnerSona Patel, MD 100 mL/hr at 07/17/15 1054    . acetaminophen (TYLENOL) tablet 650 mg  650 mg Oral Q6H PRN Alford Highlandichard Wieting, MD       Or  . acetaminophen (TYLENOL) suppository 650 mg  650 mg Rectal Q6H PRN Alford Highlandichard Wieting, MD      . acidophilus (RISAQUAD) capsule   Oral Daily Alford Highlandichard Wieting, MD   1 capsule at 07/17/15 0937  . amLODipine (NORVASC) tablet 10 mg  10 mg Oral Daily Enedina FinnerSona Patel, MD   10 mg at 07/17/15 0936  . calcium carbonate (TUMS - dosed in mg elemental calcium) chewable tablet   Oral Q4H PRN Alford Highlandichard Wieting, MD      . cholecalciferol (VITAMIN D) tablet 1,000 Units  1,000 Units Oral Daily Alford Highlandichard Wieting, MD   1,000 Units at 07/17/15 0936  . clopidogrel (PLAVIX) tablet 75 mg  75 mg  Oral Rayetta Humphrey, MD   75 mg at 07/17/15 9604  . diazepam (VALIUM) tablet 5 mg  5 mg Oral QHS Alford Highland, MD   5 mg at 07/16/15 2147  . enoxaparin (LOVENOX) injection 30 mg  30 mg Subcutaneous Q24H Alford Highland, MD   30 mg at 07/16/15 2147  . ferrous sulfate tablet 325 mg  325 mg Oral QAC breakfast Alford Highland, MD   325 mg at 07/17/15 5409  . hydrALAZINE (APRESOLINE) tablet 50 mg  50 mg Oral BID Alford Highland, MD   50 mg at 07/17/15 8119  . losartan (COZAAR) tablet 100 mg  100 mg Oral Rayetta Humphrey, MD   100 mg at 07/17/15 1478  . ondansetron (ZOFRAN) tablet 4 mg  4 mg Oral Q8H  PRN Alford Highland, MD      . oxybutynin (DITROPAN) tablet 5 mg  5 mg Oral QHS Alford Highland, MD   5 mg at 07/16/15 2147  . pantoprazole (PROTONIX) EC tablet 40 mg  40 mg Oral Daily Alford Highland, MD   40 mg at 07/17/15 0937  . pravastatin (PRAVACHOL) tablet 20 mg  20 mg Oral QPM Alford Highland, MD   20 mg at 07/16/15 1924  . sertraline (ZOLOFT) tablet 100 mg  100 mg Oral Rayetta Humphrey, MD   100 mg at 07/17/15 2956  . sodium chloride flush (NS) 0.9 % injection 3 mL  3 mL Intravenous Q12H Alford Highland, MD   3 mL at 07/17/15 2130     Discharge Medications: Please see discharge summary for a list of discharge medications.  Relevant Imaging Results:  Relevant Lab Results:   Additional Information SSN:  [id number]  Dede Query, LCSW

## 2015-07-18 LAB — BASIC METABOLIC PANEL
Anion gap: 6 (ref 5–15)
BUN: 14 mg/dL (ref 6–20)
CALCIUM: 8.6 mg/dL — AB (ref 8.9–10.3)
CO2: 25 mmol/L (ref 22–32)
CREATININE: 1.06 mg/dL — AB (ref 0.44–1.00)
Chloride: 109 mmol/L (ref 101–111)
GFR, EST AFRICAN AMERICAN: 54 mL/min — AB (ref >60)
GFR, EST NON AFRICAN AMERICAN: 47 mL/min — AB (ref >60)
Glucose, Bld: 91 mg/dL (ref 65–99)
Potassium: 3.5 mmol/L (ref 3.5–5.1)
SODIUM: 140 mmol/L (ref 135–145)

## 2015-07-18 MED ORDER — HALOPERIDOL LACTATE 5 MG/ML IJ SOLN
1.0000 mg | Freq: Once | INTRAMUSCULAR | Status: AC
  Administered 2015-07-18: 01:00:00 1 mg via INTRAVENOUS
  Filled 2015-07-18: qty 1

## 2015-07-18 MED ORDER — GUAIFENESIN-DM 100-10 MG/5ML PO SYRP
5.0000 mL | ORAL_SOLUTION | ORAL | Status: DC | PRN
  Administered 2015-07-18: 5 mL via ORAL
  Filled 2015-07-18: qty 5

## 2015-07-18 MED ORDER — ENOXAPARIN SODIUM 40 MG/0.4ML ~~LOC~~ SOLN
40.0000 mg | SUBCUTANEOUS | Status: DC
  Administered 2015-07-18 – 2015-07-19 (×2): 40 mg via SUBCUTANEOUS
  Filled 2015-07-18 (×2): qty 0.4

## 2015-07-18 MED ORDER — HYDRALAZINE HCL 20 MG/ML IJ SOLN
10.0000 mg | INTRAMUSCULAR | Status: DC | PRN
  Administered 2015-07-18 – 2015-07-19 (×2): 10 mg via INTRAVENOUS
  Filled 2015-07-18 (×2): qty 1

## 2015-07-18 NOTE — Progress Notes (Signed)
Anticoagulation monitoring(Lovenox):  82 yo  ordered Lovenox 30 mg Q24h  Filed Weights   07/16/15 1109 07/17/15 1000  Weight: 143 lb (64.864 kg) 142 lb 6.4 oz (64.592 kg)   Body mass index is 27.81 kg/(m^2).   Lab Results  Component Value Date   CREATININE 1.06* 07/18/2015   CREATININE 1.21* 07/17/2015   CREATININE 1.26* 07/16/2015   Estimated Creatinine Clearance: 33.1 mL/min (by C-G formula based on Cr of 1.06). Hemoglobin & Hematocrit     Component Value Date/Time   HGB 12.4 07/16/2015 1100   HGB 12.2 07/24/2013 0840   HCT 37.3 07/16/2015 1100   HCT 38.5 07/24/2013 0840     Per Protocol for Patient with estCrcl> 30 ml/min and BMI < 40, will transition to Lovenox 40 mg Q24h.

## 2015-07-18 NOTE — Progress Notes (Signed)
Patient ID: Martha Hunter, female   DOB: 01/27/1929, 82 y.o.   MRN: [id number] [facility] [place] [facility]   PATIENT NAME: Martha Hunter    MR#:  [id number]  DATE OF BIRTH:  01/27/1929  SUBJECTIVE:  Patient came in after she will felt very weak and [place] she is normally independent. She required 2 people assist. She was unable to stand to walk . She was found to have severe hypokalemia and malignant hypertension. Patient's appetite is improved. Reports feeling quite weak Sleepy from haldol received last nite  REVIEW OF SYSTEMS:   Review of Systems  Constitutional: Positive for malaise/fatigue. Negative for fever, chills and diaphoresis.  HENT: Negative for congestion, ear pain, hearing loss, nosebleeds and sore throat.   Eyes: Negative for blurred vision, double vision, photophobia and pain.  Respiratory: Negative for hemoptysis, sputum production, wheezing and stridor.   Cardiovascular: Negative for orthopnea, claudication and leg swelling.  Gastrointestinal: Negative for heartburn and abdominal pain.  Genitourinary: Negative for dysuria and frequency.  Musculoskeletal: Positive for joint pain. Negative for back pain and neck pain.  Skin: Negative for rash.  Neurological: Positive for tremors and weakness. Negative for tingling, sensory change, speech change, focal weakness, seizures and headaches.  Endo/Heme/Allergies: Does not bruise/bleed easily.  Psychiatric/Behavioral: Negative for suicidal ideas, memory loss and substance abuse. The patient is not nervous/anxious.    Tolerating Diet:yes Tolerating PT:STR  DRUG ALLERGIES:   Allergies  Allergen Reactions  . Niacin And Related   . Nitrates, Organic Other (See Comments)    Reaction: unknown  . Penicillins Other (See Comments)    Reaction: unknown  . Sulfa Antibiotics Other (See Comments)    Reaction: unknown     VITALS:  Blood pressure 142/51, pulse 66, temperature 98.3 F (36.8  C), temperature source Oral, resp. rate 17, height 5' (1.524 m), weight 64.592 kg (142 lb 6.4 oz), SpO2 92 %.  PHYSICAL EXAMINATION:   Physical Exam  GENERAL:  82 y.o.-year-old patient lying in the bed with no acute distress. obese EYES: Pupils equal, round, reactive to light and accommodation. No scleral icterus. Extraocular muscles intact.  HEENT: Head atraumatic, normocephalic. Oropharynx dry NECK:  Supple, no jugular venous distention. No thyroid enlargement, no tenderness.  LUNGS: Normal breath sounds bilaterally, no wheezing, rales, rhonchi. No use of accessory muscles of respiration.  CARDIOVASCULAR: S1, S2 normal. No murmurs, rubs, or gallops.  ABDOMEN: Soft, nontender, nondistended. Bowel sounds present. No organomegaly or mass.  EXTREMITIES: No cyanosis, clubbing or edema b/l.    NEUROLOGIC: Cranial nerves II through XII are intact. No focal Motor or sensory deficits b/l. Appears weak  PSYCHIATRIC:  patient is alert and oriented x 3.  SKIN: No obvious rash, lesion, or ulcer.   LABORATORY PANEL:  CBC  Recent Labs Lab 07/16/15 1100  WBC 6.1  HGB 12.4  HCT 37.3  PLT 134*    Chemistries   Recent Labs Lab 07/16/15 1100 07/17/15 0500 07/18/15 0513  NA 140 139 140  K 2.9* 3.4* 3.5  CL 104 105 109  CO2 28 25 25   GLUCOSE 123* 98 91  BUN 19 17 14   CREATININE 1.26* 1.21* 1.06*  CALCIUM 9.2 8.3* 8.6*  MG 1.5* 2.0  --   AST 19  --   --   ALT 11*  --   --   ALKPHOS 66  --   --   BILITOT 1.2  --   --    Cardiac Enzymes  Recent  Labs Lab 07/16/15 2023  TROPONINI 0.12*   RADIOLOGY:  No results found. ASSESSMENT AND PLAN:   Martha Hunter is a 82 y.o. female patient brought in to the hospital because she couldn't stand. This morning she got up in the nursing staff was trying to get her out of bed. She couldn't stand up. No strength in her legs. Normally she is able to walk with physical therapy. Her blood pressure in the ER  1. Severe hypokalemia and  hypomagnesemia/acute renal failure - improved now with IV replacement   2. Weakness. Could be secondary to low potassium. Physical therapy evaluation pending. -Given overall decline in patient's condition she would benefit from rehabilitation.PT recommendation for rehab noted  3. Could be a possible bronchitis - will give respiratory treatments try to hold off on antibiotics chest x-ray negative. -no resp symptoms  4. Malignant hypertension blood pressure very variable here with her tremor - continue losartan, hydralazine, resume back amlodipine -Discontinue propranolol due to bradycardia. Patient's heart rate was anywhere from 44-53. Given her symptoms of new onset weakness and combination of dehydration/acute renal failure. -IV when necessary hydralazine for high blood pressure  5. Elevated troponins only borderline no complaints of chest pain or shortness of breath we'll obtain an echocardiogram and another troponin. Monitor on telemetry. Aspirin. -No acute EKG changes for ST elevation or depression -Troponins could be a limited because of low but her creatinine  6. Acute renal failure/Dehydration. Continue IV fluids. - encourage oral fluids  -Baseline creatinine 0.09 -Patient came in with a creatinine of 1.26--- 1.21--1.02  7. Hyperlipidemia continue statins  D/c to rehab in am if pt is stable Case discussed with Care Management/Social Worker. Management plans discussed with the patient, family and they are in agreement.  CODE STATUS: Full  DVT Prophylaxis: Lovenox  TOTAL TIME TAKING CARE OF THIS PATIENT: 30  minutes.  >50% time spent on counselling and coordination of care with son  POSSIBLE D/C IN 1-2  DAYS, DEPENDING ON CLINICAL CONDITION.  Note: This dictation was prepared with Dragon dictation along with smaller phrase technology. Any transcriptional errors that result from this process are unintentional.  Kailan Carmen M.D on 07/18/2015 at 4:05 PM  Between 7am to 6pm -  Pager - [phone]  After 6pm go to [email or web address] - password [email or web address]  [facility]  Office  [phone]  CC: Primary care physician; BABAOFF, Lavada Mesi, MD

## 2015-07-18 NOTE — Clinical Social Work Note (Signed)
[facility], and [facility] extended to patient's son this morning. Patient's son will visit the facilities and let CSW know. CSW will facilitate discharge when expected. York SpanielMonica Lessie Manigo MSW,LCSW [phone]

## 2015-07-19 ENCOUNTER — Inpatient Hospital Stay: Payer: Medicare Other

## 2015-07-19 LAB — URINALYSIS COMPLETE WITH MICROSCOPIC (ARMC ONLY)
BILIRUBIN URINE: NEGATIVE
GLUCOSE, UA: NEGATIVE mg/dL
Ketones, ur: NEGATIVE mg/dL
LEUKOCYTES UA: NEGATIVE
Nitrite: NEGATIVE
Protein, ur: >500 mg/dL — AB
Specific Gravity, Urine: 1.009 (ref 1.005–1.030)
pH: 7 (ref 5.0–8.0)

## 2015-07-19 LAB — CBC WITH DIFFERENTIAL/PLATELET
Basophils Absolute: 0 10*3/uL (ref 0–0.1)
Basophils Relative: 1 %
EOS ABS: 0.1 10*3/uL (ref 0–0.7)
Eosinophils Relative: 2 %
HEMATOCRIT: 37 % (ref 35.0–47.0)
HEMOGLOBIN: 12.6 g/dL (ref 12.0–16.0)
LYMPHS ABS: 0.9 10*3/uL — AB (ref 1.0–3.6)
Lymphocytes Relative: 14 %
MCH: 27.9 pg (ref 26.0–34.0)
MCHC: 34 g/dL (ref 32.0–36.0)
MCV: 82.1 fL (ref 80.0–100.0)
MONOS PCT: 6 %
Monocytes Absolute: 0.4 10*3/uL (ref 0.2–0.9)
NEUTROS ABS: 4.8 10*3/uL (ref 1.4–6.5)
NEUTROS PCT: 77 %
Platelets: 165 10*3/uL (ref 150–440)
RBC: 4.51 MIL/uL (ref 3.80–5.20)
RDW: 15.2 % — ABNORMAL HIGH (ref 11.5–14.5)
WBC: 6.2 10*3/uL (ref 3.6–11.0)

## 2015-07-19 LAB — TROPONIN I: Troponin I: 0.07 ng/mL — ABNORMAL HIGH (ref <0.031)

## 2015-07-19 MED ORDER — HYDRALAZINE HCL 25 MG PO TABS
50.0000 mg | ORAL_TABLET | Freq: Three times a day (TID) | ORAL | Status: DC
  Administered 2015-07-19 – 2015-07-20 (×3): 50 mg via ORAL
  Filled 2015-07-19 (×3): qty 2

## 2015-07-19 MED ORDER — LEVOFLOXACIN IN D5W 250 MG/50ML IV SOLN
250.0000 mg | INTRAVENOUS | Status: DC
  Administered 2015-07-19: 250 mg via INTRAVENOUS
  Filled 2015-07-19 (×2): qty 50

## 2015-07-19 MED ORDER — LEVOFLOXACIN IN D5W 500 MG/100ML IV SOLN
500.0000 mg | INTRAVENOUS | Status: DC
  Filled 2015-07-19: qty 100

## 2015-07-19 NOTE — Progress Notes (Signed)
Patient ID: Martha Hunter, female   DOB: 1929-01-10, 82 y.o.   MRN: [id number] [facility] - [place] at [facility]   PATIENT NAME: Martha Hunter    MR#:  [id number]  DATE OF BIRTH:  Mar 17, 1928  SUBJECTIVE:  Patient came in after she  felt very weak at [employer] she is normally independent. She required 2 people assist. She was unable to stand to walk . She was found to have severe hypokalemia and malignant hypertension. Patient is lethargic today and daughter is concerned as she is not at her baseline. Sleepy , arousable but not answering   REVIEW OF SYSTEMS:   Review of Systems  Unable to perform ROS Patient being very lethargic and unable to perform review of systems Tolerating Diet:yes Tolerating PT:STR  DRUG ALLERGIES:   Allergies  Allergen Reactions  . Niacin And Related   . Nitrates, Organic Other (See Comments)    Reaction: unknown  . Penicillins Other (See Comments)    Reaction: unknown  . Sulfa Antibiotics Other (See Comments)    Reaction: unknown     VITALS:  Blood pressure 163/62, pulse 70, temperature 98 F (36.7 C), temperature source Oral, resp. rate 18, height 5' (1.524 m), weight 64.592 kg (142 lb 6.4 oz), SpO2 95 %.  PHYSICAL EXAMINATION:   Physical Exam  GENERAL:  82 y.o.-year-old patient lying in the bed with no acute distress. obese EYES: Pupils equal, round, reactive to light and accommodation. No scleral icterus.  HEENT: Head atraumatic, normocephalic. Oropharynx dry NECK:  Supple, no jugular venous distention. No thyroid enlargement, no tenderness.  LUNGS: Normal breath sounds bilaterally, no wheezing, rales, rhonchi. No use of accessory muscles of respiration.  CARDIOVASCULAR: S1, S2 normal. No murmurs, rubs, or gallops.  ABDOMEN: Soft, nontender, nondistended. Bowel sounds present. No organomegaly or mass.  EXTREMITIES: No cyanosis, clubbing or edema b/l.    NEUROLOGIC: Cranial nerves II through XII are intact. No  focal Motor or sensory deficits b/l. Appears weak  PSYCHIATRIC:  patient is alert and oriented x 3.  SKIN: No obvious rash, lesion, or ulcer.   LABORATORY PANEL:  CBC  Recent Labs Lab 07/16/15 1100  WBC 6.1  HGB 12.4  HCT 37.3  PLT 134*    Chemistries   Recent Labs Lab 07/16/15 1100 07/17/15 0500 07/18/15 0513  NA 140 139 140  K 2.9* 3.4* 3.5  CL 104 105 109  CO2 GLUCOSE 123* 98 91  BUN CREATININE 1.26* 1.21* 1.06*  CALCIUM 9.2 8.3* 8.6*  MG 1.5* 2.0  --   AST 19  --   --   ALT 11*  --   --   ALKPHOS 66  --   --   BILITOT 1.2  --   --    Cardiac Enzymes  Recent Labs Lab 07/19/15 0911  TROPONINI 0.07*   RADIOLOGY:  No results found. ASSESSMENT AND PLAN:   Martha Hunter is a 82 y.o. female patient brought in to the hospital because she couldn't stand. This morning she got up in the nursing staff was trying to get her out of bed. She couldn't stand up. No strength in her legs. Normally she is able to walk with physical therapy. Her blood pressure in the ER  # AMS  Patient is lethargic which is not her baseline We will get neuro checks Urinalysis and chest x-ray 2 views to rule out aspiration Check CBC and Chem-8. Not considering antibiotics at  this time as the patient is not febrile  # Severe hypokalemia and hypomagnesemia/acute renal failure - improved  with IV replacement   # Weakness. Could be secondary to low potassium.  -Given overall decline in patient's condition she would benefit from rehabilitation.PT recommendation for rehab, will discuss with social worker  #. Acute bronchitis-continues to cough-could be viral/bacterial -Chest x-ray -Aspiration precautions - will give respiratory treatments  -We will start patient on antibiotics Levaquin   #. Malignant hypertension blood pressure very variable here with her tremor - continue losartan, hydralazine 50 mg frequency increased to 3 times a day, resume back  amlodipine -Discontinue propranolol due to bradycardia. Patient's heart rate was anywhere from 44-53. Given her symptoms of new onset weakness and combination of dehydration/acute renal failure. -IV when necessary hydralazine for high blood pressure  #. Elevated troponins only borderline no complaints of chest pain or shortness of breath we'll obtain an echocardiogram and another troponin. Monitor on telemetry. Aspirin. -No acute EKG changes for ST elevation or depression -Troponins trending down 0.12-0.07 -Patient is asymptomatic  #. Acute renal failure/Dehydration. Continue IV fluids at a lower rate - encourage oral fluids with aspiration precautions -Baseline creatinine 0.09 -Patient came in with a creatinine of 1.26--- 1.21--1.02  #. Hyperlipidemia continue statins  D/c to rehab in am if pt is stable Case discussed with Care Management/Social Worker. Management plans discussed with the patient, family and they are in agreement.  CODE STATUS: Full  DVT Prophylaxis: Lovenox  TOTAL TIME TAKING CARE OF THIS PATIENT: 30  minutes.  >50% time spent on counselling and coordination of care with son  POSSIBLE D/C IN 1-2  DAYS, DEPENDING ON CLINICAL CONDITION.  Note: This dictation was prepared with Dragon dictation along with smaller phrase technology. Any transcriptional errors that result from this process are unintentional.  Ramonita LabGouru, Martha Hunter M.D on 07/19/2015 at 11:58 AM  Between 7am to 6pm - Pager - [phone]  After 6pm go to [email or web address] - password [email or web address] [email or web address]  [facility]  Office  [phone]  CC: Primary care physician; BABAOFF, Lavada MesiMARC E, MD

## 2015-07-20 LAB — BASIC METABOLIC PANEL
Anion gap: 8 (ref 5–15)
BUN: 10 mg/dL (ref 6–20)
CHLORIDE: 108 mmol/L (ref 101–111)
CO2: 25 mmol/L (ref 22–32)
CREATININE: 1.16 mg/dL — AB (ref 0.44–1.00)
Calcium: 8.7 mg/dL — ABNORMAL LOW (ref 8.9–10.3)
GFR calc Af Amer: 49 mL/min — ABNORMAL LOW (ref >60)
GFR calc non Af Amer: 42 mL/min — ABNORMAL LOW (ref >60)
Glucose, Bld: 102 mg/dL — ABNORMAL HIGH (ref 65–99)
Potassium: 3.5 mmol/L (ref 3.5–5.1)
SODIUM: 141 mmol/L (ref 135–145)

## 2015-07-20 LAB — CBC WITH DIFFERENTIAL/PLATELET
Basophils Absolute: 0 10*3/uL (ref 0–0.1)
Basophils Relative: 1 %
EOS ABS: 0.3 10*3/uL (ref 0–0.7)
EOS PCT: 4 %
HCT: 37.1 % (ref 35.0–47.0)
HEMOGLOBIN: 12.3 g/dL (ref 12.0–16.0)
LYMPHS ABS: 1.3 10*3/uL (ref 1.0–3.6)
Lymphocytes Relative: 22 %
MCH: 27.7 pg (ref 26.0–34.0)
MCHC: 33.1 g/dL (ref 32.0–36.0)
MCV: 83.9 fL (ref 80.0–100.0)
MONO ABS: 0.4 10*3/uL (ref 0.2–0.9)
MONOS PCT: 7 %
Neutro Abs: 3.9 10*3/uL (ref 1.4–6.5)
Neutrophils Relative %: 66 %
PLATELETS: 157 10*3/uL (ref 150–440)
RBC: 4.43 MIL/uL (ref 3.80–5.20)
RDW: 15.2 % — ABNORMAL HIGH (ref 11.5–14.5)
WBC: 6 10*3/uL (ref 3.6–11.0)

## 2015-07-20 MED ORDER — SODIUM CHLORIDE 0.9 % IV SOLN
INTRAVENOUS | Status: DC
  Administered 2015-07-20: 10:00:00 via INTRAVENOUS

## 2015-07-20 MED ORDER — HYDRALAZINE HCL 50 MG PO TABS: 75.0000 mg | ORAL_TABLET | Freq: Three times a day (TID) | ORAL | Status: AC

## 2015-07-20 MED ORDER — AMLODIPINE BESYLATE 10 MG PO TABS: 10.0000 mg | ORAL_TABLET | Freq: Every day | ORAL | Status: AC

## 2015-07-20 MED ORDER — GUAIFENESIN-DM 100-10 MG/5ML PO SYRP: 5.0000 mL | ORAL_SOLUTION | ORAL | Status: AC | PRN

## 2015-07-20 MED ORDER — LEVOFLOXACIN 250 MG PO TABS: 250.0000 mg | ORAL_TABLET | Freq: Every day | ORAL | Status: DC

## 2015-07-20 NOTE — Discharge Instructions (Signed)
Activity per PT Follow-up with primary care physician at the facility in 2-3 days   cardiac diet

## 2015-07-20 NOTE — Progress Notes (Signed)
EMS arrived. Patient discharged via EMS to [facility]. Bo McclintockBrewer,Geneive Sandstrom S, RN

## 2015-07-20 NOTE — Care Management Important Message (Signed)
Important Message  Patient Details  Name: Martha DialsRuth Hunter MRN: [id number] Date of Birth: 12-21-1928   Medicare Important Message Given:  Yes    Gwenette GreetBrenda S Supreme Rybarczyk, RN 07/20/2015, 8:39 AM

## 2015-07-20 NOTE — Discharge Summary (Signed)
[facility] [place] [facility]   PATIENT NAME: Martha Hunter    MR#:  [id number]  DATE OF BIRTH:  [birth year]  DATE OF ADMISSION:  07/16/2015 ADMITTING PHYSICIAN: Alford Highland, MD  DATE OF DISCHARGE: 07/20/15  PRIMARY CARE PHYSICIAN: BABAOFF, MARC E, MD    ADMISSION DIAGNOSIS:  Weakness [R53.1] Elevated troponin [R79.89]  DISCHARGE DIAGNOSIS:  Active Problems:   Hypokalemia   Acute renal failure (ARF) (HCC) Acute bronchitis  SECONDARY DIAGNOSIS:   Past Medical History  Diagnosis Date  . Hypertension   . Hypercholesteremia   . Depression   . Staph infection   . CAP (community acquired pneumonia)     a. 07/2014.  Marland Kitchen History of TIA (transient ischemic attack)     a. on plavix chronically.  . Cystocele   . Rectocele   . Bladder spasm   . Hyperlipidemia   . Vaginal atrophy   . Bladder instability   . UTI (lower urinary tract infection)     HOSPITAL COURSE:  Martha Hunter is a 82 y.o. female patient brought in to the hospital because she couldn't stand. This morning she got up in the nursing staff was trying to get her out of bed. She couldn't stand up. No strength in her legs. Normally she is able to walk with physical therapy. Her blood pressure in the ER  # AMS  Patient is doing much better , almost at her baseline per her son Urinalysis negative and chest x-ray 2 views no acute findings  Improving with the Levaquin for bronchitis   #  Severe hypokalemia and hypomagnesemia/acute renal failure - improved with IV replacement   # Weakness. Could be secondary to low potassium.  -Given overall decline in patient's condition she would benefit from rehabilitation.PT recommendation for rehab, will discuss with social worker  #. Acute bronchitis-continues to cough-could be viral/bacterial -Chest x-ray negative -Aspiration precautions - will give respiratory treatments  - antibiotics Levaquin for 5 days   #. Malignant hypertension  blood pressure very variable here with her tremor - continue losartan, hydralazine 50 mg, changed to 75 mg frequency increased to 3 times a day, resume back amlodipine, titrate medications as needed  -Discontinue propranolol due to bradycardia. Patient's heart rate was anywhere from 44-53. Given her symptoms of new onset weakness and combination of dehydration/acute renal failure. -IV when necessary hydralazine for high blood pressure  #. Elevated troponins only borderline no complaints of chest pain or shortness of breath we'll obtain an echocardiogram and another troponin. Monitor on telemetry. Aspirin. -No acute EKG changes for ST elevation or depression -Troponins trending down 0.12-0.07 -Patient is asymptomatic  #. Acute renal failure/Dehydration.  provided IV fluids at a lower rate - encourage oral fluids with aspiration precautions -Baseline creatinine 0.09 -Patient came in with a creatinine of 1.26--- 1.21--1.02  #. Hyperlipidemia continue statins  Disposition skilled nursing care for rehabilitation  DISCHARGE CONDITIONS:  fair  CONSULTS OBTAINED:      PROCEDURES none  DRUG ALLERGIES:   Allergies  Allergen Reactions  . Niacin And Related   . Nitrates, Organic Other (See Comments)    Reaction: unknown  . Penicillins Other (See Comments)    Reaction: unknown  . Sulfa Antibiotics Other (See Comments)    Reaction: unknown     DISCHARGE MEDICATIONS:   Current Discharge Medication List    START taking these medications   Details  amLODipine (NORVASC) 10 MG tablet Take 1 tablet (10 mg total) by mouth daily. Qty:  30 tablet, Refills: 0    guaiFENesin-dextromethorphan (ROBITUSSIN DM) 100-10 MG/5ML syrup Take 5 mLs by mouth every 4 (four) hours as needed for cough. Qty: 118 mL, Refills: 0    levofloxacin (LEVAQUIN) 250 MG tablet Take 1 tablet (250 mg total) by mouth daily. Qty: 5 tablet, Refills: 0      CONTINUE these medications which have CHANGED   Details   hydrALAZINE (APRESOLINE) 50 MG tablet Take 1.5 tablets (75 mg total) by mouth every 8 (eight) hours. Qty: 90 tablet, Refills: 0      CONTINUE these medications which have NOT CHANGED   Details  acetaminophen (TYLENOL) 500 MG tablet Take 500-1,000 mg by mouth every 6 (six) hours as needed for mild pain.    calcium carbonate (TUMS EX) 750 MG chewable tablet Chew 1 tablet by mouth every 4 (four) hours as needed for heartburn.    Cholecalciferol (VITAMIN D-3) 1000 UNITS CAPS Take 1 capsule by mouth daily.     clopidogrel (PLAVIX) 75 MG tablet Take 75 mg by mouth every morning.     diazepam (VALIUM) 5 MG tablet Take 5 mg by mouth at bedtime.     estradiol (ESTRACE VAGINAL) 0.1 MG/GM vaginal cream Place 1 Applicatorful vaginally 2 (two) times a week. 1/2 gram twice weekly. Qty: 42.5 g, Refills: 12    ferrous sulfate 325 (65 FE) MG tablet Take 325 mg by mouth daily.     loperamide (IMODIUM) 2 MG capsule Take 2 mg by mouth 3 (three) times daily as needed for diarrhea or loose stools.    losartan (COZAAR) 100 MG tablet Take 100 mg by mouth every morning.     omeprazole (PRILOSEC) 20 MG capsule Take 20 mg by mouth daily.    ondansetron (ZOFRAN) 4 MG tablet Take 4 mg by mouth every 8 (eight) hours as needed for nausea or vomiting.    oxybutynin (DITROPAN) 5 MG tablet Take 5 mg by mouth at bedtime.     pravastatin (PRAVACHOL) 20 MG tablet Take 20 mg by mouth every evening.     Probiotic Product (PROBIOTIC-10 ULTIMATE PO) Take 1 capsule by mouth daily.    sertraline (ZOLOFT) 100 MG tablet Take 100 mg by mouth every morning.       STOP taking these medications     propranolol (INDERAL) 40 MG tablet          DISCHARGE INSTRUCTIONS:   Activity per PT Follow-up with primary care physician at the facility in 2-3 days   cardiac diet  DIET :  Cardiac diet  DISCHARGE CONDITION:  Fair  ACTIVITY:  Activity as tolerated perdischargeHistory and [address] and  OXYGEN:  Home  Oxygen:  No    Oxygen Delivery: none  DISCHARGE LOCATION:  SNF  If you experience worsening of your admission symptoms, develop shortness of breath, life threatening emergency, suicidal or homicidal thoughts you must seek medical attention immediately by calling 911 or calling your MD immediately  if symptoms less severe.  You Must read complete instructions/literature along with all the possible adverse reactions/side effects for all the Medicines you take and that have been prescribed to you. Take any new Medicines after you have completely understood and accpet all the possible adverse reactions/side effects.   Please note  You were cared for by a hospitalist during your hospital stay. If you have any questions about your discharge medications or the care you received while you were in the hospital after you are discharged, you can call the unit  and asked to speak with the hospitalist on call if the hospitalist that took care of you is not available. Once you are discharged, your primary care physician will handle any further medical issues. Please note that NO REFILLS for any discharge medications will be authorized once you are discharged, as it is imperative that you return to your primary care physician (or establish a relationship with a primary care physician if you do not have one) for your aftercare needs so that they can reassess your need for medications and monitor your lab values.     Today  Chief Complaint  Patient presents with  . Weakness   Today patient is doing much better. More awake and alert. According to the son she is almost at her baseline. Still coughing but better   ROS:  CONSTITUTIONAL: Denies fevers, chills. Denies any fatigue, weakness.  EYES: Denies blurry vision, double vision, eye pain. EARS, NOSE, THROAT: Denies tinnitus, ear pain, hearing loss. RESPIRATORY: Reports cough,  denies wheeze, shortness of breath.  CARDIOVASCULAR: Denies chest pain,  palpitations, edema.  GASTROINTESTINAL: Denies nausea, vomiting, diarrhea, abdominal pain. Denies bright red blood per rectum. GENITOURINARY: Denies dysuria, hematuria. ENDOCRINE: Denies nocturia or thyroid problems. HEMATOLOGIC AND LYMPHATIC: Denies easy bruising or bleeding. SKIN: Denies rash or lesion. MUSCULOSKELETAL: Denies pain in neck, back, shoulder, knees, hips or arthritic symptoms.  NEUROLOGIC: Denies paralysis, paresthesias.  PSYCHIATRIC: Denies anxiety or depressive symptoms.   VITAL SIGNS:  Blood pressure 161/69, pulse 74, temperature 98.2 F (36.8 C), temperature source Oral, resp. rate 17, height 5' (1.524 m), weight 64.592 kg (142 lb 6.4 oz), SpO2 96 %.  I/O:    Intake/Output Summary (Last 24 hours) at 07/20/15 1209 Last data filed at 07/20/15 0742  Gross per 24 hour  Intake 1448.33 ml  Output      0 ml  Net 1448.33 ml    PHYSICAL EXAMINATION:  GENERAL:  82 y.o.-year-old patient lying in the bed with no acute distress.  EYES: Pupils equal, round, reactive to light and accommodation. No scleral icterus. Extraocular muscles intact.  HEENT: Head atraumatic, normocephalic. Oropharynx and nasopharynx clear.  NECK:  Supple, no jugular venous distention. No thyroid enlargement, no tenderness.  LUNGS:Moderate course  breath sounds bilaterally, no wheezing, rales,rhonchi or crepitation. No use of accessory muscles of respiration.  CARDIOVASCULAR: S1, S2 normal. No murmurs, rubs, or gallops.  ABDOMEN: Soft, non-tender, non-distended. Bowel sounds present. No organomegaly or mass.  EXTREMITIES: No pedal edema, cyanosis, or clubbing.  NEUROLOGIC: Cranial nerves II through XII are intact. Muscle strength 5/5 in all extremities. Sensation intact. Gait not checked.  PSYCHIATRIC: The patient is alert and oriented x 3.  almost at her baseline according to the son SKIN: No obvious rash, lesion, or ulcer.   DATA REVIEW:   CBC  Recent Labs Lab 07/20/15 0533  WBC 6.0  HGB  12.3  HCT 37.1  PLT 157    Chemistries   Recent Labs Lab 07/16/15 1100 07/17/15 0500  07/20/15 0533  NA 140 139  < > 141  K 2.9* 3.4*  < > 3.5  CL 104 105  < > 108  CO2 28 25  < > 25  GLUCOSE 123* 98  < > 102*  BUN 19 17  < > 10  CREATININE 1.26* 1.21*  < > 1.16*  CALCIUM 9.2 8.3*  < > 8.7*  MG 1.5* 2.0  --   --   AST 19  --   --   --  ALT 11*  --   --   --   ALKPHOS 66  --   --   --   BILITOT 1.2  --   --   --   < > = values in this interval not displayed.  Cardiac Enzymes  Recent Labs Lab 07/19/15 0911  TROPONINI 0.07*    Microbiology Results  Results for orders placed or performed during the hospital encounter of 07/16/15  MRSA PCR Screening     Status: None   Collection Time: 07/16/15  5:40 PM  Result Value Ref Range Status   MRSA by PCR NEGATIVE NEGATIVE Final    Comment:        The GeneXpert MRSA Assay (FDA approved for NASAL specimens only), is one component of a comprehensive MRSA colonization surveillance program. It is not intended to diagnose MRSA infection nor to guide or monitor treatment for MRSA infections.     RADIOLOGY:  Dg Chest 2 View  07/19/2015  CLINICAL DATA:  Generalize weakness and shortness of the breath EXAM: CHEST  2 VIEW COMPARISON:  07/16/2015 FINDINGS: Mild cardiac enlargement. No pleural effusion or edema. No airspace consolidation identified. The visualized osseous structures appear intact. IMPRESSION: 1. Mild cardiac enlargement, no acute findings. Electronically Signed   By: Signa Kell M.D.   On: 07/19/2015 12:47   Ct Head Wo Contrast  07/16/2015  CLINICAL DATA:  Confusion/altered mental status for 1 day EXAM: CT HEAD WITHOUT CONTRAST TECHNIQUE: Contiguous axial images were obtained from the base of the skull through the vertex without intravenous contrast. COMPARISON:  Head CT May 27, 2015 and brain MR May 29, 2015 FINDINGS: Moderate diffuse atrophy is stable. There is no intracranial mass, hemorrhage, extra-axial  fluid collection, or midline shift. There is patchy small vessel disease throughout the centra semiovale bilaterally. There is evidence of a prior lacunar infarct involving the superior anterior left lentiform nucleus extending into the inferior anterior left centrum semiovale. There is a prior lacunar infarct in the anterior right centrum semiovale. There is evidence of a prior infarct in the periphery of the left cerebellar hemisphere, stable. There is no demonstrable new gray-white compartment lesion. No acute infarct evident. The bony calvarium appears intact. The mastoid air cells are clear. Orbits appear symmetric bilaterally. Cavernous carotid artery calcification noted bilaterally. IMPRESSION: Stable atrophy with small vessel disease and prior infarcts as noted above. No acute infarct evident. No hemorrhage or mass effect. Electronically Signed   By: Bretta Bang III M.D.   On: 07/16/2015 12:45    EKG:   Orders placed or performed during the hospital encounter of 07/16/15  . EKG 12-Lead  . EKG 12-Lead  . ED EKG  . ED EKG      Management plans discussed with the patient, family and they are in agreement.  CODE STATUS:     Code Status Orders        Start     Ordered   07/16/15 1528  Full code   Continuous     07/16/15 1528    Code Status History    Date Active Date Inactive Code Status Order ID Comments User Context   05/27/2015  3:26 PM 05/30/2015  5:28 PM Full Code [id number]  Houston Siren, MD Inpatient   07/20/2014  5:54 AM 07/23/2014  6:35 PM Full Code [id number]  Arnaldo Natal, MD Inpatient    Advance Directive Documentation        Most Recent Value   Type of Advance Directive  Living will   Pre-existing out of facility DNR order (yellow form or pink MOST form)     "MOST" Form in Place?        TOTAL TIME TAKING CARE OF THIS PATIENT: 45 minutes.    @[facility] @  on 07/20/2015 at 12:09 PM  Between 7am to 6pm - Pager - [phone]  After 6pm go to [email or web address] -  password [email or web address] [facility]  [facility]  Office  [phone]  CC: Primary care physician; BABAOFF, Lavada Mesi, MD

## 2015-07-20 NOTE — Progress Notes (Signed)
EMS called for transport. RN aware. Czarina Gingras S, RN  

## 2015-07-20 NOTE — Progress Notes (Signed)
Called report to [facility].

## 2015-07-25 NOTE — Clinical Social Work Placement (Signed)
   CLINICAL SOCIAL WORK PLACEMENT  NOTE  Date:  07/25/2015  Patient Details  Name: Martha DialsRuth Dominy MRN: [id number] Date of Birth: 1929/01/26  Clinical Social Work is seeking post-discharge placement for this patient at the Skilled  Nursing Facility level of care (*CSW will initial, date and re-position this form in  chart as items are completed):  Yes   Patient/family provided with [facility] Clinical Social Work Department's list of facilities offering this level of care within the geographic area requested by the patient (or if unable, by the patient's family).  Yes   Patient/family informed of their freedom to choose among providers that offer the needed level of care, that participate in Medicare, Medicaid or managed care program needed by the patient, have an available bed and are willing to accept the patient.  Yes   Patient/family informed of [facility]'s ownership interest in [facility] and [facility], as well as of the fact that they are under no obligation to receive care at these facilities.  PASRR submitted to EDS on       PASRR number received on       Existing PASRR number confirmed on 07/17/15     FL2 transmitted to all facilities in geographic area requested by pt/family on 07/17/15     FL2 transmitted to all facilities within larger geographic area on       Patient informed that his/her managed care company has contracts with or will negotiate with certain facilities, including the following:        Yes   Patient/family informed of bed offers received.  [facility]     Physician recommends and patient chooses bed at      Patient to be transferred to [facility] [place] on 07/20/15.  [facility]     Patient family notified on 07/20/15 of transfer.  Name of family member notified:  Pt's son      PHYSICIAN       Additional Comment:     _______________________________________________ Dede QuerySarah Clorissa Gruenberg, LCSW 07/25/2015, 10:45 AM

## 2015-07-25 NOTE — Clinical Social Work Note (Signed)
Pt ready to discharge to [facility]. Facility ready to admit pt as they received discharge information. Pt and family are aware and in agreement with discharge plan. RN called report and EMS provided transportation to facility. CSW is signing off as no further needs identified.   Dede QuerySarah Anvith Mauriello, MSW, LCSW  Clinical Social Worker  [phone]

## 2015-08-28 ENCOUNTER — Encounter: Payer: Self-pay | Admitting: Emergency Medicine

## 2015-08-28 ENCOUNTER — Observation Stay
Admission: EM | Admit: 2015-08-28 | Discharge: 2015-08-31 | Disposition: A | Discharge status: Home / Self Care | Payer: Medicare Other | Attending: Internal Medicine | Admitting: Internal Medicine

## 2015-08-28 ENCOUNTER — Emergency Department: Payer: Medicare Other

## 2015-08-28 DIAGNOSIS — Z880 Allergy status to penicillin: Secondary | ICD-10-CM | POA: Diagnosis not present

## 2015-08-28 DIAGNOSIS — Z882 Allergy status to sulfonamides status: Secondary | ICD-10-CM | POA: Diagnosis not present

## 2015-08-28 DIAGNOSIS — G9349 Other encephalopathy: Secondary | ICD-10-CM | POA: Diagnosis not present

## 2015-08-28 DIAGNOSIS — I444 Left anterior fascicular block: Secondary | ICD-10-CM | POA: Insufficient documentation

## 2015-08-28 DIAGNOSIS — Z8673 Personal history of transient ischemic attack (TIA), and cerebral infarction without residual deficits: Secondary | ICD-10-CM | POA: Insufficient documentation

## 2015-08-28 DIAGNOSIS — N3000 Acute cystitis without hematuria: Secondary | ICD-10-CM | POA: Insufficient documentation

## 2015-08-28 DIAGNOSIS — L8990 Pressure ulcer of unspecified site, unspecified stage: Secondary | ICD-10-CM | POA: Diagnosis not present

## 2015-08-28 DIAGNOSIS — Z7902 Long term (current) use of antithrombotics/antiplatelets: Secondary | ICD-10-CM | POA: Diagnosis not present

## 2015-08-28 DIAGNOSIS — Z9889 Other specified postprocedural states: Secondary | ICD-10-CM | POA: Insufficient documentation

## 2015-08-28 DIAGNOSIS — R531 Weakness: Secondary | ICD-10-CM | POA: Diagnosis present

## 2015-08-28 DIAGNOSIS — E7800 Pure hypercholesterolemia, unspecified: Secondary | ICD-10-CM | POA: Diagnosis not present

## 2015-08-28 DIAGNOSIS — E785 Hyperlipidemia, unspecified: Secondary | ICD-10-CM | POA: Insufficient documentation

## 2015-08-28 DIAGNOSIS — F329 Major depressive disorder, single episode, unspecified: Secondary | ICD-10-CM | POA: Diagnosis not present

## 2015-08-28 DIAGNOSIS — E43 Unspecified severe protein-calorie malnutrition: Secondary | ICD-10-CM | POA: Diagnosis not present

## 2015-08-28 DIAGNOSIS — N3289 Other specified disorders of bladder: Secondary | ICD-10-CM | POA: Diagnosis not present

## 2015-08-28 DIAGNOSIS — Z888 Allergy status to other drugs, medicaments and biological substances status: Secondary | ICD-10-CM | POA: Insufficient documentation

## 2015-08-28 DIAGNOSIS — I1310 Hypertensive heart and chronic kidney disease without heart failure, with stage 1 through stage 4 chronic kidney disease, or unspecified chronic kidney disease: Secondary | ICD-10-CM | POA: Insufficient documentation

## 2015-08-28 DIAGNOSIS — N183 Chronic kidney disease, stage 3 (moderate): Secondary | ICD-10-CM | POA: Diagnosis not present

## 2015-08-28 DIAGNOSIS — N390 Urinary tract infection, site not specified: Secondary | ICD-10-CM | POA: Diagnosis present

## 2015-08-28 DIAGNOSIS — Z8249 Family history of ischemic heart disease and other diseases of the circulatory system: Secondary | ICD-10-CM | POA: Insufficient documentation

## 2015-08-28 DIAGNOSIS — Z87891 Personal history of nicotine dependence: Secondary | ICD-10-CM | POA: Insufficient documentation

## 2015-08-28 DIAGNOSIS — Z79899 Other long term (current) drug therapy: Secondary | ICD-10-CM | POA: Insufficient documentation

## 2015-08-28 DIAGNOSIS — H6120 Impacted cerumen, unspecified ear: Secondary | ICD-10-CM | POA: Insufficient documentation

## 2015-08-28 DIAGNOSIS — I739 Peripheral vascular disease, unspecified: Secondary | ICD-10-CM | POA: Insufficient documentation

## 2015-08-28 DIAGNOSIS — N179 Acute kidney failure, unspecified: Secondary | ICD-10-CM | POA: Insufficient documentation

## 2015-08-28 DIAGNOSIS — E860 Dehydration: Secondary | ICD-10-CM

## 2015-08-28 DIAGNOSIS — G9340 Encephalopathy, unspecified: Secondary | ICD-10-CM | POA: Diagnosis present

## 2015-08-28 DIAGNOSIS — K219 Gastro-esophageal reflux disease without esophagitis: Secondary | ICD-10-CM | POA: Diagnosis not present

## 2015-08-28 DIAGNOSIS — Z66 Do not resuscitate: Secondary | ICD-10-CM | POA: Insufficient documentation

## 2015-08-28 DIAGNOSIS — R4702 Dysphasia: Secondary | ICD-10-CM | POA: Diagnosis not present

## 2015-08-28 LAB — COMPREHENSIVE METABOLIC PANEL WITH GFR
ALT: 8 U/L — ABNORMAL LOW (ref 14–54)
AST: 15 U/L (ref 15–41)
Albumin: 3.8 g/dL (ref 3.5–5.0)
Alkaline Phosphatase: 71 U/L (ref 38–126)
Anion gap: 7 (ref 5–15)
BUN: 38 mg/dL — ABNORMAL HIGH (ref 6–20)
CO2: 22 mmol/L (ref 22–32)
Calcium: 9.8 mg/dL (ref 8.9–10.3)
Chloride: 108 mmol/L (ref 101–111)
Creatinine, Ser: 1.72 mg/dL — ABNORMAL HIGH (ref 0.44–1.00)
GFR calc Af Amer: 30 mL/min — ABNORMAL LOW (ref >60)
GFR calc non Af Amer: 26 mL/min — ABNORMAL LOW (ref >60)
Glucose, Bld: 93 mg/dL (ref 65–99)
Potassium: 4.7 mmol/L (ref 3.5–5.1)
Sodium: 137 mmol/L (ref 135–145)
Total Bilirubin: 0.7 mg/dL (ref 0.3–1.2)
Total Protein: 6.9 g/dL (ref 6.5–8.1)

## 2015-08-28 LAB — CBC WITH DIFFERENTIAL/PLATELET
Basophils Absolute: 0.1 10*3/uL (ref 0–0.1)
Basophils Relative: 1 %
Eosinophils Absolute: 0.2 10*3/uL (ref 0–0.7)
Eosinophils Relative: 2 %
HEMATOCRIT: 37.8 % (ref 35.0–47.0)
HEMOGLOBIN: 12.7 g/dL (ref 12.0–16.0)
LYMPHS ABS: 0.9 10*3/uL — AB (ref 1.0–3.6)
LYMPHS PCT: 11 %
MCH: 27.7 pg (ref 26.0–34.0)
MCHC: 33.5 g/dL (ref 32.0–36.0)
MCV: 82.6 fL (ref 80.0–100.0)
MONOS PCT: 5 %
Monocytes Absolute: 0.4 10*3/uL (ref 0.2–0.9)
NEUTROS PCT: 81 %
Neutro Abs: 6.5 10*3/uL (ref 1.4–6.5)
Platelets: 212 10*3/uL (ref 150–440)
RBC: 4.57 MIL/uL (ref 3.80–5.20)
RDW: 16.1 % — ABNORMAL HIGH (ref 11.5–14.5)
WBC: 8 10*3/uL (ref 3.6–11.0)

## 2015-08-28 LAB — URINALYSIS COMPLETE WITH MICROSCOPIC (ARMC ONLY)
Bilirubin Urine: NEGATIVE
Glucose, UA: NEGATIVE mg/dL
Hgb urine dipstick: NEGATIVE
Ketones, ur: NEGATIVE mg/dL
Nitrite: POSITIVE — AB
Protein, ur: 100 mg/dL — AB
Specific Gravity, Urine: 1.011 (ref 1.005–1.030)
Squamous Epithelial / HPF: NONE SEEN
pH: 5 (ref 5.0–8.0)

## 2015-08-28 LAB — TROPONIN I

## 2015-08-28 LAB — MRSA PCR SCREENING: MRSA BY PCR: POSITIVE — AB

## 2015-08-28 MED ORDER — ENOXAPARIN SODIUM 40 MG/0.4ML ~~LOC~~ SOLN
30.0000 mg | SUBCUTANEOUS | Status: DC
  Administered 2015-08-28 – 2015-08-30 (×3): 30 mg via SUBCUTANEOUS
  Filled 2015-08-28 (×3): qty 0.4

## 2015-08-28 MED ORDER — ONDANSETRON HCL 4 MG PO TABS: 4.0000 mg | ORAL_TABLET | Freq: Three times a day (TID) | ORAL | Status: DC | PRN

## 2015-08-28 MED ORDER — DEXTROSE 5 % IV SOLN
1.0000 g | INTRAVENOUS | Status: DC
  Administered 2015-08-29 – 2015-08-30 (×2): 1 g via INTRAVENOUS
  Filled 2015-08-28 (×2): qty 10

## 2015-08-28 MED ORDER — DEXTROSE 5 % IV SOLN
1.0000 g | Freq: Once | INTRAVENOUS | Status: AC
  Administered 2015-08-28: 1 g via INTRAVENOUS
  Filled 2015-08-28: qty 10

## 2015-08-28 MED ORDER — VITAMIN D 1000 UNITS PO TABS
1000.0000 [IU] | ORAL_TABLET | Freq: Every day | ORAL | Status: DC
  Administered 2015-08-28 – 2015-08-31 (×4): 1000 [IU] via ORAL
  Filled 2015-08-28 (×4): qty 1

## 2015-08-28 MED ORDER — POTASSIUM CHLORIDE CRYS ER 20 MEQ PO TBCR
20.0000 meq | EXTENDED_RELEASE_TABLET | Freq: Every day | ORAL | Status: DC
Start: 2015-08-29 — End: 2015-08-31
  Administered 2015-08-29 – 2015-08-31 (×3): 20 meq via ORAL
  Filled 2015-08-28 (×3): qty 1

## 2015-08-28 MED ORDER — CLOPIDOGREL BISULFATE 75 MG PO TABS
75.0000 mg | ORAL_TABLET | ORAL | Status: DC
  Administered 2015-08-29 – 2015-08-31 (×3): 75 mg via ORAL
  Filled 2015-08-28 (×3): qty 1

## 2015-08-28 MED ORDER — DIAZEPAM 5 MG PO TABS
5.0000 mg | ORAL_TABLET | Freq: Every day | ORAL | Status: DC
  Administered 2015-08-28 – 2015-08-30 (×3): 5 mg via ORAL
  Filled 2015-08-28 (×3): qty 1

## 2015-08-28 MED ORDER — ACETAMINOPHEN 650 MG RE SUPP: 650.0000 mg | Freq: Four times a day (QID) | RECTAL | Status: DC | PRN

## 2015-08-28 MED ORDER — PRAVASTATIN SODIUM 10 MG PO TABS
20.0000 mg | ORAL_TABLET | Freq: Every evening | ORAL | Status: DC
Start: 2015-08-28 — End: 2015-08-31
  Administered 2015-08-28 – 2015-08-30 (×3): 20 mg via ORAL
  Filled 2015-08-28 (×3): qty 2

## 2015-08-28 MED ORDER — RISAQUAD PO CAPS
1.0000 | ORAL_CAPSULE | Freq: Every day | ORAL | Status: DC
  Administered 2015-08-29 – 2015-08-31 (×3): 1 via ORAL
  Filled 2015-08-28 (×3): qty 1

## 2015-08-28 MED ORDER — CALCIUM CARBONATE ANTACID 500 MG PO CHEW: 1.0000 | CHEWABLE_TABLET | ORAL | Status: DC | PRN

## 2015-08-28 MED ORDER — ENSURE ENLIVE PO LIQD
237.0000 mL | Freq: Two times a day (BID) | ORAL | Status: DC
  Administered 2015-08-28 – 2015-08-31 (×6): 237 mL via ORAL

## 2015-08-28 MED ORDER — AMLODIPINE BESYLATE 10 MG PO TABS
10.0000 mg | ORAL_TABLET | Freq: Every day | ORAL | Status: DC
  Administered 2015-08-29 – 2015-08-31 (×3): 10 mg via ORAL
  Filled 2015-08-28 (×3): qty 1

## 2015-08-28 MED ORDER — SODIUM CHLORIDE 0.9 % IV SOLN
1000.0000 mL | Freq: Once | INTRAVENOUS | Status: AC
  Administered 2015-08-28: 1000 mL via INTRAVENOUS

## 2015-08-28 MED ORDER — LOPERAMIDE HCL 2 MG PO CAPS: 2.0000 mg | ORAL_CAPSULE | Freq: Three times a day (TID) | ORAL | Status: DC | PRN

## 2015-08-28 MED ORDER — FERROUS SULFATE 325 (65 FE) MG PO TABS
325.0000 mg | ORAL_TABLET | Freq: Every day | ORAL | Status: DC
  Administered 2015-08-29 – 2015-08-30 (×2): 325 mg via ORAL
  Filled 2015-08-28 (×2): qty 1

## 2015-08-28 MED ORDER — SODIUM CHLORIDE 0.9 % IV SOLN
INTRAVENOUS | Status: DC
  Administered 2015-08-28 – 2015-08-30 (×3): via INTRAVENOUS

## 2015-08-28 MED ORDER — PANTOPRAZOLE SODIUM 40 MG PO TBEC
40.0000 mg | DELAYED_RELEASE_TABLET | Freq: Every day | ORAL | Status: DC
  Administered 2015-08-28 – 2015-08-31 (×4): 40 mg via ORAL
  Filled 2015-08-28 (×4): qty 1

## 2015-08-28 MED ORDER — ACETAMINOPHEN 325 MG PO TABS
650.0000 mg | ORAL_TABLET | Freq: Four times a day (QID) | ORAL | Status: DC | PRN
Start: 2015-08-28 — End: 2015-08-31

## 2015-08-28 MED ORDER — CARBAMIDE PEROXIDE 6.5 % OT SOLN
5.0000 [drp] | Freq: Two times a day (BID) | OTIC | Status: DC
  Administered 2015-08-28 – 2015-08-31 (×6): 5 [drp] via OTIC
  Filled 2015-08-28: qty 15

## 2015-08-28 MED ORDER — HYDRALAZINE HCL 50 MG PO TABS
75.0000 mg | ORAL_TABLET | Freq: Three times a day (TID) | ORAL | Status: DC
  Administered 2015-08-28 – 2015-08-29 (×3): 75 mg via ORAL
  Filled 2015-08-28 (×3): qty 1

## 2015-08-28 MED ORDER — PROBIOTIC-10 ULTIMATE PO CAPS: ORAL_CAPSULE | Freq: Every day | ORAL | Status: DC

## 2015-08-28 MED ORDER — LOSARTAN POTASSIUM 50 MG PO TABS
100.0000 mg | ORAL_TABLET | ORAL | Status: DC
  Administered 2015-08-28 – 2015-08-31 (×4): 100 mg via ORAL
  Filled 2015-08-28 (×4): qty 2

## 2015-08-28 MED ORDER — SERTRALINE HCL 100 MG PO TABS
100.0000 mg | ORAL_TABLET | ORAL | Status: DC
  Administered 2015-08-29 – 2015-08-31 (×3): 100 mg via ORAL
  Filled 2015-08-28 (×3): qty 1

## 2015-08-28 MED ORDER — SODIUM CHLORIDE 0.9 % IV SOLN
Freq: Once | INTRAVENOUS | Status: AC
  Administered 2015-08-28: 12:00:00 via INTRAVENOUS

## 2015-08-28 NOTE — Progress Notes (Signed)
Initial Nutrition Assessment  DOCUMENTATION CODES:   Severe malnutrition in context of acute illness/injury  INTERVENTION:  -Monitor intake and cater to pt preferences -Recommend Ensure Enlive po BID, each supplement provides 350 kcal and 20 grams of protein -Discussed with son ways to increase kcals and protein and fluid as concerned about pt getting dehydrated.     NUTRITION DIAGNOSIS:   Malnutrition related to poor appetite, acute illness as evidenced by percent weight loss, energy intake < or equal to 50% for > or equal to 5 days.    GOAL:   Patient will meet greater than or equal to 90% of their needs    MONITOR:   PO intake, Supplement acceptance  REASON FOR ASSESSMENT:   Malnutrition Screening Tool    ASSESSMENT:      Pt admitted with acute cystitis with hematuria, UTI, acute encephalopathy  Past Medical History  Diagnosis Date  . Hypertension   . Hypercholesteremia   . Depression   . Staph infection   . CAP (community acquired pneumonia)     a. 07/2014.  Marland Kitchen. History of TIA (transient ischemic attack)     a. on plavix chronically.  . Cystocele   . Rectocele   . Bladder spasm   . Hyperlipidemia   . Vaginal atrophy   . Bladder instability   . UTI (lower urinary tract infection)    Son reports pt intake has been decreasing over the last 6 months. Son reports pt has trouble with shaking of hands and feels she gets frustrated and does not eat.    Eating well at lunch today.  Son reports this is the second admission pt has had recently with dehydration  Medications reviewed:  Labs reviewed:   Nutrition-Focused physical exam completed. Findings are no fat depletion, mild muscle depletion, and mild edema.     Diet Order:  Diet regular Room service appropriate?: Yes; Fluid consistency:: Thin  Skin:   (noted stage I and II pressure ulcer)  Last BM:  just admitted, PTA  Height:   Ht Readings from Last 1 Encounters:  08/28/15 5' (1.524 m)     Weight: Son reports wt loss of 20 pounds in the last month (14% wt loss in the last month)  Wt Readings from Last 1 Encounters:  08/28/15 128 lb 1.6 oz (58.106 kg)    Ideal Body Weight:     BMI:  Body mass index is 25.02 kg/(m^2).  Estimated Nutritional Needs:   Kcal:  1450-1740 kcals/d  Protein:  70-87 g/d  Fluid:  >/=141050ml/d  EDUCATION NEEDS:   No education needs identified at this time  Camber Ninh B. Freida BusmanAllen, RD, LDN [phone] (pager) Weekend/On-Call pager [phone]

## 2015-08-28 NOTE — ED Provider Notes (Signed)
[facility] Emergency Department Provider Note        Time seen: ----------------------------------------- 9:07 AM on 08/28/2015 -----------------------------------------    I have reviewed the triage vital signs and the nursing notes.   HISTORY  Chief Complaint Weakness    HPI Martha Hunter is a 82 y.o. female who presents to the ER being brought from assisted living with complaint of weakness. Patient was recently released from rehabilitation status post stroke affecting the left side. EMS reports slow speech and left facial droop are baseline. She arrives alert and oriented. Responds she has had trouble urinating however she states that given her medication for her urine and she was recently diagnosed with UTI. She states she does not drink enough water.She denies any pain.   Past Medical History  Diagnosis Date  . Hypertension   . Hypercholesteremia   . Depression   . Staph infection   . CAP (community acquired pneumonia)     a. 07/2014.  Marland Kitchen. History of TIA (transient ischemic attack)     a. on plavix chronically.  . Cystocele   . Rectocele   . Bladder spasm   . Hyperlipidemia   . Vaginal atrophy   . Bladder instability   . UTI (lower urinary tract infection)     Patient Active Problem List   Diagnosis Date Noted  . Acute renal failure (ARF) (HCC) 07/17/2015  . Hypokalemia 07/16/2015  . Metabolic encephalopathy 05/29/2015  . Altered mental status 05/27/2015  . Anxiety 10/14/2014  . Clinical depression 10/14/2014  . Dizziness 10/14/2014  . HLD (hyperlipidemia) 10/14/2014  . BP (high blood pressure) 10/14/2014  . Adiposity 10/14/2014  . Unstable bladder 10/14/2014  . Cystocele 10/14/2014  . Vaginal atrophy 10/14/2014  . CAP (community acquired pneumonia) 07/23/2014  . Chronic kidney disease (CKD), stage III (moderate) 04/07/2014    Past Surgical History  Procedure Laterality Date  . None    . Dilation and curettage of uterus    .  Eye surgery      Allergies Niacin and related; Nitrates, organic; Penicillins; and Sulfa antibiotics  Social History Social History  Substance Use Topics  . Smoking status: Former Games developermoker  . Smokeless tobacco: Not on file  . Alcohol Use: No    Review of Systems Constitutional: Negative for fever. ENT: Positive for dry mouth Cardiovascular: Negative for chest pain. Respiratory: Negative for shortness of breath. Gastrointestinal: Negative for abdominal pain, vomiting and diarrhea. Genitourinary: Positive for dysuria Musculoskeletal: Negative for back pain. Skin: Negative for rash. Neurological: Positive for weakness  10-point ROS otherwise negative.  ____________________________________________   PHYSICAL EXAM:  VITAL SIGNS: ED Triage Vitals  Enc Vitals Group     BP --      Pulse --      Resp --      Temp --      Temp src --      SpO2 --      Weight --      Height --      Head Cir --      Peak Flow --      Pain Score --      Pain Loc --      Pain Edu? --      Excl. in GC? --     Constitutional: Alert and oriented. Generally ill-appearing, in no distress Eyes: Conjunctivae are normal. PERRL. Normal extraocular movements. ENT   Head: Normocephalic and atraumatic.   Nose: No congestion/rhinnorhea.   Mouth/Throat: Mucous membranes  are markedly dry   Neck: No stridor. Cardiovascular: Normal rate, regular rhythm. No murmurs, rubs, or gallops. Respiratory: Normal respiratory effort without tachypnea nor retractions. Breath sounds are clear and equal bilaterally. No wheezes/rales/rhonchi. Gastrointestinal: Soft and nontender. Normal bowel sounds Musculoskeletal: Nontender with normal range of motion in all extremities. No lower extremity tenderness nor edema. Neurologic:  Normal speech and language. Left facial droop, left-sided weakness. Skin:  Skin is warm, dry and intact. No rash noted. Psychiatric: Mood and affect are normal. Speech and behavior  are normal.  ____________________________________________  EKG: Interpreted by me.Sinus rhythm with a rate of 67 bpm, normal PR interval, normal QRS size, normal QT interval. Left anterior fascicular block, LVH  ____________________________________________  ED COURSE:  Pertinent labs & imaging results that were available during my care of the patient were reviewed by me and considered in my medical decision making (see chart for details). Patient presents to ER with weakness, appears dehydrated. She'll receive IV fluids, basic labs and imaging. ____________________________________________    LABS (pertinent positives/negatives)  Labs Reviewed  CBC WITH DIFFERENTIAL/PLATELET - Abnormal; Notable for the following:    RDW 16.1 (*)    Lymphs Abs 0.9 (*)    All other components within normal limits  COMPREHENSIVE METABOLIC PANEL - Abnormal; Notable for the following:    BUN 38 (*)    Creatinine, Ser 1.72 (*)    ALT 8 (*)    GFR calc non Af Amer 26 (*)    GFR calc Af Amer 30 (*)    All other components within normal limits  URINALYSIS COMPLETEWITH MICROSCOPIC ([facility] ONLY) - Abnormal; Notable for the following:    Color, Urine YELLOW (*)    APPearance CLOUDY (*)    Protein, ur 100 (*)    Nitrite POSITIVE (*)    Leukocytes, UA 3+ (*)    Bacteria, UA MANY (*)    All other components within normal limits  URINE CULTURE  TROPONIN I    RADIOLOGY Images were viewed by me  CT head  IMPRESSION: Atrophy with small vessel disease and prior infarcts as 3 noted above. No acute infarct evident. No hemorrhage or mass effect. There are foci of arterial vascular calcification. Evidence of previous cataract surgery on the right. ____________________________________________  FINAL ASSESSMENT AND PLAN  Weakness, dehydration, UTI  Plan: Patient with labs and imaging as dictated above. Patient presents profoundly weak and dehydrated from nursing home. She's been started on saline, will  be given IV Rocephin, urine culture will be sent. I'll discuss with the hospitalist for observation.   Emily Filbert, MD   Note: This dictation was prepared with Dragon dictation. Any transcriptional errors that result from this process are unintentional   Emily Filbert, MD 08/28/15 1122

## 2015-08-28 NOTE — Progress Notes (Signed)
PT Cancellation Note  Patient Details Name: Otis DialsRuth Balbi MRN: [id number] DOB: 01/09/1929   Cancelled Treatment:    Reason Eval/Treat Not Completed: Other (comment) (Consult received and chart reviewed.  Evaluation attempted.  Patient currently with nursing for admission assessment, awaiting dietician.  Unavailable for participation with session at this time.  Will re-attempt at later time/date as appropriate)   Allaya Abbasi H. Manson PasseyBrown, PT, DPT, NCS 08/28/2015, 2:48 PM [phone]

## 2015-08-28 NOTE — H&P (Addendum)
[facility] [place] [facility]   PATIENT NAME: Martha Hunter Frank    MR#:  [id number]  DATE OF BIRTH:  1928-11-28  DATE OF ADMISSION:  08/28/2015  PRIMARY CARE PHYSICIAN: Rozanna BoxBABAOFF, MARC E, MD   REQUESTING/REFERRING PHYSICIAN: Dr. Daryel NovemberJonathan Williams.  CHIEF COMPLAINT:   Chief Complaint  Patient presents with  . Weakness    HISTORY OF PRESENT ILLNESS:  Martha Hunter Miah  is a 82 y.o. [facility] to weakness and trouble getting out of bed. Normally she walks with a walker. As per the son she was [facility] for the past [facility] weeks and [facility] The patient complains of total body weakness. She complains of a sore throat. Some burning on urination. Some hiccups. Patient is a poor historian and some confusion noted. Son is concerned about weight loss and decreased hearing and observation status. Hospitalist services were contacted for urinary tract infection and unable to walk and acute encephalopathy.  PAST MEDICAL HISTORY:   Past Medical History  Diagnosis Date  . Hypertension   . Hypercholesteremia   . Depression   . Staph infection   . CAP (community acquired pneumonia)     a. 07/2014.  Marland Kitchen. History of TIA (transient ischemic attack)     a. on plavix chronically.  . Cystocele   . Rectocele   . Bladder spasm   . Hyperlipidemia   . Vaginal atrophy   . Bladder instability   . UTI (lower urinary tract infection)     PAST SURGICAL HISTORY:   Past Surgical History  Procedure Laterality Date  . None    . Dilation and curettage of uterus    . Eye surgery      SOCIAL HISTORY:   Social History  Substance Use Topics  . Smoking status: Former Games developermoker  . Smokeless tobacco: Not on file  . Alcohol Use: No    FAMILY HISTORY:   Family History  Problem Relation Age of Onset  . CAD      parents and 9 siblings all died with CAD.  Marland Kitchen. Heart  disease Sister   . Heart disease Brother   . Cancer Neg Hx   . Diabetes Neg Hx   . Congestive Heart Failure Mother   . CAD Father     DRUG ALLERGIES:   Allergies  Allergen Reactions  . Niacin And Related   . Nitrates, Organic Other (See Comments)    Reaction: unknown  . Penicillins Other (See Comments)    Has patient had a PCN reaction causing immediate rash, facial/tongue/throat swelling, SOB or lightheadedness with hypotension: unknown  Has patient had a PCN reaction causing severe rash involving mucus membranes or skin necrosis: unknown Has patient had a PCN reaction that required hospitalization: unknown Has patient had a PCN reaction occurring within the last 10 years: unknown If all of the above answers are "NO", then may proceed with Cephalosporin use.  Reactions not listed on MAR  . Sulfa Antibiotics Other (See Comments)    Reaction: unknown     REVIEW OF SYSTEMS:  CONSTITUTIONAL: No fever. Positive for weakness. Positive for weight loss EYES: No blurred or double vision.  EARS, NOSE, AND THROAT: No tinnitus or ear pain. Positive for sore throat. Decreased hearing RESPIRATORY: No cough, shortness of breath, wheezing or hemoptysis.  CARDIOVASCULAR: No chest pain, orthopnea, edema.  GASTROINTESTINAL: No nausea, vomiting, diarrhea or  abdominal pain. No blood in bowel movements GENITOURINARY: Positive for burning on urination. No hematuria.  ENDOCRINE: No polyuria, nocturia,  HEMATOLOGY: No anemia, easy bruising or bleeding SKIN: As per son some skin breakdown on the buttock, patient having some vaginal discomfort MUSCULOSKELETAL: No joint pain or arthritis.   NEUROLOGIC: No tingling, numbness, weakness.  PSYCHIATRY: No anxiety or depression.   MEDICATIONS AT HOME:   Prior to Admission medications   Medication Sig Start Date End Date Taking? Authorizing Provider  acetaminophen (TYLENOL) 500 MG tablet Take 500-1,000 mg by mouth every 4 (four) hours as needed for mild  pain.    Yes Historical Provider, MD  amLODipine (NORVASC) 10 MG tablet Take 1 tablet (10 mg total) by mouth daily. 07/20/15  Yes Ramonita Lab, MD  calcium carbonate (TUMS EX) 750 MG chewable tablet Chew 1 tablet by mouth every 4 (four) hours as needed for heartburn.   Yes Historical Provider, MD  Cholecalciferol (VITAMIN D-3) 1000 UNITS CAPS Take 1,000 Units by mouth daily.    Yes Historical Provider, MD  clopidogrel (PLAVIX) 75 MG tablet Take 75 mg by mouth every morning.    Yes Historical Provider, MD  diazepam (VALIUM) 5 MG tablet Take 5 mg by mouth at bedtime.    Yes Historical Provider, MD  ferrous sulfate 325 (65 FE) MG tablet Take 325 mg by mouth daily.    Yes Historical Provider, MD  guaiFENesin-dextromethorphan (ROBITUSSIN DM) 100-10 MG/5ML syrup Take 5 mLs by mouth every 4 (four) hours as needed for cough. 07/20/15  Yes Ramonita Lab, MD  hydrALAZINE (APRESOLINE) 50 MG tablet Take 1.5 tablets (75 mg total) by mouth every 8 (eight) hours. 07/20/15  Yes Ramonita Lab, MD  loperamide (IMODIUM) 2 MG capsule Take 2 mg by mouth 3 (three) times daily as needed for diarrhea or loose stools.   Yes Historical Provider, MD  losartan (COZAAR) 100 MG tablet Take 100 mg by mouth every morning.    Yes Historical Provider, MD  omeprazole (PRILOSEC) 20 MG capsule Take 20 mg by mouth daily.   Yes Historical Provider, MD  ondansetron (ZOFRAN) 4 MG tablet Take 4 mg by mouth every 8 (eight) hours as needed for nausea or vomiting.   Yes Historical Provider, MD  oxybutynin (DITROPAN) 5 MG tablet Take 5 mg by mouth at bedtime.    Yes Historical Provider, MD  potassium chloride SA (K-DUR,KLOR-CON) 20 MEQ tablet Take 20 mEq by mouth daily.   Yes Historical Provider, MD  pravastatin (PRAVACHOL) 20 MG tablet Take 20 mg by mouth every evening.  11/13/14  Yes Historical Provider, MD  Probiotic Product (PROBIOTIC-10 ULTIMATE PO) Take 1 capsule by mouth daily.   Yes Historical Provider, MD  sertraline (ZOLOFT) 100 MG tablet Take  100 mg by mouth every morning.    Yes Historical Provider, MD      VITAL SIGNS:  Blood pressure 117/56, pulse 65, temperature 97.7 F (36.5 C), temperature source Oral, resp. rate 12, weight 57.698 kg (127 lb 3.2 oz), SpO2 95 %.  PHYSICAL EXAMINATION:  GENERAL:  82 y.o.-year-old patient lying in the bed with no acute distress.  EYES: Pupils equal, round, reactive to light and accommodation. No scleral icterus. Extraocular muscles intact.  HEENT: Head atraumatic, normocephalic. Oropharynx and nasopharynx clear.  NECK:  Supple, no jugular venous distention. No thyroid enlargement, no tenderness.  LUNGS: Normal breath sounds bilaterally, no wheezing, rales,rhonchi or crepitation. No use of accessory muscles of respiration.  CARDIOVASCULAR: S1, S2 normal. No murmurs, rubs, or  gallops.  ABDOMEN: Soft, nontender, nondistended. Bowel sounds present. No organomegaly or mass. Some fullness felt over the bladder. EXTREMITIES: 2+ edema. No cyanosis, or clubbing.  NEUROLOGIC: Cranial nerves II through XII are intact. Muscle strength 5/5 in all extremities. Sensation intact. Gait not checked. Patient able to straight leg raise bilaterally right leg easier to lift up in the left leg PSYCHIATRIC: The patient is alert and answers some questions appropriately and some confusion on other answers.Marland Kitchen  SKIN: No rash seen anteriorly. Unable to turn the patient by myself to look at the backside.  LABORATORY PANEL:   CBC  Recent Labs Lab 08/28/15 0927  WBC 8.0  HGB 12.7  HCT 37.8  PLT 212   ------------------------------------------------------------------------------------------------------------------  Chemistries   Recent Labs Lab 08/28/15 0927  NA 137  K 4.7  CL 108  CO2 22  GLUCOSE 93  BUN 38*  CREATININE 1.72*  CALCIUM 9.8  AST 15  ALT 8*  ALKPHOS 71  BILITOT 0.7    ------------------------------------------------------------------------------------------------------------------  Cardiac Enzymes  Recent Labs Lab 08/28/15 0927  TROPONINI <0.03   ------------------------------------------------------------------------------------------------------------------  RADIOLOGY:  Ct Head Wo Contrast  08/28/2015  CLINICAL DATA:  Altered mental status with chronic dysphasia EXAM: CT HEAD WITHOUT CONTRAST TECHNIQUE: Contiguous axial images were obtained from the base of the skull through the vertex without intravenous contrast. COMPARISON:  July 16, 2015 FINDINGS: Brain: Moderate diffuse atrophy is stable. There is no intracranial mass, hemorrhage, extra-axial fluid collection, or midline shift. There is evidence of a prior infarct in the anterior left lentiform nucleus, stable. There is a prior infarct in the posterior limb of the left external capsule, stable. There is evidence of a prior infarct in the inferior right centrum semiovale extending into the genu of the right internal capsule. There is small vessel disease throughout the centra semiovale bilaterally, stable. No new infarct is evident. Vascular: There is calcification in distal vertebral arteries bilaterally. There is cavernous carotid artery calcification bilaterally. There is no appreciable hyperdense vessel. Skull: The bony calvarium appears intact. Sinuses/Orbits: Patient appears to have had cataract surgery on the right. Orbits otherwise appear symmetric bilaterally. Visualized paranasal sinuses are clear. Other: Mastoid air cells are clear bilaterally. IMPRESSION: Atrophy with small vessel disease and prior infarcts as 3 noted above. No acute infarct evident. No hemorrhage or mass effect. There are foci of arterial vascular calcification. Evidence of previous cataract surgery on the right. Electronically Signed   By: Bretta Bang III M.D.   On: 08/28/2015 10:18    EKG:   Normal sinus rhythm 67  bpm, left anterior fascicular block, left ventricular hypertrophy, poor R-wave progression  IMPRESSION AND PLAN:   1. Acute encephalopathy secondary to acute cystitis 2. Acute cystitis without hematuria. Urine culture sent off by ER physician. Empiric IV Rocephin given and I will continue. Some fullness felt over the bladder area I will have the nursing staff do a bladder scan. 3. Weakness. Patient recently out of rehabilitation. I will get social worker consultation and physical therapy consultation. Patient may end up needing rehabilitation again. I will hold off on further imaging of the brain at this point. 4. Essential hypertension. Continue her usual blood pressure medications 5. History of TIA on Plavix and statin 6. Hyperlipidemia unspecified continue statin  7. Cerumen impaction. Debrox otic solution ordered twice a day 8. Acute kidney injury on chronic kidney disease stage III. Give gentle IV fluid hydration. Recheck creatinine in the morning. 9. GERD on PPI 10. Weight loss. The patient  had a CT scan of the abdomen and pelvis 07/20/2014 which was negative for any cancerous process. CT scan of the chest was done in 2015 again negative for any cancerous process. Further workup can be done as outpatient. 11. Decubitus ulcer present on admission. I was unable to turn patient myself. Will get wound care evaluation.   All the records are reviewed and case discussed with ED provider. Management plans discussed with the patient, family and they are in agreement.  CODE STATUS: DO NOT RESUSCITATE  TOTAL TIME TAKING CARE OF THIS PATIENT: 50  minutes.    Alford Highland M.D on 08/28/2015 at 1:01 PM  Between 7am to 6pm - Pager - [phone]  After 6pm call admission pager [phone]  [facility] Office  [phone]  CC: Primary care physician; BABAOFF, Lavada Mesi, MD

## 2015-08-28 NOTE — Consult Note (Signed)
WOC Nurse wound consult note Reason for Consult:[facility] and now has deep tissue injury to sacrum.  Fissure present on coccyx from pressure and moisture.  Wound type:Pressure injury in conjunction with moisture.  Decline in mobility.  Bilateral boggy heels Pressure Ulcer POA: Yes Measurement: Sacrum 1 cm x 1 cm maroon intact skin 0.2 cm fissure to coccyx Bilateral boggy heels with blanchable erythema.  Wound ZOX:WRUEAVbed:intact Drainage (amount, consistency, odor) None Periwound:Intact Dressing procedure/placement/frequency: Cleanse sacrum and coccyx with soap and water.  Silicone border foam dressing.  Change every 3 days and PRN soilage.  Avoid disposable briefs and underpads to improve skin microclimate with Dermatherapy.  Silicone border foam to bilateral heels.  Turn and reposition every two hours.  Will not follow at this time.  Please re-consult if needed.  Maple HudsonKaren Prophet Renwick RN BSN CWON Pager [phone]

## 2015-08-28 NOTE — ED Notes (Signed)
[facility] with complaint of weakness. Onset today. Patient was recently released from rehab s/p stroke left side affected. EMS reports slow speech and left droop are baseline. However patient is A+O x4. Patient states that she was having trouble urinating however also states "they gave me medication for my urine". When asked if she was diagnosed with a UTI, patient states "yes"

## 2015-08-29 DIAGNOSIS — G9349 Other encephalopathy: Secondary | ICD-10-CM | POA: Diagnosis not present

## 2015-08-29 LAB — CBC
HCT: 37.9 % (ref 35.0–47.0)
Hemoglobin: 12.3 g/dL (ref 12.0–16.0)
MCH: 27.4 pg (ref 26.0–34.0)
MCHC: 32.6 g/dL (ref 32.0–36.0)
MCV: 84.3 fL (ref 80.0–100.0)
PLATELETS: 195 10*3/uL (ref 150–440)
RBC: 4.49 MIL/uL (ref 3.80–5.20)
RDW: 16.4 % — ABNORMAL HIGH (ref 11.5–14.5)
WBC: 8.1 10*3/uL (ref 3.6–11.0)

## 2015-08-29 LAB — BASIC METABOLIC PANEL
Anion gap: 6 (ref 5–15)
BUN: 31 mg/dL — ABNORMAL HIGH (ref 6–20)
CHLORIDE: 113 mmol/L — AB (ref 101–111)
CO2: 20 mmol/L — ABNORMAL LOW (ref 22–32)
CREATININE: 1.4 mg/dL — AB (ref 0.44–1.00)
Calcium: 8.9 mg/dL (ref 8.9–10.3)
GFR, EST AFRICAN AMERICAN: 39 mL/min — AB (ref >60)
GFR, EST NON AFRICAN AMERICAN: 33 mL/min — AB (ref >60)
Glucose, Bld: 96 mg/dL (ref 65–99)
Potassium: 4.4 mmol/L (ref 3.5–5.1)
SODIUM: 139 mmol/L (ref 135–145)

## 2015-08-29 MED ORDER — HYDRALAZINE HCL 50 MG PO TABS
50.0000 mg | ORAL_TABLET | Freq: Three times a day (TID) | ORAL | Status: DC
Start: 2015-08-29 — End: 2015-08-31
  Administered 2015-08-29 – 2015-08-31 (×5): 50 mg via ORAL
  Filled 2015-08-29 (×5): qty 1

## 2015-08-29 NOTE — Evaluation (Signed)
Physical Therapy Evaluation Patient Details Name: Martha Hunter MRN: [id number] DOB: 1928-10-24 Today's Date: 08/29/2015   History of Present Illness  Martha Hunter is an 82yo white female who comes to [facility] from [facility] after acute progressive weakness and decline in mobility. The patient had just returned to [facility] after 5-6 weeks of Rehab at [facility] She was here in the beginning of June for hypokalemia, dehydration, and AMS, also with difficulty walking. Daughter was arriving upon my exit, who is not entirely sure of progress at rehab but reports that the patient used a WC for most mobility, AMB during PT sessions only.   Clinical Impression  Upon entry, the patient is received semirecumbent in bed asleep, no family/caregiver present. The pt is awakened easily and agreeable to participate. No acute distress noted at this time. VSS during eval. The pt somewhat slow to respond and with flat affect, but pleasant and following simple commands consistently. Functional mobility assessment demonstrates moderate-severe weakness in 4 limbs and trunk, the pt now requiring Mod-Max-assist physical assistance for bed mobility, and limited ability to perform transfers. Gait in not able to be performed in spite of attempts. The patient is reported to have performed all of these with greater independence at baseline. Seated balance is adequate statically, but dynamically is quite limited; she unable to obtain balance in stance despite multiple attempts, and does not use RW in a productive way to assist with balance.   Patient presenting with impairment of strength, AMS, balance, and activity tolerance, limiting ability to perform ADL and mobility tasks at  baseline level of function. Patient will benefit from skilled intervention to address the above impairments and limitations, in order to restore to prior level of function, improve patient safety upon discharge, and to decrease falls risk.        Follow Up Recommendations Supervision/Assistance - 24 hour;Supervision - Intermittent;Supervision for mobility/OOB (Return to ALF if they are able to provide physical assistance; otherwise will require a higher level of assistance. )    Equipment Recommendations   (May require a WC for mobility; unclear if patient already has one. )    Recommendations for Other Services       Precautions / Restrictions Precautions Precautions: None      Mobility  Bed Mobility Overal bed mobility: +2 for physical assistance;Needs Assistance Bed Mobility: Supine to Sit;Sit to Supine     Supine to sit: Max assist Sit to supine: Mod assist   General bed mobility comments: requires very cues and encouragement, it is unclear if patient gets distracted or fatigued, but she stops midway through 2nd attempt and lies down. She also requires multiple VC to inititate mobility for no clear reason.   Transfers Overall transfer level: Needs assistance Equipment used: Rolling walker (2 wheeled) Transfers: Sit to/from Stand Sit to Stand: Min guard         General transfer comment: performed 5x; progressively getts more fluid and easy, but patient never comes to full stainding, maintaining thighs on EOB; her tolerance to standing is limited. Some retropulsion noted.   Ambulation/Gait Ambulation/Gait assistance:  (unabel to take steps when cued in spite of attempts; able to take very small L side steps. )              Stairs            Wheelchair Mobility    Modified Rankin (Stroke Patients Only)       Balance   Sitting-balance support: Feet supported Sitting  balance-Leahy Scale: Good       Standing balance-Leahy Scale: Poor                               Pertinent Vitals/Pain Pain Assessment: No/denies pain    Home Living Family/patient expects to be discharged to:: Skilled nursing facility                      Prior Function Level of Independence: Needs  assistance         Comments: Family reports pt needed Min-MaxA for dressing and bathing, and she has a rollator, but walks minimally at the facility.      Hand Dominance        Extremity/Trunk Assessment   Upper Extremity Assessment: Generalized weakness           Lower Extremity Assessment: Generalized weakness      Cervical / Trunk Assessment:  (very weak, difficulty coming to sitting from supine. )  Communication      Cognition Arousal/Alertness: Lethargic Behavior During Therapy: Flat affect Overall Cognitive Status: Impaired/Different from baseline Area of Impairment: Problem solving;Attention;Memory               General Comments: Pt requires additional time to find bed rails to self assist pulling up. Details regarding recent moves from ALF to rehab are unclear as reported by patient.     General Comments      Exercises        Assessment/Plan    PT Assessment Patient needs continued PT services  PT Diagnosis Difficulty walking;Generalized weakness;Altered mental status   PT Problem List Decreased strength;Decreased activity tolerance;Decreased balance;Decreased mobility;Decreased knowledge of use of DME  PT Treatment Interventions DME instruction;Gait training;Patient/family education;Functional mobility training;Therapeutic activities;Therapeutic exercise;Balance training   PT Goals (Current goals can be found in the Care Plan section) Acute Rehab PT Goals PT Goal Formulation: Patient unable to participate in goal setting    Frequency Min 2X/week   Barriers to discharge   Unclear how much physical assistance can be provided at ALF; may require a higher level of care.     Co-evaluation               End of Session Equipment Utilized During Treatment: Gait belt Activity Tolerance: Patient tolerated treatment well;No increased pain;Patient limited by fatigue;Patient limited by lethargy Patient left: in bed;with family/visitor  present;with call bell/phone within reach;with bed alarm set Nurse Communication: Other (comment) (pt reports she is urinating. )    Functional Assessment Tool Used: Clinical Judgment  Functional Limitation: Mobility: Walking and moving around Mobility: Walking and Moving Around Current Status (Z6109): At least 80 percent but less than 100 percent impaired, limited or restricted Mobility: Walking and Moving Around Goal Status [phone]): At least 40 percent but less than 60 percent impaired, limited or restricted    Time: 0981-1914 PT Time Calculation (min) (ACUTE ONLY): 24 min   Charges:   PT Evaluation $PT Eval Moderate Complexity: 1 Procedure PT Treatments $Therapeutic Activity: 8-22 mins   PT G Codes:   PT G-Codes **NOT FOR INPATIENT CLASS** Functional Assessment Tool Used: Clinical Judgment  Functional Limitation: Mobility: Walking and moving around Mobility: Walking and Moving Around Current Status (N8295): At least 80 percent but less than 100 percent impaired, limited or restricted Mobility: Walking and Moving Around Goal Status [phone]): At least 40 percent but less than 60 percent impaired, limited or restricted  11:47 AM, 08/29/2015 Rosamaria LintsAllan C Ladana Chavero, PT, DPT Physical Therapist - [place] [phone](ASCOM)  [phone] (mobile)

## 2015-08-29 NOTE — NC FL2 (Addendum)
[place] MEDICAID FL2 LEVEL OF CARE SCREENING TOOL     IDENTIFICATION  Patient Name: Martha Hunter Birthdate: 05-30-1928 Sex: female Admission Date (Current Location): 08/28/2015  [place] and [place] Number:  Chiropodist and Address:  [facility], [address] [place], [place] [address]      Provider Number: [id number]  Attending Physician Name and Address:  Martha Pollack, MD  Relative Name and Phone Number:       Current Level of Care: Hospital Recommended Level of Care: Assisted Living Facility Prior Approval Number:    Date Approved/Denied:   PASRR Number:   [id number]  Discharge Plan: Other (Comment) Martha Monks)    Current Diagnoses: Patient Active Problem List   Diagnosis Date Noted  . Acute encephalopathy 08/28/2015  . Pressure ulcer 08/28/2015  . Acute renal failure (ARF) (HCC) 07/17/2015  . Hypokalemia 07/16/2015  . Metabolic encephalopathy 05/29/2015  . Altered mental status 05/27/2015  . Anxiety 10/14/2014  . Clinical depression 10/14/2014  . Dizziness 10/14/2014  . HLD (hyperlipidemia) 10/14/2014  . BP (high blood pressure) 10/14/2014  . Adiposity 10/14/2014  . Unstable bladder 10/14/2014  . Cystocele 10/14/2014  . Vaginal atrophy 10/14/2014  . CAP (community acquired pneumonia) 07/23/2014  . Chronic kidney disease (CKD), stage III (moderate) 04/07/2014    Orientation RESPIRATION BLADDER Height & Weight     Self, Time, Situation, Place  Normal Incontinent Weight: 128 lb 1.6 oz (58.106 kg) Height:  5' (152.4 cm)  BEHAVIORAL SYMPTOMS/MOOD NEUROLOGICAL BOWEL NUTRITION STATUS      Incontinent Diet (Normal)  AMBULATORY STATUS COMMUNICATION OF NEEDS Skin   Supervision Verbally Normal                       Personal Care Assistance Level of Assistance  Bathing, Feeding, Dressing, Total care Bathing Assistance: Limited assistance Feeding assistance: Independent Dressing Assistance: Limited assistance Total  Care Assistance: Limited assistance   Functional Limitations Info  Sight, Hearing, Speech Sight Info: Adequate Hearing: Hard of hearing Speech Info: Adequate    SPECIAL CARE FACTORS FREQUENCY   PT x3 week  Pt 3x week                  Contractures Contractures Info: Not present    Additional Factors Info  Code Status, Allergies Code Status Info: DNR Allergies Info: Niacin and related Nitrates, organic penicillan, sulpha antibiotics           Current Medications (08/29/2015):  This is the current hospital active medication list Current Facility-Administered Medications  Medication Dose Route Frequency Provider Last Rate Last Dose  . 0.9 %  sodium chloride infusion   Intravenous Continuous Martha Highland, MD 50 mL/hr at 08/28/15 1514    . acetaminophen (TYLENOL) tablet 650 mg  650 mg Oral Q6H PRN Martha Highland, MD       Or  . acetaminophen (TYLENOL) suppository 650 mg  650 mg Rectal Q6H PRN Martha Highland, MD      . acidophilus (RISAQUAD) capsule 1 capsule  1 capsule Oral Daily Martha Highland, MD      . amLODipine (NORVASC) tablet 10 mg  10 mg Oral Daily Martha Highland, MD      . calcium carbonate (TUMS - dosed in mg elemental calcium) chewable tablet 200 mg of elemental calcium  1 tablet Oral Q4H PRN Martha Highland, MD      . carbamide peroxide (DEBROX) 6.5 % otic solution 5 drop  5 drop Both Ears BID  Martha Highlandichard Wieting, MD   5 drop at 08/28/15 2157  . cefTRIAXone (ROCEPHIN) 1 g in dextrose 5 % 50 mL IVPB  1 g Intravenous Q24H Martha Highlandichard Wieting, MD      . cholecalciferol (VITAMIN D) tablet 1,000 Units  1,000 Units Oral Daily Martha Highlandichard Wieting, MD   1,000 Units at 08/28/15 1514  . clopidogrel (PLAVIX) tablet 75 mg  75 mg Oral Martha Hunter Martha Wieting, MD   75 mg at 08/29/15 0618  . diazepam (VALIUM) tablet 5 mg  5 mg Oral QHS Martha Highlandichard Wieting, MD   5 mg at 08/28/15 2158  . enoxaparin (LOVENOX) injection 30 mg  30 mg Subcutaneous Q24H Martha Highlandichard Wieting, MD   30 mg at 08/28/15 1515   . feeding supplement (ENSURE ENLIVE) (ENSURE ENLIVE) liquid 237 mL  237 mL Oral BID BM Martha Highlandichard Wieting, MD   237 mL at 08/28/15 1723  . ferrous sulfate tablet 325 mg  325 mg Oral Daily Martha Highlandichard Wieting, MD      . hydrALAZINE (APRESOLINE) tablet 75 mg  75 mg Oral Q8H Martha Highlandichard Wieting, MD   75 mg at 08/29/15 0618  . loperamide (IMODIUM) capsule 2 mg  2 mg Oral TID PRN Martha Highlandichard Wieting, MD      . losartan (COZAAR) tablet 100 mg  100 mg Oral Martha Hunter Martha Wieting, MD   100 mg at 08/29/15 0617  . ondansetron (ZOFRAN) tablet 4 mg  4 mg Oral Q8H PRN Martha Highlandichard Wieting, MD      . pantoprazole (PROTONIX) EC tablet 40 mg  40 mg Oral Daily Martha Highlandichard Wieting, MD   40 mg at 08/28/15 1514  . potassium chloride SA (K-DUR,KLOR-CON) CR tablet 20 mEq  20 mEq Oral Daily Martha Highlandichard Wieting, MD      . pravastatin (PRAVACHOL) tablet 20 mg  20 mg Oral QPM Martha Highlandichard Wieting, MD   20 mg at 08/28/15 1723  . sertraline (ZOLOFT) tablet 100 mg  100 mg Oral Martha Hunter Martha Wieting, MD   100 mg at 08/29/15 [id number]     Discharge Medications: Please see discharge summary for a list of discharge medications.  Relevant Imaging Results:  Relevant Lab Results:   Additional Information SSN [id number]  Martha Hunter, Martha Hunter, [place]

## 2015-08-29 NOTE — Progress Notes (Addendum)
[facility] - [place] at [facility]   PATIENT NAME: Martha Hunter    MR#:  [id number]  DATE OF BIRTH:  1928/05/26  SUBJECTIVE:  CHIEF COMPLAINT:   Chief Complaint  Patient presents with  . Weakness   Generalized weakness, pt is more alert. REVIEW OF SYSTEMS:  Review of Systems  Constitutional: Positive for weight loss and malaise/fatigue. Negative for fever and chills.  Respiratory: Negative for cough, shortness of breath and wheezing.   Cardiovascular: Negative for chest pain, palpitations and leg swelling.  Gastrointestinal: Negative for nausea, vomiting and abdominal pain.  Genitourinary: Negative for dysuria, urgency and frequency.  Neurological: Positive for weakness. Negative for dizziness, speech change, focal weakness and headaches.  Psychiatric/Behavioral: Negative for depression. The patient is not nervous/anxious.     DRUG ALLERGIES:   Allergies  Allergen Reactions  . Niacin And Related   . Nitrates, Organic Other (See Comments)    Reaction: unknown  . Penicillins Other (See Comments)    Has patient had a PCN reaction causing immediate rash, facial/tongue/throat swelling, SOB or lightheadedness with hypotension: unknown  Has patient had a PCN reaction causing severe rash involving mucus membranes or skin necrosis: unknown Has patient had a PCN reaction that required hospitalization: unknown Has patient had a PCN reaction occurring within the last 10 years: unknown If all of the above answers are "NO", then may proceed with Cephalosporin use.  Reactions not listed on MAR  . Sulfa Antibiotics Other (See Comments)    Reaction: unknown    VITALS:  Blood pressure 116/43, pulse 71, temperature 98.1 F (36.7 C), temperature source Oral, resp. rate 20, height 5' (1.524 m), weight 128 lb 1.6 oz (58.106 kg), SpO2 97 %. PHYSICAL EXAMINATION:  Physical Exam  Constitutional: She is oriented to person, place, and time.  HENT:  Head: Normocephalic.    Eyes: Conjunctivae and EOM are normal. Pupils are equal, round, and reactive to light.  Neck: Neck supple. No thyromegaly present.  Cardiovascular: Normal rate, regular rhythm and intact distal pulses.  Exam reveals no gallop and no friction rub.   No murmur heard. Pulmonary/Chest: Effort normal and breath sounds normal. No respiratory distress. She has no wheezes. She has no rales. She exhibits no tenderness.  Abdominal: She exhibits no distension. There is no tenderness. There is no rebound and no guarding.  Musculoskeletal: She exhibits no edema or tenderness.  Neurological: She is alert and oriented to person, place, and time. No cranial nerve deficit.  Skin: Skin is warm and dry. No rash noted. No erythema. No pallor.  Psychiatric: Affect normal.   LABORATORY PANEL:   CBC  Recent Labs Lab 08/29/15 0638  WBC 8.1  HGB 12.3  HCT 37.9  PLT 195   ------------------------------------------------------------------------------------------------------------------ Chemistries   Recent Labs Lab 08/28/15 0927 08/29/15 0638  NA 137 139  K 4.7 4.4  CL 108 113*  CO2 22 20*  GLUCOSE 93 96  BUN 38* 31*  CREATININE 1.72* 1.40*  CALCIUM 9.8 8.9  AST 15  --   ALT 8*  --   ALKPHOS 71  --   BILITOT 0.7  --    RADIOLOGY:  No results found. ASSESSMENT AND PLAN:   1. Acute encephalopathy secondary to acute cystitis. Improved to her baseline.  2. Acute cystitis without hematuria.  continue IV Rocephin, f/u Urine culture   3. Weakness.  F/u  physical therapy and social worker consultation.  4. Essential hypertension. Continue her usual blood pressure medications 5. History  of TIA on Plavix and statin 6. Hyperlipidemia unspecified continue statin  7. Cerumen impaction. Debrox otic solution ordered twice a day 8. Acute kidney injury on chronic kidney disease stage III.  Improving with gentle IV fluid hydration. Recheck creatinine in the morning.  9. GERD on PPI 10. Weight  loss. The patient had a CT scan of the abdomen and pelvis 07/20/2014 which was negative for any cancerous process. CT scan of the chest was done in 2015 again negative for any cancerous process. Further workup can be done as outpatient.  11. Decubitus ulcer present on admission. wound care evaluation.  All the records are reviewed and case discussed with Care Management/Social Worker. Management plans discussed with the patient, son and they are in agreement.  CODE STATUS: DNR.  TOTAL TIME TAKING CARE OF THIS PATIENT: 45 minutes.   More than 50% of the time was spent in counseling/coordination of care: YES  POSSIBLE D/C IN 2 DAYS, DEPENDING ON CLINICAL CONDITION.   Shaune Pollackhen, Ahlaya Ende M.D on 08/29/2015 at 12:38 PM  Between 7am to 6pm - Pager - [phone]  After 6pm go to [email or web address] - Social research officer, governmentpassword [email or web address]  [facility] [place] [facility]  Office  [phone]  CC: Primary care physician; BABAOFF, Lavada MesiMARC E, MD  Note: This dictation was prepared with Dragon dictation along with smaller phrase technology. Any transcriptional errors that result from this process are unintentional.

## 2015-08-29 NOTE — Care Management Obs Status (Signed)
MEDICARE OBSERVATION STATUS NOTIFICATION   Patient Details  Name: Martha Hunter MRN: [id number] Date of Birth: [birth year]   Medicare Observation Status Notification Given:  Yes (On Contact Isolation)  Martha Hunter is on Contact isolation and there is no family at bedside to sign. Requested that unit secretary call this Clinical research associatewriter if family presents to [facility]. Son is attending a funeral.     Martha Hunter,Aviance Cooperwood A, RN 08/29/2015, 3:17 PM

## 2015-08-29 NOTE — Clinical Social Work Note (Addendum)
Clinical Social Work Assessment  Patient Details  Name: Martha Hunter MRN: [id number] Date of Birth: March 20, 1928  Date of referral:  08/29/15               Reason for consult:  Facility Placement                Permission sought to share information with:  Facility Sport and exercise psychologist Permission granted to share information::  Yes, Verbal Permission Granted  Name::     Martha Hunter [phone] Martha Hunter [phone] HCPOA  Agency::  yes  Relationship::  yes  Contact Information:  Martha Hunter [phone] Martha Hunter [phone] HCPOA  Housing/Transportation Living arrangements for the past 2 months:  [place] of Information:  Facility Patient Interpreter Needed:  None Criminal Activity/Legal Involvement Pertinent to Current Situation/Hospitalization:  No - Comment as needed Significant Relationships:  Adult Children Lives with:  Facility Resident Do you feel safe going back to the place where you live?  Yes Need for family participation in patient care:  Yes (Comment)  Care giving concerns: LCSW met with patients son who was upset that his mother was put in observation vs being admitted. He understands she does not meet medicare criteria. LCSW listened attentively while patient vented. LCSW reviewed to reapply and appeal process for DSS-Medicaid as his mother has 4$ over their criteria.    Social Worker assessment / plan:  LCSW met with patient and her son HCPOA and daughter in-law. Patient was oriented x4 but hard of hearing and speaking slowly. obtained verbal consent to speak to [employer] ( [facility] [place]). Her son Martha Hunter is her HCPOA.Patient has PT set up to come to ALF 3x week and son will now re-instate sitters to assist his mother Patient is able to walk very slowly with walker but looses her balance and needs assistance with showering and dressing. She is able to feed herself.Patients has lost 20 lbs in the last month and her appetite  is poor. Patient is private Pay and is able to return to [facility]. She does not use 02 and has a regular diet. Patients insurance is Agricultural engineer. Patient has excellent family support son and daughter very involved in this patients care.  Employment status:  Retired Forensic scientist:  [employer] (Medicaid Pending) (Private pay [employer]) PT Recommendations:  Not assessed at this time Information / Referral to community resources:   None required.  Patient/Family's Response to care:  TBD  Patient/Family's Understanding of and Emotional Response to Diagnosis, Current Treatment, and Prognosis: Good understanding  Emotional Assessment Appearance:  Appears stated age Attitude/Demeanor/Rapport:   (polite,calm) Affect (typically observed):  Calm, Accepting Orientation:  Oriented to Self, Oriented to Place, Oriented to  Time, Oriented to Situation Alcohol / Substance use:  Never Used Psych involvement (Current and /or in the community):  No (Comment)  Discharge Needs  Concerns to be addressed:  No discharge needs identified Readmission within the last 30 days:  No Current discharge risk:  None Barriers to Discharge:  No Barriers Identified   [name], LCSW 08/29/2015, 9:25 AM

## 2015-08-30 DIAGNOSIS — G9349 Other encephalopathy: Secondary | ICD-10-CM | POA: Diagnosis not present

## 2015-08-30 LAB — URINE CULTURE: Special Requests: NORMAL

## 2015-08-30 LAB — BASIC METABOLIC PANEL
Anion gap: 7 (ref 5–15)
BUN: 20 mg/dL (ref 6–20)
CALCIUM: 9.1 mg/dL (ref 8.9–10.3)
CO2: 20 mmol/L — AB (ref 22–32)
CREATININE: 1.17 mg/dL — AB (ref 0.44–1.00)
Chloride: 112 mmol/L — ABNORMAL HIGH (ref 101–111)
GFR calc Af Amer: 48 mL/min — ABNORMAL LOW (ref >60)
GFR calc non Af Amer: 42 mL/min — ABNORMAL LOW (ref >60)
GLUCOSE: 97 mg/dL (ref 65–99)
Potassium: 3.9 mmol/L (ref 3.5–5.1)
Sodium: 139 mmol/L (ref 135–145)

## 2015-08-30 MED ORDER — FERROUS SULFATE 325 (65 FE) MG PO TABS: 325.0000 mg | ORAL_TABLET | Freq: Every day | ORAL | Status: DC

## 2015-08-30 MED ORDER — DOXYCYCLINE HYCLATE 100 MG PO TABS
100.0000 mg | ORAL_TABLET | Freq: Two times a day (BID) | ORAL | Status: DC
  Filled 2015-08-30: qty 1

## 2015-08-30 MED ORDER — DOXYCYCLINE HYCLATE 100 MG PO TABS
100.0000 mg | ORAL_TABLET | Freq: Two times a day (BID) | ORAL | Status: DC
  Administered 2015-08-30 – 2015-08-31 (×2): 100 mg via ORAL
  Filled 2015-08-30 (×2): qty 1

## 2015-08-30 MED ORDER — SODIUM CHLORIDE 0.9% FLUSH
3.0000 mL | INTRAVENOUS | Status: DC | PRN
  Administered 2015-08-30: 11:00:00 3 mL via INTRAVENOUS
  Filled 2015-08-30: qty 3

## 2015-08-30 MED ORDER — TETRACYCLINE HCL 250 MG PO CAPS: 250.0000 mg | ORAL_CAPSULE | Freq: Four times a day (QID) | ORAL | Status: DC

## 2015-08-30 MED ORDER — SODIUM CHLORIDE 0.9% FLUSH
3.0000 mL | Freq: Two times a day (BID) | INTRAVENOUS | Status: DC
  Administered 2015-08-30 – 2015-08-31 (×3): 3 mL via INTRAVENOUS

## 2015-08-30 NOTE — Care Management Note (Signed)
Case Management Note  Patient Details  Name: Martha DialsRuth Hunter MRN: [id number] Date of Birth: [birth year]  Subjective/Objective:       Discussed discharge planning with Dr Imogene Burnhen. [facility] or to SNF. Possible discharge on Monday per Dr Imogene Burnhen.   Action/Plan:   Expected Discharge Date:                  Expected Discharge Plan:     In-House Referral:     Discharge planning Services     Post Acute Care Choice:    Choice offered to:     DME Arranged:    DME Agency:     HH Arranged:    HH Agency:     Status of Service:     If discussed at [employer] Length of Stay Meetings, dates discussed:    Additional Comments:  Lariyah Shetterly A, RN 08/30/2015, 10:59 AM

## 2015-08-30 NOTE — Progress Notes (Signed)
LCSW called and talked to patients daughter. She just wanted to find out about patients discharge plan. LCSW stated that as per her brother [facility] to assist with meals, [facility] in- patient PT support  3x week. No further needs at this time   Delta Air LinesClaudine Ketsia Linebaugh LCSW [phone]

## 2015-08-30 NOTE — Progress Notes (Signed)
[facility] [place] [facility]   PATIENT NAME: Martha Hunter    MR#:  [id number]  DATE OF BIRTH:  Dec 30, 1928  SUBJECTIVE:  CHIEF COMPLAINT:   Chief Complaint  Patient presents with  . Weakness   Generalized weakness and poor oral intake. REVIEW OF SYSTEMS:  Review of Systems  Constitutional: Positive for weight loss and malaise/fatigue. Negative for fever and chills.  Respiratory: Negative for cough, shortness of breath and wheezing.   Cardiovascular: Negative for chest pain, palpitations and leg swelling.  Gastrointestinal: Negative for nausea, vomiting and abdominal pain.  Genitourinary: Negative for dysuria, urgency and frequency.  Neurological: Positive for weakness. Negative for dizziness, speech change, focal weakness and headaches.  Psychiatric/Behavioral: Negative for depression. The patient is not nervous/anxious.     DRUG ALLERGIES:   Allergies  Allergen Reactions  . Niacin And Related   . Nitrates, Organic Other (See Comments)    Reaction: unknown  . Penicillins Other (See Comments)    Has patient had a PCN reaction causing immediate rash, facial/tongue/throat swelling, SOB or lightheadedness with hypotension: unknown  Has patient had a PCN reaction causing severe rash involving mucus membranes or skin necrosis: unknown Has patient had a PCN reaction that required hospitalization: unknown Has patient had a PCN reaction occurring within the last 10 years: unknown If all of the above answers are "NO", then may proceed with Cephalosporin use.  Reactions not listed on MAR  . Sulfa Antibiotics Other (See Comments)    Reaction: unknown    VITALS:  Blood pressure 146/58, pulse 65, temperature 97.8 F (36.6 C), temperature source Oral, resp. rate 20, height 5' (1.524 m), weight 129 lb 3.2 oz (58.605 kg), SpO2 97 %. PHYSICAL EXAMINATION:  Physical Exam  Constitutional: She is oriented to person, place, and time. malnutrition. HENT:  Head:  Normocephalic.  Eyes: Conjunctivae and EOM are normal. Pupils are equal, round, and reactive to light.  Neck: Neck supple. No thyromegaly present.  Cardiovascular: Normal rate, regular rhythm and intact distal pulses.  Exam reveals no gallop and no friction rub.   No murmur heard. Pulmonary/Chest: Effort normal and breath sounds normal. No respiratory distress. She has no wheezes. She has no rales. She exhibits no tenderness.  Abdominal: She exhibits no distension. There is no tenderness. There is no rebound and no guarding.  Musculoskeletal: She exhibits no edema or tenderness.  Neurological: She is alert and oriented to person, place, and time. No cranial nerve deficit. power 3-4 /5 in all extremities. Skin: Skin is warm and dry. No rash noted. No erythema. No pallor.  Psychiatric: Affect normal.   LABORATORY PANEL:   CBC  Recent Labs Lab 08/29/15 0638  WBC 8.1  HGB 12.3  HCT 37.9  PLT 195   ------------------------------------------------------------------------------------------------------------------ Chemistries   Recent Labs Lab 08/28/15 0927  08/30/15 0547  NA 137  < > 139  K 4.7  < > 3.9  CL 108  < > 112*  CO2 22  < > 20*  GLUCOSE 93  < > 97  BUN 38*  < > 20  CREATININE 1.72*  < > 1.17*  CALCIUM 9.8  < > 9.1  AST 15  --   --   ALT 8*  --   --   ALKPHOS 71  --   --   BILITOT 0.7  --   --   < > = values in this interval not displayed. RADIOLOGY:  No results found. ASSESSMENT AND PLAN:  1. Acute encephalopathy secondary to acute cystitis. Improved to her baseline.  2. Acute cystitis without hematuria.  discontinue IV Rocephin. Urine culture: MRSA. Start doxycycline.   3. Weakness.  Per physical therapy, Supervision/Assistance - 24 hour;Supervision - Intermittent;Supervision for mobility/OOB (Return to ALF if they are able to provide physical assistance; otherwise will require a higher level of assistance.  PT reevaluation. It is not safe to discharge to  ALF. I discussed with SW and CM.  4. Essential hypertension. Continue her usual blood pressure medications 5. History of TIA on Plavix and statin 6. Hyperlipidemia unspecified continue statin  7. Cerumen impaction. Debrox otic solution ordered twice a day 8. Acute kidney injury on chronic kidney disease stage III.  Improved with gentle IV fluid hydration.  9. GERD on PPI 10. Weight loss. The patient had a CT scan of the abdomen and pelvis 07/20/2014 which was negative for any cancerous process. CT scan of the chest was done in 2015 again negative for any cancerous process. Further workup can be done as outpatient.  Severe malnutrition. Encourage oral intake, f/u dietitian.  11. Decubitus ulcer present on admission. wound care evaluation.  All the records are reviewed and case discussed with Care Management/Social Worker. Management plans discussed with the patient, her daughter and they are in agreement.  CODE STATUS: DNR.  TOTAL TIME TAKING CARE OF THIS PATIENT: 33 minutes.   More than 50% of the time was spent in counseling/coordination of care: YES  POSSIBLE D/C IN 1-2 DAYS, DEPENDING ON CLINICAL CONDITION.   Shaune Pollackhen, Martha Hunter M.D on 08/30/2015 at 11:35 AM  Between 7am to 6pm - Pager - [phone]  After 6pm go to [email or web address] - Social research officer, governmentpassword EPAS [facility]  [facility] [place] [facility]  Office  [phone]  CC: Primary care physician; BABAOFF, Lavada MesiMARC E, MD  Note: This dictation was prepared with Dragon dictation along with smaller phrase technology. Any transcriptional errors that result from this process are unintentional.

## 2015-08-30 NOTE — Progress Notes (Signed)
Pt more alert, eating better today. Denies co's. Temp 99.4. Rocephin IV discontinued and to start on po antibiotics. Noted pt passes with swallow almost like gag then coughs at intervals. Son, dgt and granddgt in to visit pt today. PT in and worked with pt with recommendation for pt to return to rehab.

## 2015-08-30 NOTE — Progress Notes (Signed)
Physical Therapy Treatment Patient Details Name: Martha Hunter MRN: [id number] DOB: September 10, 1928 Today's Date: 08/30/2015    History of Present Illness Martha Hunter is an 82yo white female who comes to [facility] from [facility] after acute progressive weakness and decline in mobility. The patient had just returned to [facility] after 5-6 weeks of Rehab at [facility] She was here in the beginning of June for hypokalemia, dehydration, and AMS, also with difficulty walking. Daughter was arriving upon my exit, who is not entirely sure of progress at rehab but reports that the patient used a WC for most mobility, AMB during PT sessions only.     PT Comments    Pt is able to do some limited ambulation but is not to the point where she could safely go back to ALF unless they were able to give a high level of assist.  She did show good effort with the session and pt did want to stay sitting up but she had a bowel movement and needed to get back to bed for a change.    Follow Up Recommendations  SNF (no safe to return to ALF at this time )     Equipment Recommendations       Recommendations for Other Services       Precautions / Restrictions Precautions Precautions: Fall Restrictions Weight Bearing Restrictions: No    Mobility  Bed Mobility Overal bed mobility: Needs Assistance Bed Mobility: Supine to Sit;Sit to Supine     Supine to sit: Mod assist Sit to supine: Max assist   General bed mobility comments: Pt shows good effort in getting to EOB and though she does need considerable assist to get to upright she is able to slide LEs and use UEs to assist with much of the transition to sitting.  Pt needs heavy assist to get back to bed.  Transfers Overall transfer level: Needs assistance Equipment used: Rolling walker (2 wheeled) Transfers: Sit to/from Stand Sit to Stand: Min assist;Min guard         General transfer comment: Pt is able to use UEs well to push up to standing, needs  assist to get weight forward and fully to upright.  3 total stand attempts and pt does get better with cuing.   Ambulation/Gait Ambulation/Gait assistance: Mod assist Ambulation Distance (Feet): 5 Feet Assistive device: Rolling walker (2 wheeled)       General Gait Details: Pt is weak and limited with ambulation but ultimately is able to take a few steps w/o LOBs.  She is heavy reliant on the walker and does need occasional assist to maintain balance (pt leaning backward much of the time)   Stairs            Wheelchair Mobility    Modified Rankin (Stroke Patients Only)       Balance                                    Cognition Arousal/Alertness: Awake/alert Behavior During Therapy: WFL for tasks assessed/performed Overall Cognitive Status: Within Functional Limits for tasks assessed                      Exercises General Exercises - Lower Extremity Ankle Circles/Pumps: AROM;10 reps Heel Slides: Strengthening;10 reps Hip ABduction/ADduction: 10 reps;AROM Straight Leg Raises: AROM;10 reps    General Comments        Pertinent Vitals/Pain  Pain Assessment: No/denies pain    Home Living                      Prior Function            PT Goals (current goals can now be found in the care plan section) Acute Rehab PT Goals PT Goal Formulation: Patient unable to participate in goal setting Progress towards PT goals: Progressing toward goals    Frequency  Min 2X/week    PT Plan Current plan remains appropriate    Co-evaluation             End of Session Equipment Utilized During Treatment: Gait belt Activity Tolerance: Patient tolerated treatment well;No increased pain;Patient limited by fatigue;Patient limited by lethargy Patient left: in bed;with family/visitor present;with call bell/phone within reach;with bed alarm set     Time: 1313-1340 PT Time Calculation (min) (ACUTE ONLY): 27 min  Charges:  $Gait Training:  8-22 mins $Therapeutic Exercise: 8-22 mins                    G Codes:      Malachi ProGalen R Gerry Heaphy, DPT 08/30/2015, 3:29 PM

## 2015-08-31 DIAGNOSIS — E43 Unspecified severe protein-calorie malnutrition: Secondary | ICD-10-CM | POA: Insufficient documentation

## 2015-08-31 DIAGNOSIS — G9349 Other encephalopathy: Secondary | ICD-10-CM | POA: Diagnosis not present

## 2015-08-31 MED ORDER — DOXYCYCLINE HYCLATE 100 MG PO TABS
100.0000 mg | ORAL_TABLET | Freq: Two times a day (BID) | ORAL | Status: AC
Start: 2015-08-31 — End: ?

## 2015-08-31 MED ORDER — CHLORHEXIDINE GLUCONATE CLOTH 2 % EX PADS
6.0000 | MEDICATED_PAD | Freq: Every day | CUTANEOUS | Status: DC
  Administered 2015-08-31: 10:00:00 6 via TOPICAL

## 2015-08-31 MED ORDER — MUPIROCIN 2 % EX OINT
1.0000 "application " | TOPICAL_OINTMENT | Freq: Two times a day (BID) | CUTANEOUS | Status: DC
  Administered 2015-08-31: 1 via NASAL
  Filled 2015-08-31: qty 22

## 2015-08-31 NOTE — Clinical Social Work Placement (Signed)
   CLINICAL SOCIAL WORK PLACEMENT  NOTE  Date:  08/31/2015  Patient Details  Name: Martha Hunter MRN: [id number] Date of Birth: Jul 03, 1928  Clinical Social Work is seeking post-discharge placement for this patient at the Skilled  Nursing Facility level of care (*CSW will initial, date and re-position this form in  chart as items are completed):  Yes   Patient/family provided with [facility] Clinical Social Work Department's list of facilities offering this level of care within the geographic area requested by the patient (or if unable, by the patient's family).  Yes   Patient/family informed of their freedom to choose among providers that offer the needed level of care, that participate in Medicare, Medicaid or managed care program needed by the patient, have an available bed and are willing to accept the patient.  Yes   Patient/family informed of [place] [facility]'s ownership interest in [facility] and [facility], as well as of the fact that they are under no obligation to receive care at these facilities.  PASRR submitted to EDS on       PASRR number received on       Existing PASRR number confirmed on 08/29/15     FL2 transmitted to all facilities in geographic area requested by pt/family on 08/29/15     FL2 transmitted to all facilities within larger geographic area on       Patient informed that his/her managed care company has contracts with or will negotiate with certain facilities, including the following:        Yes   Patient/family informed of bed offers received.  Patient chooses bed at [facility]     Physician recommends and patient chooses bed at      Patient to be transferred to [facility] on 08/31/15.  Patient to be [facility]     Patient family notified on 08/31/15 of transfer.  Name of family member notified:  Pt's son Vonna KotykJay.      PHYSICIAN       Additional Comment:     _______________________________________________ Dede QuerySarah Blanch Stang, LCSW 08/31/2015, 4:04 PM

## 2015-08-31 NOTE — Discharge Instructions (Signed)
Heart healthy diet. °Activity as tolerated. °HHPT. °

## 2015-08-31 NOTE — Clinical Social Work Note (Signed)
Pt is ready for discharge today and will go to [facility]. PT is recommending STR at SNF, however pt is under Medicare OBS. CSW spoke with [facility] and pt is able to return with additional support and home health services. Pt's family will make arrangement with sitters. CSW verified that pt is able to use her Medicare benefit as discharged from [facility] last week. Pt is under her "recall period" per facility therefore will not require a 3 night qualifying stay for this admission. Pt's son would prefer pt return [facility]. Pt's son is agreeable to discharge plan. Facility has received discharge information and is ready to admit pt. RN called report and EMS will provide transportation. CSW is signing off as no further needs identified.   Dede QuerySarah Lizett Chowning, MSW, LCSW Clinical Social Worker  [phone]

## 2015-08-31 NOTE — Progress Notes (Signed)
Pt is alert and oriented with intermittent confusion, bed bound at this time with generalized weakness, incontinent of urine and stool, bm during shift, family at bedside, on room air, patient is d/c to snf at [facility], report given to [place], patient will continue on doxycycline po for UTI, pt to be transported via EMS. Uneventful shift.

## 2015-08-31 NOTE — Discharge Summary (Signed)
[facility] [place] at [facility]   PATIENT NAME: Martha Hunter    MR#:  [id number]  DATE OF BIRTH:  [birth year]  DATE OF ADMISSION:  08/28/2015 ADMITTING PHYSICIAN: Alford Highland, MD  DATE OF DISCHARGE: 08/31/2015  PRIMARY CARE PHYSICIAN: BABAOFF, MARC E, MD    ADMISSION DIAGNOSIS:  Dehydration [E86.0] Weakness [R53.1] UTI (lower urinary tract infection) [N39.0]  DISCHARGE DIAGNOSIS:  Active Problems:   Acute encephalopathy   Pressure ulcer   Protein-calorie malnutrition, severe  Acute encephalopathy secondary to acute cystitis.  SECONDARY DIAGNOSIS:   Past Medical History  Diagnosis Date  . Hypertension   . Hypercholesteremia   . Depression   . Staph infection   . CAP (community acquired pneumonia)     a. 07/2014.  Marland Kitchen History of TIA (transient ischemic attack)     a. on plavix chronically.  . Cystocele   . Rectocele   . Bladder spasm   . Hyperlipidemia   . Vaginal atrophy   . Bladder instability   . UTI (lower urinary tract infection)     HOSPITAL COURSE:  1. Acute encephalopathy secondary to acute cystitis. Improved to her baseline.  2. Acute cystitis without hematuria.  discontinued IV Rocephin. Urine culture: MRSA. Started doxycycline.   3. Weakness.  Per physical therapy, Supervision/Assistance - 24 hour;Supervision - Intermittent;Supervision for mobility/OOB (Return to ALF if they are able to provide physical assistance; otherwise will require a higher level of assistance.  PT reevaluation suggested SNF. It is not safe to discharge to ALF. I discussed with SW and CM.but she has to be discharged to ALF with HHPT due to insurance per SW.  4. Essential hypertension. Continue her usual blood pressure medications 5. History of TIA on Plavix and statin 6. Hyperlipidemia unspecified continue statin  7. Cerumen impaction. Debrox otic solution ordered twice a day 8. Acute kidney injury on chronic kidney disease stage III.   Improved with gentle IV fluid hydration.  9. GERD on PPI 10. Weight loss. The patient had a CT scan of the abdomen and pelvis 07/20/2014 which was negative for any cancerous process. CT scan of the chest was done in 2015 again negative for any cancerous process. Further workup can be done as outpatient.  Severe malnutrition. Encouraged oral intake, better oral intake.  11. Decubitus ulcer present on admission. wound care.   DISCHARGE CONDITIONS:   Stable, discharge to ALF with HHPT.  CONSULTS OBTAINED:     DRUG ALLERGIES:   Allergies  Allergen Reactions  . Niacin And Related   . Nitrates, Organic Other (See Comments)    Reaction: unknown  . Penicillins Other (See Comments)    Has patient had a PCN reaction causing immediate rash, facial/tongue/throat swelling, SOB or lightheadedness with hypotension: unknown  Has patient had a PCN reaction causing severe rash involving mucus membranes or skin necrosis: unknown Has patient had a PCN reaction that required hospitalization: unknown Has patient had a PCN reaction occurring within the last 10 years: unknown If all of the above answers are "NO", then may proceed with Cephalosporin use.  Reactions not listed on MAR  . Sulfa Antibiotics Other (See Comments)    Reaction: unknown     DISCHARGE MEDICATIONS:   Current Discharge Medication List    START taking these medications   Details  doxycycline (VIBRA-TABS) 100 MG tablet Take 1 tablet (100 mg total) by mouth 2 (two) times daily. Qty: 6 tablet, Refills: 0      CONTINUE these medications  which have NOT CHANGED   Details  acetaminophen (TYLENOL) 500 MG tablet Take 500-1,000 mg by mouth every 4 (four) hours as needed for mild pain.     amLODipine (NORVASC) 10 MG tablet Take 1 tablet (10 mg total) by mouth daily. Qty: 30 tablet, Refills: 0    calcium carbonate (TUMS EX) 750 MG chewable tablet Chew 1 tablet by mouth every 4 (four) hours as needed for heartburn.     Cholecalciferol (VITAMIN D-3) 1000 UNITS CAPS Take 1,000 Units by mouth daily.     clopidogrel (PLAVIX) 75 MG tablet Take 75 mg by mouth every morning.     diazepam (VALIUM) 5 MG tablet Take 5 mg by mouth at bedtime.     ferrous sulfate 325 (65 FE) MG tablet Take 325 mg by mouth daily.     guaiFENesin-dextromethorphan (ROBITUSSIN DM) 100-10 MG/5ML syrup Take 5 mLs by mouth every 4 (four) hours as needed for cough. Qty: 118 mL, Refills: 0    hydrALAZINE (APRESOLINE) 50 MG tablet Take 1.5 tablets (75 mg total) by mouth every 8 (eight) hours. Qty: 90 tablet, Refills: 0    loperamide (IMODIUM) 2 MG capsule Take 2 mg by mouth 3 (three) times daily as needed for diarrhea or loose stools.    losartan (COZAAR) 100 MG tablet Take 100 mg by mouth every morning.     omeprazole (PRILOSEC) 20 MG capsule Take 20 mg by mouth daily.    ondansetron (ZOFRAN) 4 MG tablet Take 4 mg by mouth every 8 (eight) hours as needed for nausea or vomiting.    oxybutynin (DITROPAN) 5 MG tablet Take 5 mg by mouth at bedtime.     potassium chloride SA (K-DUR,KLOR-CON) 20 MEQ tablet Take 20 mEq by mouth daily.    pravastatin (PRAVACHOL) 20 MG tablet Take 20 mg by mouth every evening.     Probiotic Product (PROBIOTIC-10 ULTIMATE PO) Take 1 capsule by mouth daily.    sertraline (ZOLOFT) 100 MG tablet Take 100 mg by mouth every morning.          DISCHARGE INSTRUCTIONS:    DIET:  Heart healthy diet.  DISCHARGE CONDITION:  Stable.  ACTIVITY:  As tolerated.  DISCHARGE LOCATION:   If you experience worsening of your admission symptoms, develop shortness of breath, life threatening emergency, suicidal or homicidal thoughts you must seek medical attention immediately by calling 911 or calling your MD immediately  if symptoms less severe.  You Must read complete instructions/literature along with all the possible adverse reactions/side effects for all the Medicines you take and that have been prescribed to  you. Take any new Medicines after you have completely understood and accpet all the possible adverse reactions/side effects.   Please note  You were cared for by a hospitalist during your hospital stay. If you have any questions about your discharge medications or the care you received while you were in the hospital after you are discharged, you can call the unit and asked to speak with the hospitalist on call if the hospitalist that took care of you is not available. Once you are discharged, your primary care physician will handle any further medical issues. Please note that NO REFILLS for any discharge medications will be authorized once you are discharged, as it is imperative that you return to your primary care physician (or establish a relationship with a primary care physician if you do not have one) for your aftercare needs so that they can reassess your need for medications and monitor  your lab values.    On the day of Discharge:  VITAL SIGNS:  Blood pressure 126/56, pulse 66, temperature 98.7 F (37.1 C), temperature source Oral, resp. rate 18, height 5' (1.524 m), weight 129 lb 3.2 oz (58.605 kg), SpO2 95 %.  PHYSICAL EXAMINATION:  GENERAL:  82 y.o.-year-old patient lying in the bed with no acute distress. Looks fragile.  EYES: Pupils equal, round, reactive to light and accommodation. No scleral icterus. Extraocular muscles intact.  HEENT: Head atraumatic, normocephalic. Oropharynx and nasopharynx clear.  NECK:  Supple, no jugular venous distention. No thyroid enlargement, no tenderness.  LUNGS: Normal breath sounds bilaterally, no wheezing, rales,rhonchi or crepitation. No use of accessory muscles of respiration.  CARDIOVASCULAR: S1, S2 normal. No murmurs, rubs, or gallops.  ABDOMEN: Soft, non-tender, non-distended. Bowel sounds present. No organomegaly or mass.  EXTREMITIES: No pedal edema, cyanosis, or clubbing.  NEUROLOGIC: Cranial nerves II through XII are intact. Muscle strength  4/5 in all extremities. Sensation intact. Gait not checked.  PSYCHIATRIC: The patient is alert and oriented x 3.  SKIN: No obvious rash, lesion, or ulcer.  DATA REVIEW:   CBC  Recent Labs Lab 08/29/15 0638  WBC 8.1  HGB 12.3  HCT 37.9  PLT 195    Chemistries   Recent Labs Lab 08/28/15 0927  08/30/15 0547  NA 137  < > 139  K 4.7  < > 3.9  CL 108  < > 112*  CO2 22  < > 20*  GLUCOSE 93  < > 97  BUN 38*  < > 20  CREATININE 1.72*  < > 1.17*  CALCIUM 9.8  < > 9.1  AST 15  --   --   ALT 8*  --   --   ALKPHOS 71  --   --   BILITOT 0.7  --   --   < > = values in this interval not displayed.  Cardiac Enzymes  Recent Labs Lab 08/28/15 0927  TROPONINI <0.03    Microbiology Results  Results for orders placed or performed during the hospital encounter of 08/28/15  Urine culture     Status: Abnormal   Collection Time: 08/28/15  9:27 AM  Result Value Ref Range Status   Specimen Description URINE, CATHETERIZED  Final   Special Requests Normal  Final   Culture (A)  Final    >=100,000 COLONIES/mL STAPHYLOCOCCUS SPECIES (COAGULASE NEGATIVE)   Report Status 08/30/2015 FINAL  Final   Organism ID, Bacteria STAPHYLOCOCCUS SPECIES (COAGULASE NEGATIVE) (A)  Final      Susceptibility   Staphylococcus species (coagulase negative) - MIC*    CIPROFLOXACIN >=8 RESISTANT Resistant     GENTAMICIN <=0.5 SENSITIVE Sensitive     NITROFURANTOIN <=16 SENSITIVE Sensitive     OXACILLIN Value in next row Resistant      >=4 RESISTANTWARNING: For oxacillin-resistant S.aureus and coagulase-negative staphylococci (MRS), other beta-lactam agents, ie, penicillins, beta-lactam/beta-lactamase inhibitor combinations, cephems (with the exception of the cephalosporins with anti-MRSA activity), and carbapenems, may appear active in vitro, but are not effective clinically.  --CLSI, Vol.32 No.3, January 2012, pg 70.    TETRACYCLINE Value in next row Sensitive      >=4 RESISTANTWARNING: For  oxacillin-resistant S.aureus and coagulase-negative staphylococci (MRS), other beta-lactam agents, ie, penicillins, beta-lactam/beta-lactamase inhibitor combinations, cephems (with the exception of the cephalosporins with anti-MRSA activity), and carbapenems, may appear active in vitro, but are not effective clinically.  --CLSI, Vol.32 No.3, January 2012, pg 70.    VANCOMYCIN Value in next  row Sensitive      >=4 RESISTANTWARNING: For oxacillin-resistant S.aureus and coagulase-negative staphylococci (MRS), other beta-lactam agents, ie, penicillins, beta-lactam/beta-lactamase inhibitor combinations, cephems (with the exception of the cephalosporins with anti-MRSA activity), and carbapenems, may appear active in vitro, but are not effective clinically.  --CLSI, Vol.32 No.3, January 2012, pg 70.    TRIMETH/SULFA Value in next row Sensitive      >=4 RESISTANTWARNING: For oxacillin-resistant S.aureus and coagulase-negative staphylococci (MRS), other beta-lactam agents, ie, penicillins, beta-lactam/beta-lactamase inhibitor combinations, cephems (with the exception of the cephalosporins with anti-MRSA activity), and carbapenems, may appear active in vitro, but are not effective clinically.  --CLSI, Vol.32 No.3, January 2012, pg 70.    CLINDAMYCIN Value in next row Resistant      >=4 RESISTANTWARNING: For oxacillin-resistant S.aureus and coagulase-negative staphylococci (MRS), other beta-lactam agents, ie, penicillins, beta-lactam/beta-lactamase inhibitor combinations, cephems (with the exception of the cephalosporins with anti-MRSA activity), and carbapenems, may appear active in vitro, but are not effective clinically.  --CLSI, Vol.32 No.3, January 2012, pg 70.    RIFAMPIN Value in next row Sensitive      >=4 RESISTANTWARNING: For oxacillin-resistant S.aureus and coagulase-negative staphylococci (MRS), other beta-lactam agents, ie, penicillins, beta-lactam/beta-lactamase inhibitor combinations, cephems (with the  exception of the cephalosporins with anti-MRSA activity), and carbapenems, may appear active in vitro, but are not effective clinically.  --CLSI, Vol.32 No.3, January 2012, pg 70.    * >=100,000 COLONIES/mL STAPHYLOCOCCUS SPECIES (COAGULASE NEGATIVE)  MRSA PCR Screening     Status: Abnormal   Collection Time: 08/28/15  2:55 PM  Result Value Ref Range Status   MRSA by PCR POSITIVE (A) NEGATIVE Final    Comment:        The GeneXpert MRSA Assay (FDA approved for NASAL specimens only), is one component of a comprehensive MRSA colonization surveillance program. It is not intended to diagnose MRSA infection nor to guide or monitor treatment for MRSA infections. CRITICAL RESULT CALLED TO, READ BACK BY AND VERIFIED WITH: DEDRA MALCOLM 08/28/15 AT 1637 BY HS     RADIOLOGY:  No results found.   Management plans discussed with the patient, her daughter and they are in agreement.  CODE STATUS:     Code Status Orders        Start     Ordered   08/28/15 1237  Do not attempt resuscitation (DNR)   Continuous    Question Answer Comment  In the event of cardiac or respiratory ARREST Do not call a "code blue"   In the event of cardiac or respiratory ARREST Do not perform Intubation, CPR, defibrillation or ACLS   In the event of cardiac or respiratory ARREST Use medication by any route, position, wound care, and other measures to relive pain and suffering. May use oxygen, suction and manual treatment of airway obstruction as needed for comfort.   Comments Nurse may pronounce      08/28/15 1237    Code Status History    Date Active Date Inactive Code Status Order ID Comments User Context   07/16/2015  3:28 PM 07/20/2015  4:05 PM Full Code [id number]  Alford Highland, MD ED   05/27/2015  3:26 PM 05/30/2015  5:28 PM Full Code [id number]  Houston Siren, MD Inpatient   07/20/2014  5:54 AM 07/23/2014  6:35 PM Full Code [id number]  Arnaldo Natal, MD Inpatient    Advance Directive Documentation         Most Recent Value   Type of Advance Directive  Healthcare Power  of Attorney, Living will   Pre-existing out of facility DNR order (yellow form or pink MOST form)     "MOST" Form in Place?        TOTAL TIME TAKING CARE OF THIS PATIENT: 38 minutes.    Shaune Pollack M.D on 08/31/2015 at 11:09 AM  Between 7am to 6pm - Pager - [phone]  After 6pm go to [email or web address] - password [email or web address] [facility]  [facility]  Office  [phone]  CC: Primary care physician; BABAOFF, Lavada Mesi, MD   Note: This dictation was prepared with Dragon dictation along with smaller phrase technology. Any transcriptional errors that result from this process are unintentional.

## 2015-08-31 NOTE — Progress Notes (Signed)
EMS arrived at 13:35, son at bedside and aware of transport, vital signs stable.

## 2015-10-27 ENCOUNTER — Telehealth: Payer: Self-pay | Admitting: Obstetrics and Gynecology

## 2015-10-27 NOTE — Telephone Encounter (Signed)
Pt daughter in law called and said Windell MouldingRuth has a chronic yeast infection/ rash and [facility].

## 2015-10-28 MED ORDER — NYSTATIN-TRIAMCINOLONE 100000-0.1 UNIT/GM-% EX CREA: 1.0000 "application " | TOPICAL_CREAM | Freq: Two times a day (BID) | CUTANEOUS | 3 refills | Status: AC

## 2015-10-28 NOTE — Telephone Encounter (Signed)
Beth aware nystatin trimcinolone erx. [facility] [employer]. Bonita QuinLinda busick also aware to use [facility]. Order faxed to [phone].

## 2015-12-24 ENCOUNTER — Telehealth: Payer: Self-pay | Admitting: Obstetrics and Gynecology

## 2015-12-24 ENCOUNTER — Ambulatory Visit: Payer: Medicare Other | Admitting: Obstetrics and Gynecology

## 2015-12-24 NOTE — Telephone Encounter (Signed)
Patients daughter in law called to cancel patients appointment. She just wanted to speak with you to see if she really needed to reschedule or not. Thanks

## 2015-12-28 NOTE — Telephone Encounter (Signed)
Martha Hunter states they missed appt last week. She states Martha Hunter has had a challenging year. Several mini strokes. However, overall stable at this point. Her rash is being controlled with  Nystatin powder. Using desitin when rash is clear. She is being [facility] q [facility] hours [facility]. Both Martha Hunter and Pt feel she is stable at this point. Advised she would need to be seen at least yearly for medication refills. Martha Hunter voiced understanding. Will need an appt in 06/2016 or sooner if needed.

## 2016-01-30 ENCOUNTER — Emergency Department: Payer: Medicare Other

## 2016-01-30 ENCOUNTER — Inpatient Hospital Stay
Admission: EM | Admit: 2016-01-30 | Discharge: 2016-02-15 | DRG: 064 | Disposition: E | Payer: Medicare Other | Attending: Internal Medicine | Admitting: Internal Medicine

## 2016-01-30 ENCOUNTER — Inpatient Hospital Stay: Payer: Medicare Other

## 2016-01-30 ENCOUNTER — Encounter: Payer: Self-pay | Admitting: Emergency Medicine

## 2016-01-30 ENCOUNTER — Inpatient Hospital Stay (HOSPITAL_COMMUNITY)
Admit: 2016-01-30 | Discharge: 2016-01-30 | Disposition: A | Discharge status: Home / Self Care | Payer: Medicare Other | Attending: Internal Medicine | Admitting: Internal Medicine

## 2016-01-30 DIAGNOSIS — F419 Anxiety disorder, unspecified: Secondary | ICD-10-CM | POA: Diagnosis present

## 2016-01-30 DIAGNOSIS — Z8249 Family history of ischemic heart disease and other diseases of the circulatory system: Secondary | ICD-10-CM | POA: Diagnosis not present

## 2016-01-30 DIAGNOSIS — E785 Hyperlipidemia, unspecified: Secondary | ICD-10-CM | POA: Diagnosis present

## 2016-01-30 DIAGNOSIS — R531 Weakness: Secondary | ICD-10-CM

## 2016-01-30 DIAGNOSIS — K529 Noninfective gastroenteritis and colitis, unspecified: Secondary | ICD-10-CM | POA: Diagnosis present

## 2016-01-30 DIAGNOSIS — R1310 Dysphagia, unspecified: Secondary | ICD-10-CM | POA: Diagnosis present

## 2016-01-30 DIAGNOSIS — I639 Cerebral infarction, unspecified: Secondary | ICD-10-CM | POA: Diagnosis not present

## 2016-01-30 DIAGNOSIS — R29810 Facial weakness: Secondary | ICD-10-CM | POA: Diagnosis present

## 2016-01-30 DIAGNOSIS — R4020 Unspecified coma: Secondary | ICD-10-CM | POA: Diagnosis present

## 2016-01-30 DIAGNOSIS — I10 Essential (primary) hypertension: Secondary | ICD-10-CM | POA: Diagnosis present

## 2016-01-30 DIAGNOSIS — Z66 Do not resuscitate: Secondary | ICD-10-CM | POA: Diagnosis present

## 2016-01-30 DIAGNOSIS — Z7409 Other reduced mobility: Secondary | ICD-10-CM

## 2016-01-30 DIAGNOSIS — Z7189 Other specified counseling: Secondary | ICD-10-CM | POA: Diagnosis not present

## 2016-01-30 DIAGNOSIS — R4781 Slurred speech: Secondary | ICD-10-CM

## 2016-01-30 DIAGNOSIS — A419 Sepsis, unspecified organism: Secondary | ICD-10-CM | POA: Diagnosis not present

## 2016-01-30 DIAGNOSIS — E860 Dehydration: Secondary | ICD-10-CM

## 2016-01-30 DIAGNOSIS — R471 Dysarthria and anarthria: Secondary | ICD-10-CM | POA: Diagnosis present

## 2016-01-30 DIAGNOSIS — I69320 Aphasia following cerebral infarction: Secondary | ICD-10-CM

## 2016-01-30 DIAGNOSIS — J9601 Acute respiratory failure with hypoxia: Secondary | ICD-10-CM | POA: Diagnosis not present

## 2016-01-30 DIAGNOSIS — N179 Acute kidney failure, unspecified: Secondary | ICD-10-CM | POA: Diagnosis present

## 2016-01-30 DIAGNOSIS — I6350 Cerebral infarction due to unspecified occlusion or stenosis of unspecified cerebral artery: Secondary | ICD-10-CM | POA: Diagnosis not present

## 2016-01-30 DIAGNOSIS — Z87891 Personal history of nicotine dependence: Secondary | ICD-10-CM

## 2016-01-30 DIAGNOSIS — R509 Fever, unspecified: Secondary | ICD-10-CM

## 2016-01-30 DIAGNOSIS — Z515 Encounter for palliative care: Secondary | ICD-10-CM | POA: Diagnosis not present

## 2016-01-30 DIAGNOSIS — F0390 Unspecified dementia without behavioral disturbance: Secondary | ICD-10-CM | POA: Diagnosis present

## 2016-01-30 DIAGNOSIS — E7800 Pure hypercholesterolemia, unspecified: Secondary | ICD-10-CM | POA: Diagnosis present

## 2016-01-30 DIAGNOSIS — M6281 Muscle weakness (generalized): Secondary | ICD-10-CM

## 2016-01-30 DIAGNOSIS — R262 Difficulty in walking, not elsewhere classified: Secondary | ICD-10-CM

## 2016-01-30 DIAGNOSIS — Z7902 Long term (current) use of antithrombotics/antiplatelets: Secondary | ICD-10-CM | POA: Diagnosis not present

## 2016-01-30 LAB — BASIC METABOLIC PANEL
ANION GAP: 9 (ref 5–15)
BUN: 25 mg/dL — ABNORMAL HIGH (ref 6–20)
CO2: 24 mmol/L (ref 22–32)
Calcium: 9.6 mg/dL (ref 8.9–10.3)
Chloride: 105 mmol/L (ref 101–111)
Creatinine, Ser: 1.55 mg/dL — ABNORMAL HIGH (ref 0.44–1.00)
GFR calc Af Amer: 34 mL/min — ABNORMAL LOW (ref >60)
GFR, EST NON AFRICAN AMERICAN: 29 mL/min — AB (ref >60)
GLUCOSE: 91 mg/dL (ref 65–99)
POTASSIUM: 4.7 mmol/L (ref 3.5–5.1)
Sodium: 138 mmol/L (ref 135–145)

## 2016-01-30 LAB — CBC
HEMATOCRIT: 37.3 % (ref 35.0–47.0)
Hemoglobin: 12.5 g/dL (ref 12.0–16.0)
MCH: 27.9 pg (ref 26.0–34.0)
MCHC: 33.5 g/dL (ref 32.0–36.0)
MCV: 83.1 fL (ref 80.0–100.0)
Platelets: 200 10*3/uL (ref 150–440)
RBC: 4.49 MIL/uL (ref 3.80–5.20)
RDW: 15.5 % — ABNORMAL HIGH (ref 11.5–14.5)
WBC: 9.5 10*3/uL (ref 3.6–11.0)

## 2016-01-30 LAB — URINALYSIS, COMPLETE (UACMP) WITH MICROSCOPIC
BILIRUBIN URINE: NEGATIVE
Glucose, UA: NEGATIVE mg/dL
KETONES UR: 5 mg/dL — AB
NITRITE: NEGATIVE
PROTEIN: 100 mg/dL — AB
Specific Gravity, Urine: 1.013 (ref 1.005–1.030)
pH: 5 (ref 5.0–8.0)

## 2016-01-30 LAB — PROTIME-INR
INR: 1.02
PROTHROMBIN TIME: 13.4 s (ref 11.4–15.2)

## 2016-01-30 LAB — TROPONIN I: Troponin I: <0.03 ng/mL (ref <0.03)

## 2016-01-30 LAB — MRSA PCR SCREENING: MRSA by PCR: POSITIVE — AB

## 2016-01-30 MED ORDER — SERTRALINE HCL 50 MG PO TABS: 100.0000 mg | ORAL_TABLET | ORAL | Status: DC

## 2016-01-30 MED ORDER — DIAZEPAM 5 MG PO TABS: 5.0000 mg | ORAL_TABLET | Freq: Every day | ORAL | Status: DC

## 2016-01-30 MED ORDER — HYDRALAZINE HCL 50 MG PO TABS: 75.0000 mg | ORAL_TABLET | Freq: Three times a day (TID) | ORAL | Status: DC

## 2016-01-30 MED ORDER — SENNOSIDES-DOCUSATE SODIUM 8.6-50 MG PO TABS
1.0000 | ORAL_TABLET | Freq: Every evening | ORAL | Status: DC | PRN
Start: 2016-01-30 — End: 2016-01-31

## 2016-01-30 MED ORDER — ACETAMINOPHEN 650 MG RE SUPP
650.0000 mg | RECTAL | Status: DC | PRN
  Administered 2016-01-30 – 2016-02-01 (×2): 650 mg via RECTAL
  Filled 2016-01-30 (×3): qty 1

## 2016-01-30 MED ORDER — AMLODIPINE BESYLATE 10 MG PO TABS: 10.0000 mg | ORAL_TABLET | Freq: Every day | ORAL | Status: DC

## 2016-01-30 MED ORDER — LOSARTAN POTASSIUM 50 MG PO TABS: 100.0000 mg | ORAL_TABLET | ORAL | Status: DC

## 2016-01-30 MED ORDER — CLOPIDOGREL BISULFATE 75 MG PO TABS: 75.0000 mg | ORAL_TABLET | ORAL | Status: DC

## 2016-01-30 MED ORDER — OXYBUTYNIN CHLORIDE 5 MG PO TABS: 5.0000 mg | ORAL_TABLET | Freq: Every day | ORAL | Status: DC

## 2016-01-30 MED ORDER — ASPIRIN 325 MG PO TABS: 325.0000 mg | ORAL_TABLET | Freq: Every day | ORAL | Status: DC

## 2016-01-30 MED ORDER — PRAVASTATIN SODIUM 20 MG PO TABS: 20.0000 mg | ORAL_TABLET | Freq: Every evening | ORAL | Status: DC

## 2016-01-30 MED ORDER — LOPERAMIDE HCL 2 MG PO CAPS: 2.0000 mg | ORAL_CAPSULE | Freq: Three times a day (TID) | ORAL | Status: DC | PRN

## 2016-01-30 MED ORDER — ACETAMINOPHEN 325 MG PO TABS: 650.0000 mg | ORAL_TABLET | ORAL | Status: DC | PRN

## 2016-01-30 MED ORDER — HEPARIN SODIUM (PORCINE) 5000 UNIT/ML IJ SOLN
5000.0000 [IU] | Freq: Three times a day (TID) | INTRAMUSCULAR | Status: DC
  Administered 2016-01-30 – 2016-01-31 (×4): 5000 [IU] via SUBCUTANEOUS
  Filled 2016-01-30 (×4): qty 1

## 2016-01-30 MED ORDER — MUPIROCIN 2 % EX OINT
1.0000 "application " | TOPICAL_OINTMENT | Freq: Two times a day (BID) | CUTANEOUS | Status: DC
  Administered 2016-01-30 – 2016-02-03 (×8): 1 via NASAL
  Filled 2016-01-30: qty 22

## 2016-01-30 MED ORDER — SODIUM CHLORIDE 0.9 % IV BOLUS (SEPSIS)
1000.0000 mL | Freq: Once | INTRAVENOUS | Status: AC
Start: 2016-01-30 — End: 2016-01-30
  Administered 2016-01-30: 1000 mL via INTRAVENOUS

## 2016-01-30 MED ORDER — GUAIFENESIN-DM 100-10 MG/5ML PO SYRP: 5.0000 mL | ORAL_SOLUTION | ORAL | Status: DC | PRN

## 2016-01-30 MED ORDER — PANTOPRAZOLE SODIUM 40 MG PO TBEC: 40.0000 mg | DELAYED_RELEASE_TABLET | Freq: Every day | ORAL | Status: DC

## 2016-01-30 MED ORDER — CHLORHEXIDINE GLUCONATE CLOTH 2 % EX PADS
6.0000 | MEDICATED_PAD | Freq: Every day | CUTANEOUS | Status: DC
  Administered 2016-01-31: 05:00:00 6 via TOPICAL

## 2016-01-30 MED ORDER — FERROUS SULFATE 325 (65 FE) MG PO TABS: 325.0000 mg | ORAL_TABLET | Freq: Every day | ORAL | Status: DC

## 2016-01-30 MED ORDER — SODIUM CHLORIDE 0.9 % IV SOLN
INTRAVENOUS | Status: DC
  Administered 2016-01-30 – 2016-02-02 (×4): via INTRAVENOUS

## 2016-01-30 MED ORDER — PROBIOTIC-10 ULTIMATE PO CAPS: 1.0000 | ORAL_CAPSULE | Freq: Every day | ORAL | Status: DC

## 2016-01-30 MED ORDER — ASPIRIN 300 MG RE SUPP
300.0000 mg | Freq: Every day | RECTAL | Status: DC
  Administered 2016-01-30 – 2016-01-31 (×2): 300 mg via RECTAL
  Filled 2016-01-30 (×2): qty 1

## 2016-01-30 MED ORDER — RISAQUAD PO CAPS: 1.0000 | ORAL_CAPSULE | Freq: Every day | ORAL | Status: DC

## 2016-01-30 MED ORDER — STROKE: EARLY STAGES OF RECOVERY BOOK
Freq: Once | Status: AC
  Administered 2016-01-30: 14:00:00

## 2016-01-30 MED ORDER — ACETAMINOPHEN 160 MG/5ML PO SOLN: 650.0000 mg | ORAL | Status: DC | PRN

## 2016-01-30 MED ORDER — MIRTAZAPINE 15 MG PO TABS
7.5000 mg | ORAL_TABLET | Freq: Every day | ORAL | Status: DC
Start: 2016-01-30 — End: 2016-01-31
  Filled 2016-01-30: qty 1

## 2016-01-30 MED ORDER — POTASSIUM CHLORIDE CRYS ER 20 MEQ PO TBCR: 20.0000 meq | EXTENDED_RELEASE_TABLET | Freq: Every day | ORAL | Status: DC

## 2016-01-30 NOTE — ED Provider Notes (Signed)
[facility] Emergency Department Provider Note  ____________________________________________   I have reviewed the triage vital signs and the nursing notes.   HISTORY  Chief Complaint Weakness    HPI Martha Hunter is a 82 y.o. female who woke up this morning was unable to take her pills very well. Patient does have multiple different recent complaints including diarrhea which "has been a lot". As well as cough. Concern also for UTI exists. Patient herself cannot give a good history. She does not say that she feels anything but somewhat weak. She does state she has a cough. She does have a history of dementia. Her son states that she was feeling weaker yesterday.  EMS did not feel there was focality to exam  Level 5 chart caveat; no further history available due to patient status.   Past Medical History:  Diagnosis Date  . Bladder instability   . Bladder spasm   . CAP (community acquired pneumonia)    a. 07/2014.  Marland Kitchen Cystocele   . Depression   . History of TIA (transient ischemic attack)    a. on plavix chronically.  . Hypercholesteremia   . Hyperlipidemia   . Hypertension   . Rectocele   . Staph infection   . UTI (lower urinary tract infection)   . Vaginal atrophy     Patient Active Problem List   Diagnosis Date Noted  . Protein-calorie malnutrition, severe 08/31/2015  . Acute encephalopathy 08/28/2015  . Pressure ulcer 08/28/2015  . Acute renal failure (ARF) (HCC) 07/17/2015  . Hypokalemia 07/16/2015  . Metabolic encephalopathy 05/29/2015  . Altered mental status 05/27/2015  . Anxiety 10/14/2014  . Clinical depression 10/14/2014  . Dizziness 10/14/2014  . HLD (hyperlipidemia) 10/14/2014  . BP (high blood pressure) 10/14/2014  . Adiposity 10/14/2014  . Unstable bladder 10/14/2014  . Cystocele 10/14/2014  . Vaginal atrophy 10/14/2014  . CAP (community acquired pneumonia) 07/23/2014  . Chronic kidney disease (CKD), stage III (moderate)  04/07/2014    Past Surgical History:  Procedure Laterality Date  . DILATION AND CURETTAGE OF UTERUS    . EYE SURGERY    . None      Prior to Admission medications   Medication Sig Start Date End Date Taking? Authorizing Provider  acetaminophen (TYLENOL) 500 MG tablet Take 500-1,000 mg by mouth every 4 (four) hours as needed for mild pain.    Yes Historical Provider, MD  amLODipine (NORVASC) 10 MG tablet Take 1 tablet (10 mg total) by mouth daily. 07/20/15  Yes Ramonita Lab, MD  calcium carbonate (TUMS EX) 750 MG chewable tablet Chew 1 tablet by mouth every 4 (four) hours as needed for heartburn.   Yes Historical Provider, MD  Cholecalciferol (VITAMIN D-3) 1000 UNITS CAPS Take 1,000 Units by mouth daily.    Yes Historical Provider, MD  clopidogrel (PLAVIX) 75 MG tablet Take 75 mg by mouth every morning.    Yes Historical Provider, MD  diazepam (VALIUM) 5 MG tablet Take 5 mg by mouth at bedtime.    Yes Historical Provider, MD  ferrous sulfate 325 (65 FE) MG tablet Take 325 mg by mouth daily.    Yes Historical Provider, MD  guaiFENesin-dextromethorphan (ROBITUSSIN DM) 100-10 MG/5ML syrup Take 5 mLs by mouth every 4 (four) hours as needed for cough. 07/20/15  Yes Ramonita Lab, MD  hydrALAZINE (APRESOLINE) 50 MG tablet Take 1.5 tablets (75 mg total) by mouth every 8 (eight) hours. 07/20/15  Yes Ramonita Lab, MD  loperamide (IMODIUM) 2 MG capsule  Take 2 mg by mouth 3 (three) times daily as needed for diarrhea or loose stools.   Yes Historical Provider, MD  losartan (COZAAR) 100 MG tablet Take 100 mg by mouth every morning.    Yes Historical Provider, MD  mirtazapine (REMERON) 15 MG tablet Take 7.5 mg by mouth at bedtime.   Yes Historical Provider, MD  omeprazole (PRILOSEC) 20 MG capsule Take 20 mg by mouth daily.   Yes Historical Provider, MD  ondansetron (ZOFRAN) 4 MG tablet Take 4 mg by mouth every 8 (eight) hours as needed for nausea or vomiting.   Yes Historical Provider, MD  oxybutynin (DITROPAN) 5  MG tablet Take 5 mg by mouth at bedtime.    Yes Historical Provider, MD  potassium chloride SA (K-DUR,KLOR-CON) 20 MEQ tablet Take 20 mEq by mouth daily.   Yes Historical Provider, MD  pravastatin (PRAVACHOL) 20 MG tablet Take 20 mg by mouth every evening.  11/13/14  Yes Historical Provider, MD  Probiotic Product (PROBIOTIC-10 ULTIMATE PO) Take 1 capsule by mouth daily.   Yes Historical Provider, MD  sertraline (ZOLOFT) 100 MG tablet Take 100 mg by mouth every morning.    Yes Historical Provider, MD  doxycycline (VIBRA-TABS) 100 MG tablet Take 1 tablet (100 mg total) by mouth 2 (two) times daily. Patient not taking: Reported on 01/24/2016 08/31/15   Shaune PollackQing Chen, MD  nystatin-triamcinolone [facility](MYCOLOG II) cream Apply 1 application topically 2 (two) times daily. For 14 days. Patient not taking: Reported on 01/26/2016 10/28/15   Prentice DockerMartin A Defrancesco, MD    Allergies Niacin and related; Nitrates, organic; Penicillins; and Sulfa antibiotics  Family History  Problem Relation Age of Onset  . Congestive Heart Failure Mother   . CAD Father   . CAD      parents and 9 siblings all died with CAD.  Marland Kitchen. Heart disease Sister   . Heart disease Brother   . Cancer Neg Hx   . Diabetes Neg Hx     Social History Social History  Substance Use Topics  . Smoking status: Former Games developermoker  . Smokeless tobacco: Never Used  . Alcohol use No    Review of Systems Level 5 chart caveat; no further history available due to patient status.  Constitutional: No fever/chills Eyes: No visual changes. ENT: No sore throat. No stiff neck no neck pain Cardiovascular: Denies chest pain. Positive cough Gastrointestinal:   no vomiting.  Positive diarrhea diarrhea.  No constipation. Genitourinary: Negative for dysuria. Musculoskeletal: Negative lower extremity swelling Skin: Negative for rash. Neurological: Negative for severe headaches, focal weakness or numbness. 10-point ROS otherwise  negative.  ____________________________________________   PHYSICAL EXAM:  VITAL SIGNS: ED Triage Vitals [01/26/2016 0937]  Enc Vitals Group     BP (!) 143/62     Pulse Rate 73     Resp 16     Temp 97.6 F (36.4 C)     Temp Source Oral     SpO2 94 %     Weight      Height      Head Circumference      Peak Flow      Pain Score      Pain Loc      Pain Edu?      Excl. in GC?     Constitutional: Alert and oriented to name and place, in no acute medical distress Eyes: Conjunctivae are normal. PERRL. EOMI. Head: Atraumatic. Nose: No congestion/rhinnorhea. Mouth/Throat: Mucous membranes are dry.  Oropharynx non-erythematous. Neck: No  stridor.   Nontender with no meningismus Cardiovascular: Normal rate, regular rhythm. Grossly normal heart sounds.  Good peripheral circulation. Respiratory: Normal respiratory effort.  No retractions. Occasional rhonchi. Abdominal: Soft and nontender. No distention. No guarding no rebound Back:  There is no focal tenderness or step off.  there is no midline tenderness there are no lesions noted. there is no CVA tenderness Musculoskeletal: No lower extremity tenderness, no upper extremity tenderness. No joint effusions, no DVT signs strong distal pulses no edema Neurologic:  Patient's speech is somewhat hard understand which apparently is similar to baseline but a little bit worse according to family, she also has a a right facial droop. No other obvious focality noted, limited neurologic exam. Finger to nose is within normal limits. She does not have any obvious loss of strength..Skin:  Skin is warm, dry and intact. No rash noted. Psychiatric: Mood and affect are normal. Speech and behavior are normal.  ____________________________________________   LABS (all labs ordered are listed, but only abnormal results are displayed)  Labs Reviewed  BASIC METABOLIC PANEL - Abnormal; Notable for the following:       Result Value   BUN 25 (*)    Creatinine,  Ser 1.55 (*)    GFR calc non Af Amer 29 (*)    GFR calc Af Amer 34 (*)    All other components within normal limits  CBC - Abnormal; Notable for the following:    RDW 15.5 (*)    All other components within normal limits  URINALYSIS, COMPLETE (UACMP) WITH MICROSCOPIC - Abnormal; Notable for the following:    Color, Urine YELLOW (*)    APPearance HAZY (*)    Hgb urine dipstick MODERATE (*)    Ketones, ur 5 (*)    Protein, ur 100 (*)    Leukocytes, UA SMALL (*)    Bacteria, UA FEW (*)    Squamous Epithelial / LPF 0-5 (*)    All other components within normal limits  PROTIME-INR  TROPONIN I   ____________________________________________  EKG  I personally interpreted any EKGs ordered by me or triage Sinus rhythm rate 72 bpm no acute ischemic changes, artifact limits interpretation. Old anterior infarct noted ____________________________________________  RADIOLOGY  I reviewed any imaging ordered by me or triage that were performed during my shift and, if possible, patient and/or family made aware of any abnormal findings. ____________________________________________   PROCEDURES  Procedure(s) performed: None  Procedures  Critical Care performed: None  ____________________________________________   INITIAL IMPRESSION / ASSESSMENT AND PLAN / ED COURSE  Pertinent labs & imaging results that were available during my care of the patient were reviewed by me and considered in my medical decision making (see chart for details).  Patient here with difficulty speaking and swallowing this morning. She does have what appears to be a right facial droop. However, time of onset is unclear. Patient does have history of mini strokes in the past. She is also period to be dehydrated. We're giving her IV fluid, patient will need to be evaluated in the hospital for further workup of the symptoms. She is not a candidate for TPA as we do not know the time of onset of possible facial  droop.  Clinical Course    ____________________________________________   FINAL CLINICAL IMPRESSION(S) / ED DIAGNOSES  Final diagnoses:  None      This chart was dictated using voice recognition software.  Despite best efforts to proofread,  errors can occur which can change meaning.  Jeanmarie PlantJames A Robinette Esters, MD 01/24/2016 [id number]

## 2016-01-30 NOTE — Clinical Social Work Note (Signed)
CSW received consult for SNF placement for STR. CSW will follow pending PT recs.  Argentina PonderKaren Martha Kazuto Sevey, MSW, LCSW-A [phone]

## 2016-01-30 NOTE — H&P (Addendum)
[employer] [facility] [place] [facility]   PATIENT NAME: Martha Hunter    MR#:  [id number]  DATE OF BIRTH:  [birth year]  DATE OF ADMISSION:  Jul 12, 2015  PRIMARY CARE PHYSICIAN: BABAOFF, Lavada MesiMARC E, MD   REQUESTING/REFERRING PHYSICIAN: Jeanmarie PlantJames A McShane, MD  CHIEF COMPLAINT:   Chief Complaint  Patient presents with  . Weakness   Slurred speech and weakness. HISTORY OF PRESENT ILLNESS:  Martha DialsRuth Hunter  is a 82 y.o. female with a known history of Hypertension, hyperlipidemia, TIA, UTI and dementia. The patient woke up this morning and was unable to take her pills. She was found with slurred speech and trouble swallowing. The patient is demented and not a good historian. She was noticed right-sided facial droop and slurred speech in the ED, suspected acute CVA. But head CT is negative and she is not good candidate for TPA treatment per ED physician. She also has chronic diarrhea which is worse today. PAST MEDICAL HISTORY:   Past Medical History:  Diagnosis Date  . Bladder instability   . Bladder spasm   . CAP (community acquired pneumonia)    a. 07/2014.  Marland Kitchen. Cystocele   . Depression   . History of TIA (transient ischemic attack)    a. on plavix chronically.  . Hypercholesteremia   . Hyperlipidemia   . Hypertension   . Rectocele   . Staph infection   . UTI (lower urinary tract infection)   . Vaginal atrophy     PAST SURGICAL HISTORY:   Past Surgical History:  Procedure Laterality Date  . DILATION AND CURETTAGE OF UTERUS    . EYE SURGERY    . None      SOCIAL HISTORY:   Social History  Substance Use Topics  . Smoking status: Former Games developermoker  . Smokeless tobacco: Never Used  . Alcohol use No    FAMILY HISTORY:   Family History  Problem Relation Age of Onset  . Congestive Heart Failure Mother   . CAD Father   . CAD      parents and 9 siblings all died with CAD.  Marland Kitchen. Heart disease Sister   . Heart disease Brother   . Cancer Neg Hx   . Diabetes Neg Hx      DRUG ALLERGIES:   Allergies  Allergen Reactions  . Niacin And Related   . Nitrates, Organic Other (See Comments)    Reaction: unknown  . Penicillins Other (See Comments)    Has patient had a PCN reaction causing immediate rash, facial/tongue/throat swelling, SOB or lightheadedness with hypotension: unknown  Has patient had a PCN reaction causing severe rash involving mucus membranes or skin necrosis: unknown Has patient had a PCN reaction that required hospitalization: unknown Has patient had a PCN reaction occurring within the last 10 years: unknown If all of the above answers are "NO", then may proceed with Cephalosporin use.  Reactions not listed on MAR  . Sulfa Antibiotics Other (See Comments)    Reaction: unknown     REVIEW OF SYSTEMS:   Review of Systems  Unable to perform ROS: Dementia    MEDICATIONS AT HOME:   Prior to Admission medications   Medication Sig Start Date End Date Taking? Authorizing Provider  acetaminophen (TYLENOL) 500 MG tablet Take 500-1,000 mg by mouth every 4 (four) hours as needed for mild pain.    Yes Historical Provider, MD  amLODipine (NORVASC) 10 MG tablet Take 1 tablet (10 mg total) by mouth daily. 07/20/15  Yes  Ramonita LabAruna Gouru, MD  calcium carbonate (TUMS EX) 750 MG chewable tablet Chew 1 tablet by mouth every 4 (four) hours as needed for heartburn.   Yes Historical Provider, MD  Cholecalciferol (VITAMIN D-3) 1000 UNITS CAPS Take 1,000 Units by mouth daily.    Yes Historical Provider, MD  clopidogrel (PLAVIX) 75 MG tablet Take 75 mg by mouth every morning.    Yes Historical Provider, MD  diazepam (VALIUM) 5 MG tablet Take 5 mg by mouth at bedtime.    Yes Historical Provider, MD  ferrous sulfate 325 (65 FE) MG tablet Take 325 mg by mouth daily.    Yes Historical Provider, MD  guaiFENesin-dextromethorphan (ROBITUSSIN DM) 100-10 MG/5ML syrup Take 5 mLs by mouth every 4 (four) hours as needed for cough. 07/20/15  Yes Ramonita LabAruna Gouru, MD  hydrALAZINE  (APRESOLINE) 50 MG tablet Take 1.5 tablets (75 mg total) by mouth every 8 (eight) hours. 07/20/15  Yes Ramonita LabAruna Gouru, MD  loperamide (IMODIUM) 2 MG capsule Take 2 mg by mouth 3 (three) times daily as needed for diarrhea or loose stools.   Yes Historical Provider, MD  losartan (COZAAR) 100 MG tablet Take 100 mg by mouth every morning.    Yes Historical Provider, MD  mirtazapine (REMERON) 15 MG tablet Take 7.5 mg by mouth at bedtime.   Yes Historical Provider, MD  omeprazole (PRILOSEC) 20 MG capsule Take 20 mg by mouth daily.   Yes Historical Provider, MD  ondansetron (ZOFRAN) 4 MG tablet Take 4 mg by mouth every 8 (eight) hours as needed for nausea or vomiting.   Yes Historical Provider, MD  oxybutynin (DITROPAN) 5 MG tablet Take 5 mg by mouth at bedtime.    Yes Historical Provider, MD  potassium chloride SA (K-DUR,KLOR-CON) 20 MEQ tablet Take 20 mEq by mouth daily.   Yes Historical Provider, MD  pravastatin (PRAVACHOL) 20 MG tablet Take 20 mg by mouth every evening.  11/13/14  Yes Historical Provider, MD  Probiotic Product (PROBIOTIC-10 ULTIMATE PO) Take 1 capsule by mouth daily.   Yes Historical Provider, MD  sertraline (ZOLOFT) 100 MG tablet Take 100 mg by mouth every morning.    Yes Historical Provider, MD  doxycycline (VIBRA-TABS) 100 MG tablet Take 1 tablet (100 mg total) by mouth 2 (two) times daily. Patient not taking: Reported on 01/15/2016 08/31/15   Shaune PollackQing Severo Beber, MD  nystatin-triamcinolone [facility](MYCOLOG II) cream Apply 1 application topically 2 (two) times daily. For 14 days. Patient not taking: Reported on 02/14/2016 10/28/15   Prentice DockerMartin A Defrancesco, MD      VITAL SIGNS:  Blood pressure (!) 145/57, pulse 74, temperature 97.6 F (36.4 C), temperature source Oral, resp. rate (!) 26, SpO2 99 %.  PHYSICAL EXAMINATION:  Physical Exam  GENERAL:  82 y.o.-year-old patient lying in the bed with no acute distress.  EYES: Pupils equal, round, reactive to light and accommodation. No scleral icterus.  Extraocular muscles intact.  HEENT: Head atraumatic, normocephalic. Oropharynx and nasopharynx clear.  NECK:  Supple, no jugular venous distention. No thyroid enlargement, no tenderness.  LUNGS: Normal breath sounds bilaterally, no wheezing, rales,rhonchi or crepitation. No use of accessory muscles of respiration.  CARDIOVASCULAR: S1, S2 normal. No murmurs, rubs, or gallops.  ABDOMEN: Soft, nontender, nondistended. Bowel sounds present. No organomegaly or mass.  EXTREMITIES: No pedal edema, cyanosis, or clubbing.  NEUROLOGIC: Dysarthria and right sided facial droop. Muscle strength 3/5 in all extremities. Sensation intact. Gait not checked.  PSYCHIATRIC: The patient is demented, she knows her name and location. SKIN:  No obvious rash, lesion, or ulcer.   LABORATORY PANEL:   CBC  Recent Labs Lab 02/12/2016 0948  WBC 9.5  HGB 12.5  HCT 37.3  PLT 200   ------------------------------------------------------------------------------------------------------------------  Chemistries   Recent Labs Lab 01/28/2016 0948  NA 138  K 4.7  CL 105  CO2 24  GLUCOSE 91  BUN 25*  CREATININE 1.55*  CALCIUM 9.6   ------------------------------------------------------------------------------------------------------------------  Cardiac Enzymes  Recent Labs Lab 02/07/2016 0948  TROPONINI <0.03   ------------------------------------------------------------------------------------------------------------------  RADIOLOGY:  Dg Chest 2 View  Result Date: 01/20/2016 CLINICAL DATA:  Generalized weakness, cough and chest congestion. EXAM: CHEST  2 VIEW COMPARISON:  07/18/2012. FINDINGS: The cardiac silhouette remains mildly enlarged and the aorta remains tortuous and partially calcified. Stable prominence of the interstitial markings with normal vascularity. Diffuse osteopenia. No pleural fluid. IMPRESSION: No acute abnormality. Stable mild cardiomegaly and mild chronic interstitial lung disease.  Electronically Signed   By: Beckie Salts M.D.   On: 02/06/2016 10:33   Ct Head Wo Contrast  Result Date: 01/22/2016 CLINICAL DATA:  Facial droop. EXAM: CT HEAD WITHOUT CONTRAST TECHNIQUE: Contiguous axial images were obtained from the base of the skull through the vertex without intravenous contrast. COMPARISON:  08/28/2015 FINDINGS: Brain: Old bilateral basal ganglia lacunar infarcts. There is atrophy and chronic small vessel disease changes. No acute intracranial abnormality. Specifically, no hemorrhage, hydrocephalus, mass lesion, acute infarction, or significant intracranial injury. Vascular: No hyperdense vessel or unexpected calcification. Skull: No acute calvarial abnormality. Sinuses/Orbits: Visualized paranasal sinuses and mastoids clear. Orbital soft tissues unremarkable. Other: None IMPRESSION: Old bilateral basal ganglia lacunar infarcts. No acute intracranial abnormality. Atrophy, chronic microvascular disease. Electronically Signed   By: Charlett Nose M.D.   On: 02/01/2016 10:21      IMPRESSION AND PLAN:   Acute CVA with dysarthria and dysphagia. The patient will be admitted to medical floor, telemetry monitor, neuro check, MRI/MRA of brain, echocardiogram and carotid duplex. Keep nothing by mouth with IV fluid support, and speech study, PT evaluation. Aspirin 300 mg per rectal, until she passes speech study. Continue statin if passed speech study.  Acute renal failure. Start normal saline IV and follow-up BMP.  Hypertension. Continue home hypertension medication.  Chronic diarrhea. Continue him on DuoNeb when necessary and probiotics.   Questionable UTI. Urinalysis showed WBC 6-30.  Follow-up urine culture, given antibiotics if positive.  All the records are reviewed and case discussed with ED provider. Management plans discussed with the patient's son (POA) and they are in agreement.  CODE STATUS: DO NOT RESUSCITATE  TOTAL TIME TAKING CARE OF THIS PATIENT: 52 minutes.     Shaune Pollack M.D on 02/08/2016 at 12:20 PM  Between 7am to 6pm - Pager - [phone]  After 6pm go to [email or web address] - Social research officer, government  [employer] [facility] [place] [facility]  Office  [phone]  CC: Primary care physician; BABAOFF, Lavada Mesi, MD   Note: This dictation was prepared with Dragon dictation along with smaller phrase technology. Any transcriptional errors that result from this process are unintentional.

## 2016-01-30 NOTE — ED Notes (Signed)
Patient transported for CT and Chest x-ray by this RN

## 2016-01-30 NOTE — Progress Notes (Signed)
Advanced Care Plan.  Purpose of Encounter: palliative care. Parties in Attendance:  the patient, her son (POA) and me. Patient's Decisional Capacity: No. Subjective/Patient Story: Slurred speech and weakness. Objective/Medical Story: Martha DialsRuth Schriever  is a 82 y.o. female with a known history of Hypertension, hyperlipidemia, TIA, UTI and dementia. The patient woke up this morning and was unable to take her pills. She was found with slurred speech and trouble swallowing. In addition,  the patient has generalized weakness and incontinence. Per her son, the patient's general condition has been declining recently. She is found slurred speech and right facial droop She's suspected acute CVA. I discussed with her son about palliative care and he agrees to  Palliative care consult.  Goals of Care Determinations:  Palliative care Plan:  Code Status: DO NOT RESUSCITATE  Time spent discussing advance care planning. 18 minutes.

## 2016-01-30 NOTE — ED Notes (Signed)
Patient O2 sat at 90% patient placed on 2 liter via [place]

## 2016-01-30 NOTE — Progress Notes (Signed)
*  PRELIMINARY RESULTS* Echocardiogram 2D Echocardiogram has been performed.  Martha Ridgelikeshia [name] 02/12/2016, 2:47 PM

## 2016-01-30 NOTE — ED Notes (Signed)
Patient son at bedside, reports that patient has been generally not feeling well x 1 week. Yesterday Patient seemed to be a little worse and had diarrhea all day. Today facility told son that patient was harder to wake up and her speech seemed a little off.

## 2016-01-30 NOTE — ED Triage Notes (Signed)
[facility] and cough and congestion. EMS reports strong smell of urine and "junky" lung sounds.

## 2016-01-30 NOTE — ED Notes (Signed)
Patient returned from imaging.

## 2016-01-31 ENCOUNTER — Inpatient Hospital Stay: Payer: Medicare Other

## 2016-01-31 DIAGNOSIS — I639 Cerebral infarction, unspecified: Principal | ICD-10-CM

## 2016-01-31 LAB — LIPID PANEL
CHOL/HDL RATIO: 5.4 ratio
CHOLESTEROL: 174 mg/dL (ref 0–200)
HDL: 32 mg/dL — AB (ref >40)
LDL Cholesterol: 109 mg/dL — ABNORMAL HIGH (ref 0–99)
Triglycerides: 163 mg/dL — ABNORMAL HIGH (ref <150)
VLDL: 33 mg/dL (ref 0–40)

## 2016-01-31 LAB — CBC WITH DIFFERENTIAL/PLATELET
BASOS PCT: 0 %
Basophils Absolute: 0 10*3/uL (ref 0–0.1)
EOS ABS: 0 10*3/uL (ref 0–0.7)
EOS PCT: 0 %
HCT: 33.4 % — ABNORMAL LOW (ref 35.0–47.0)
Hemoglobin: 11 g/dL — ABNORMAL LOW (ref 12.0–16.0)
LYMPHS ABS: 0.3 10*3/uL — AB (ref 1.0–3.6)
Lymphocytes Relative: 3 %
MCH: 27.6 pg (ref 26.0–34.0)
MCHC: 32.9 g/dL (ref 32.0–36.0)
MCV: 83.9 fL (ref 80.0–100.0)
MONO ABS: 0.3 10*3/uL (ref 0.2–0.9)
MONOS PCT: 2 %
Neutro Abs: 11.9 10*3/uL — ABNORMAL HIGH (ref 1.4–6.5)
Neutrophils Relative %: 95 %
PLATELETS: 181 10*3/uL (ref 150–440)
RBC: 3.99 MIL/uL (ref 3.80–5.20)
RDW: 15.3 % — AB (ref 11.5–14.5)
WBC: 12.6 10*3/uL — ABNORMAL HIGH (ref 3.6–11.0)

## 2016-01-31 LAB — ECHOCARDIOGRAM COMPLETE
HEIGHTINCHES: 59 in
WEIGHTICAEL: 1954 [oz_av]

## 2016-01-31 MED ORDER — HYDRALAZINE HCL 20 MG/ML IJ SOLN: 10.0000 mg | Freq: Four times a day (QID) | INTRAMUSCULAR | Status: DC | PRN

## 2016-01-31 MED ORDER — CEFEPIME HCL 2 G IJ SOLR
2.0000 g | Freq: Every day | INTRAMUSCULAR | Status: DC
  Administered 2016-02-01: 2 g via INTRAVENOUS
  Filled 2016-01-31 (×2): qty 2

## 2016-01-31 MED ORDER — VANCOMYCIN HCL IN DEXTROSE 750-5 MG/150ML-% IV SOLN
750.0000 mg | INTRAVENOUS | Status: AC
  Administered 2016-01-31: 21:00:00 750 mg via INTRAVENOUS
  Filled 2016-01-31: qty 150

## 2016-01-31 MED ORDER — PANTOPRAZOLE SODIUM 40 MG IV SOLR
40.0000 mg | Freq: Two times a day (BID) | INTRAVENOUS | Status: DC
  Administered 2016-01-31 – 2016-02-02 (×4): 40 mg via INTRAVENOUS
  Filled 2016-01-31 (×4): qty 40

## 2016-01-31 MED ORDER — VANCOMYCIN HCL IN DEXTROSE 750-5 MG/150ML-% IV SOLN
750.0000 mg | INTRAVENOUS | Status: DC
  Administered 2016-02-01: 750 mg via INTRAVENOUS
  Filled 2016-01-31 (×2): qty 150

## 2016-01-31 NOTE — Care Management Important Message (Signed)
Important Message  Patient Details  Name: Martha Hunter MRN: [id number] Date of Birth: [birth year]   Medicare Important Message Given:  Yes    Jerrell Mangel A, RN 01/31/2016, 4:47 PM

## 2016-01-31 NOTE — Progress Notes (Signed)
PT Cancellation Note  Patient Details Name: Martha DialsRuth Barrack MRN: [id number] DOB: [birth year]   Cancelled Treatment:    Reason Eval/Treat Not Completed: Patient's level of consciousness.  Too lethargic for therapy and will try later as time and pt allow.   Ivar DrapeStout, Angelee E 01/31/2016, 11:29 AM   Samul Dadauth Ariel Dimitri, PT MS Acute Rehab Dept. Number: [facility] [phone] and [facility] [phone]

## 2016-01-31 NOTE — Progress Notes (Signed)
Dr Amado CoeGouru made aware of CBC result, orders to be placed

## 2016-01-31 NOTE — Progress Notes (Signed)
Dr Amado CoeGouru made aware that MRI result is in computer

## 2016-01-31 NOTE — Plan of Care (Signed)
Problem: Nutrition: Goal: Risk of aspiration will decrease Outcome: Not Progressing Currently NPO. Pt did not pass RN swallow screen. SLP eval. Pending.   Problem: Tissue Perfusion: Goal: Complications of Ischemic Stroke will be minimized (choose ONE based on patient diagnosis) Outcome: Progressing NIH 10. Right sided weakness/facial droop. MRI pending as well as neuro consult.

## 2016-01-31 NOTE — Progress Notes (Signed)
Nurse tech reports black colored stool, Dr Amado CoeGouru made aware via pager

## 2016-01-31 NOTE — Progress Notes (Signed)
On call MRI called, made aware that Dr Amado CoeGouru wants MRI done today, on call states that they will be here with in the next hour

## 2016-01-31 NOTE — Progress Notes (Signed)
[facility] - [employer] at [facility]   PATIENT NAME: Martha Hunter    MR#:  [id number]  DATE OF BIRTH:  05/31/1928  SUBJECTIVE:  CHIEF COMPLAINT:  Patient is lethargic but arousable and follows verbal commands Poor historian given history of dementia and slurry speech   REVIEW OF SYSTEMS:  Review of systems unobtainable, patient has chronic dementia and now slurry speech  DRUG ALLERGIES:   Allergies  Allergen Reactions  . Niacin And Related   . Nitrates, Organic Other (See Comments)    Reaction: unknown  . Penicillins Other (See Comments)    Has patient had a PCN reaction causing immediate rash, facial/tongue/throat swelling, SOB or lightheadedness with hypotension: unknown  Has patient had a PCN reaction causing severe rash involving mucus membranes or skin necrosis: unknown Has patient had a PCN reaction that required hospitalization: unknown Has patient had a PCN reaction occurring within the last 10 years: unknown If all of the above answers are "NO", then may proceed with Cephalosporin use.  Reactions not listed on MAR  . Sulfa Antibiotics Other (See Comments)    Reaction: unknown     VITALS:  Blood pressure 138/60, pulse 80, temperature 98.7 F (37.1 C), temperature source Axillary, resp. rate (!) 22, height 4\' 11"  (1.499 m), weight 55.4 kg (122 lb 2 oz), SpO2 97 %.  PHYSICAL EXAMINATION:  GENERAL:  82 y.o.-year-old patient lying in the bed with no acute distress.  EYES: Pupils equal, round, reactive to light and accommodation. No scleral icterus.  HEENT: Head atraumatic, normocephalic. Oropharynx and nasopharynx clear.  NECK:  Supple, no jugular venous distention. No thyroid enlargement, no tenderness.  LUNGS: Normal breath sounds bilaterally, no wheezing, rales,rhonchi or crepitation. No use of accessory muscles of respiration.  CARDIOVASCULAR: S1, S2 normal. No murmurs, rubs, or gallops.  ABDOMEN: Soft, nontender, nondistended. Bowel  sounds present. No organomegaly or mass.  EXTREMITIES: No pedal edema, cyanosis, or clubbing.  NEUROLOGIC: Cranial2-12 are grossly intact. Motor 4 out of 5 in all extremities Sensation intact. Gait not checked.  PSYCHIATRIC: The patient is oriented x 1.  SKIN: No obvious rash, lesion, or ulcer.    LABORATORY PANEL:   CBC  Recent Labs Lab 02-18-16 0948  WBC 9.5  HGB 12.5  HCT 37.3  PLT 200   ------------------------------------------------------------------------------------------------------------------  Chemistries   Recent Labs Lab 02-18-2016 0948  NA 138  K 4.7  CL 105  CO2 24  GLUCOSE 91  BUN 25*  CREATININE 1.55*  CALCIUM 9.6   ------------------------------------------------------------------------------------------------------------------  Cardiac Enzymes  Recent Labs Lab 2016-02-18 0948  TROPONINI <0.03   ------------------------------------------------------------------------------------------------------------------  RADIOLOGY:  Dg Chest 2 View  Result Date: 02-18-2016 CLINICAL DATA:  Generalized weakness, cough and chest congestion. EXAM: CHEST  2 VIEW COMPARISON:  07/18/2012. FINDINGS: The cardiac silhouette remains mildly enlarged and the aorta remains tortuous and partially calcified. Stable prominence of the interstitial markings with normal vascularity. Diffuse osteopenia. No pleural fluid. IMPRESSION: No acute abnormality. Stable mild cardiomegaly and mild chronic interstitial lung disease. Electronically Signed   By: Beckie Salts M.D.   On: 02/18/2016 10:33   Ct Head Wo Contrast  Result Date: 2016/02/18 CLINICAL DATA:  Facial droop. EXAM: CT HEAD WITHOUT CONTRAST TECHNIQUE: Contiguous axial images were obtained from the base of the skull through the vertex without intravenous contrast. COMPARISON:  08/28/2015 FINDINGS: Brain: Old bilateral basal ganglia lacunar infarcts. There is atrophy and chronic small vessel disease changes. No acute  intracranial abnormality. Specifically, no hemorrhage, hydrocephalus, mass  lesion, acute infarction, or significant intracranial injury. Vascular: No hyperdense vessel or unexpected calcification. Skull: No acute calvarial abnormality. Sinuses/Orbits: Visualized paranasal sinuses and mastoids clear. Orbital soft tissues unremarkable. Other: None IMPRESSION: Old bilateral basal ganglia lacunar infarcts. No acute intracranial abnormality. Atrophy, chronic microvascular disease. Electronically Signed   By: Charlett Nose M.D.   On: 01/23/2016 10:21   Mr Brain Wo Contrast  Result Date: 01/31/2016 CLINICAL DATA:  Acute onset of slurred speech and difficulty swallowing. Dementia up. EXAM: MRI HEAD WITHOUT CONTRAST MRA HEAD WITHOUT CONTRAST TECHNIQUE: Multiplanar, multiecho pulse sequences of the brain and surrounding structures were obtained without intravenous contrast. Angiographic images of the head were obtained using MRA technique without contrast. COMPARISON:  CT 01/23/2016.  MRI 05/29/2015. FINDINGS: MRI HEAD FINDINGS Brain: 1 x 1.5 cm acute infarction in the left basal ganglia. No other acute infarction. Chronic small-vessel ischemic changes are present throughout the pons. There are old small vessel infarctions. Old infarctions are present within the basal ganglia bilaterally. Extensive chronic small-vessel ischemic changes affect the cerebral hemispheric white matter bilaterally. No mass lesion, hemorrhage, hydrocephalus or extra-axial collection. Few foci of hemosiderin deposition related old infarctions. Vascular: Major vessels at the base of the brain show flow. Skull and upper cervical spine: Negative Sinuses/Orbits: Clear/normal Other: None significant MRA HEAD FINDINGS Both internal carotid arteries are widely patent into the brain. No siphon stenosis. The anterior and middle cerebral vessels are patent without proximal stenosis, aneurysm or vascular malformation. More distal branch vessels show  atherosclerotic irregularity. Both vertebral arteries are patent with the left being dominant. No basilar stenosis. Superior cerebellar and posterior cerebral arteries are patent. More distal PCA branches show atherosclerotic irregularity. IMPRESSION: 1 x 1.5 cm acute infarction in the left basal ganglia. Extensive old ischemic changes affecting the pons, cerebellum, basal ganglia and cerebral hemispheric white matter. Intracranial MR angiography does not show any large vessel occlusion or correctable proximal stenosis. Distal vessel atherosclerotic narrowing and irregularity diffusely. Electronically Signed   By: Paulina Fusi M.D.   On: 01/31/2016 13:24   US Carotid Bilateral (at [facility] And Ap Only)  Result Date: 01/31/2016 CLINICAL DATA:  Slurred speech, hypertension, TIA. EXAM: BILATERAL CAROTID DUPLEX ULTRASOUND TECHNIQUE: Wallace Cullens scale imaging, color Doppler and duplex ultrasound were performed of bilateral carotid and vertebral arteries in the neck. COMPARISON:  None. FINDINGS: Criteria: Quantification of carotid stenosis is based on velocity parameters that correlate the residual internal carotid diameter with NASCET-based stenosis levels, using the diameter of the distal internal carotid lumen as the denominator for stenosis measurement. The following velocity measurements were obtained: RIGHT ICA:  84/19 cm/sec CCA:  93/11 cm/sec SYSTOLIC ICA/CCA RATIO:  0.9 DIASTOLIC ICA/CCA RATIO:  1.7 ECA:  111 cm/sec LEFT ICA:  112/26 cm/sec CCA:  85/8 cm/sec SYSTOLIC ICA/CCA RATIO:  1.3 DIASTOLIC ICA/CCA RATIO:  3.3 ECA:  127 cm/sec RIGHT CAROTID ARTERY: There is a mild to moderate amount of calcified plaque at the level of the distal bulb proximal right ICA. Based on velocities, estimated right ICA stenosis is less than 50%. RIGHT VERTEBRAL ARTERY: Antegrade flow with normal waveform and velocity. LEFT CAROTID ARTERY: There is a mild amount of calcified plaque at the level of the carotid bulb and proximal ICA. Based  on velocities, estimated left ICA stenosis is less than 50%. LEFT VERTEBRAL ARTERY: Antegrade flow with normal waveform and velocity. IMPRESSION: Plaque at the level of both carotid bulbs and proximal internal carotid arteries, right greater than left. No significant carotid stenosis identified with  estimated bilateral ICA stenoses of less than 50%. Electronically Signed   By: Irish LackGlenn  Yamagata M.D.   On: 01/31/2016 11:37   Mr Maxine GlennMra Head/brain WUWo Cm  Result Date: 01/31/2016 CLINICAL DATA:  Acute onset of slurred speech and difficulty swallowing. Dementia up. EXAM: MRI HEAD WITHOUT CONTRAST MRA HEAD WITHOUT CONTRAST TECHNIQUE: Multiplanar, multiecho pulse sequences of the brain and surrounding structures were obtained without intravenous contrast. Angiographic images of the head were obtained using MRA technique without contrast. COMPARISON:  CT 03/16/2015.  MRI 05/29/2015. FINDINGS: MRI HEAD FINDINGS Brain: 1 x 1.5 cm acute infarction in the left basal ganglia. No other acute infarction. Chronic small-vessel ischemic changes are present throughout the pons. There are old small vessel infarctions. Old infarctions are present within the basal ganglia bilaterally. Extensive chronic small-vessel ischemic changes affect the cerebral hemispheric white matter bilaterally. No mass lesion, hemorrhage, hydrocephalus or extra-axial collection. Few foci of hemosiderin deposition related old infarctions. Vascular: Major vessels at the base of the brain show flow. Skull and upper cervical spine: Negative Sinuses/Orbits: Clear/normal Other: None significant MRA HEAD FINDINGS Both internal carotid arteries are widely patent into the brain. No siphon stenosis. The anterior and middle cerebral vessels are patent without proximal stenosis, aneurysm or vascular malformation. More distal branch vessels show atherosclerotic irregularity. Both vertebral arteries are patent with the left being dominant. No basilar stenosis. Superior  cerebellar and posterior cerebral arteries are patent. More distal PCA branches show atherosclerotic irregularity. IMPRESSION: 1 x 1.5 cm acute infarction in the left basal ganglia. Extensive old ischemic changes affecting the pons, cerebellum, basal ganglia and cerebral hemispheric white matter. Intracranial MR angiography does not show any large vessel occlusion or correctable proximal stenosis. Distal vessel atherosclerotic narrowing and irregularity diffusely. Electronically Signed   By: Paulina FusiMark  Shogry M.D.   On: 01/31/2016 13:24    EKG:   Orders placed or performed during the hospital encounter of 05-18-15  . ED EKG  . ED EKG    ASSESSMENT AND PLAN:    # Acute CVA with dysarthria and dysphagia and chronic dementia  telemetry monitor  neuro check MRI/MRA of brain with acute 1 x 1.5 cm infarct in the left basal ganglia. ICA with no significant stenosis  echocardiogram results are pending   carotid duplex with no significant stenosis Keep nothing by mouth with IV fluid support,  speech study, PT evaluation and OT evaluation is pending Aspirin 300 mg per rectal, until she passes speech study.  Continue statin if passed speech study. LDL 109  #Acute renal failure. Start normal saline IV and follow-up BMP.  #Hypertension. Patient is nothing by mouth Allow permissive hypertension for the first 24 hours after that we will provide hydralazine IV every 6 hours as needed while patient is nothing by mouth  #Chronic diarrhea. Continue  when necessary and probiotics.   #Questionable UTI. Urinalysis showed WBC 6-30.  Follow-up urine culture,   will start antibiotics if positive.    All the records are reviewed and case discussed with Care Management/Social Workerr. Management plans discussed with the patient's son [name] and they are in agreement.  CODE STATUS: dnr ,Son is the healthcare power of attorney  TOTAL TIME TAKING CARE OF THIS PATIENT: 35 minutes.   POSSIBLE D/C IN 2   DAYS, DEPENDING ON CLINICAL CONDITION.  Note: This dictation was prepared with Dragon dictation along with smaller phrase technology. Any transcriptional errors that result from this process are unintentional.   Ramonita LabGouru, Mikalah Skyles M.D on 01/31/2016 at 2:16  PM  Between 7am to 6pm - Pager - [phone] After 6pm go to [email or web address] - password [email or web address]  [facility]  Office  [phone]  CC: Primary care physician; BABAOFF, Lavada MesiMARC E, MD

## 2016-01-31 NOTE — Consult Note (Signed)
Reason for Consult:aphasia and R facial droop  Referring Physician: Dr. Amado Coe  CC: Aphasia and  R facial droop   HPI: Martha Hunter is an 82 y.o. female with a known history of Hypertension, hyperlipidemia, TIA, UTI and dementia. The patient woke up yesterday and was unable to take her pills. She was found with slurred speech and trouble swallowing. The patient is demented and not a good historian. She was noticed right-sided facial droop and slurred speech in the ED. No abnormalities on [facility]   Past Medical History:  Diagnosis Date  . Bladder instability   . Bladder spasm   . CAP (community acquired pneumonia)    a. 07/2014.  Marland Kitchen Cystocele   . Depression   . History of TIA (transient ischemic attack)    a. on plavix chronically.  . Hypercholesteremia   . Hyperlipidemia   . Hypertension   . Rectocele   . Staph infection   . UTI (lower urinary tract infection)   . Vaginal atrophy     Past Surgical History:  Procedure Laterality Date  . DILATION AND CURETTAGE OF UTERUS    . EYE SURGERY    . None      Family History  Problem Relation Age of Onset  . Congestive Heart Failure Mother   . CAD Father   . CAD      parents and 9 siblings all died with CAD.  Marland Kitchen Heart disease Sister   . Heart disease Brother   . Cancer Neg Hx   . Diabetes Neg Hx     Social History:  reports that she has quit smoking. She has never used smokeless tobacco. She reports that she does not drink alcohol or use drugs.  Allergies  Allergen Reactions  . Niacin And Related   . Nitrates, Organic Other (See Comments)    Reaction: unknown  . Penicillins Other (See Comments)    Has patient had a PCN reaction causing immediate rash, facial/tongue/throat swelling, SOB or lightheadedness with hypotension: unknown  Has patient had a PCN reaction causing severe rash involving mucus membranes or skin necrosis: unknown Has patient had a PCN reaction that required hospitalization: unknown Has patient had a PCN  reaction occurring within the last 10 years: unknown If all of the above answers are "NO", then may proceed with Cephalosporin use.  Reactions not listed on MAR  . Sulfa Antibiotics Other (See Comments)    Reaction: unknown     Medications: I have reviewed the patient's current medications.  ROS: Unable to obtain due to dysarthria/aphasia   Physical Examination: Blood pressure (!) 138/54, pulse 80, temperature 98.7 F (37.1 C), temperature source Tympanic, resp. rate (!) 22, height 4\' 11"  (1.499 m), weight 55.4 kg (122 lb 2 oz), SpO2 97 %.   Neurological Examination Mental Status: Alert to name. Cranial Nerves: II: Discs flat bilaterally; Visual fields grossly normal, pupils equal, round, reactive to light and accommodation III,IV, VI: ptosis not present, extra-ocular motions intact bilaterally V,VII: R facial droop VIII: hearing normal bilaterally IX,X: gag reflex present XI: bilateral shoulder shrug XII: midline tongue extension Motor: Right : Upper extremity   4+/5    Left:     Upper extremity   4+/5  Lower extremity   4+/5     Lower extremity   4+/5 Tone and bulk:normal tone throughout; no atrophy noted Sensory: Pinprick and light touch intact throughout, bilaterally Deep Tendon Reflexes: 1+ and symmetric throughout Plantars: Right: downgoing   Left: downgoing Cerebellar: normal finger-to-nose, normal  rapid alternating movements and normal heel-to-shin test Gait: not tested       Laboratory Studies:   Basic Metabolic Panel:  Recent Labs Lab 02/03/2016 0948  NA 138  K 4.7  CL 105  CO2 24  GLUCOSE 91  BUN 25*  CREATININE 1.55*  CALCIUM 9.6    Liver Function Tests: No results for input(s): AST, ALT, ALKPHOS, BILITOT, PROT, ALBUMIN in the last 168 hours. No results for input(s): LIPASE, AMYLASE in the last 168 hours. No results for input(s): AMMONIA in the last 168 hours.  CBC:  Recent Labs Lab 02/14/2016 0948  WBC 9.5  HGB 12.5  HCT 37.3  MCV  83.1  PLT 200    Cardiac Enzymes:  Recent Labs Lab 01/16/2016 0948  TROPONINI <0.03    BNP: Invalid input(s): POCBNP  CBG: No results for input(s): GLUCAP in the last 168 hours.  Microbiology: Results for orders placed or performed during the hospital encounter of 01/21/2016  MRSA PCR Screening     Status: Abnormal   Collection Time: 02/02/2016  4:24 PM  Result Value Ref Range Status   MRSA by PCR POSITIVE (A) NEGATIVE Final    Comment:        The GeneXpert MRSA Assay (FDA approved for NASAL specimens only), is one component of a comprehensive MRSA colonization surveillance program. It is not intended to diagnose MRSA infection nor to guide or monitor treatment for MRSA infections. RESULT CALLED TO, READ BACK BY AND VERIFIED WITH: MADDIE BENTON ON 01/20/2016 AT 1738     Coagulation Studies:  Recent Labs  01/20/2016 0948  LABPROT 13.4  INR 1.02    Urinalysis:  Recent Labs Lab 02/12/2016 0948  COLORURINE YELLOW*  LABSPEC 1.013  PHURINE 5.0  GLUCOSEU NEGATIVE  HGBUR MODERATE*  BILIRUBINUR NEGATIVE  KETONESUR 5*  PROTEINUR 100*  NITRITE NEGATIVE  LEUKOCYTESUR SMALL*    Lipid Panel:     Component Value Date/Time   CHOL 174 01/31/2016 0450   CHOL 174 03/14/2013 0623   TRIG 163 (H) 01/31/2016 0450   TRIG 126 03/14/2013 0623   HDL 32 (L) 01/31/2016 0450   HDL 37 (L) 03/14/2013 0623   CHOLHDL 5.4 01/31/2016 0450   VLDL 33 01/31/2016 0450   VLDL 25 03/14/2013 0623   LDLCALC 109 (H) 01/31/2016 0450   LDLCALC 112 (H) 03/14/2013 0623    HgbA1C:  Lab Results  Component Value Date   HGBA1C 5.3 05/30/2015    Urine Drug Screen:  No results found for: LABOPIA, COCAINSCRNUR, LABBENZ, AMPHETMU, THCU, LABBARB  Alcohol Level: No results for input(s): ETH in the last 168 hours.  Other results: EKG: normal EKG, normal sinus rhythm, unchanged from previous tracings.  Imaging: Dg Chest 2 View  Result Date: 01/29/2016 CLINICAL DATA:  Generalized weakness,  cough and chest congestion. EXAM: CHEST  2 VIEW COMPARISON:  07/18/2012. FINDINGS: The cardiac silhouette remains mildly enlarged and the aorta remains tortuous and partially calcified. Stable prominence of the interstitial markings with normal vascularity. Diffuse osteopenia. No pleural fluid. IMPRESSION: No acute abnormality. Stable mild cardiomegaly and mild chronic interstitial lung disease. Electronically Signed   By: Beckie SaltsSteven  Reid M.D.   On: 02/08/2016 10:33   Ct Head Wo Contrast  Result Date: 02/13/2016 CLINICAL DATA:  Facial droop. EXAM: CT HEAD WITHOUT CONTRAST TECHNIQUE: Contiguous axial images were obtained from the base of the skull through the vertex without intravenous contrast. COMPARISON:  08/28/2015 FINDINGS: Brain: Old bilateral basal ganglia lacunar infarcts. There is atrophy and chronic  small vessel disease changes. No acute intracranial abnormality. Specifically, no hemorrhage, hydrocephalus, mass lesion, acute infarction, or significant intracranial injury. Vascular: No hyperdense vessel or unexpected calcification. Skull: No acute calvarial abnormality. Sinuses/Orbits: Visualized paranasal sinuses and mastoids clear. Orbital soft tissues unremarkable. Other: None IMPRESSION: Old bilateral basal ganglia lacunar infarcts. No acute intracranial abnormality. Atrophy, chronic microvascular disease. Electronically Signed   By: Charlett NoseKevin  Dover M.D.   On: 05/30/2015 10:21   [place] Carotid Bilateral (at [facility] And [facility] Only)  Result Date: 01/31/2016 CLINICAL DATA:  Slurred speech, hypertension, TIA. EXAM: BILATERAL CAROTID DUPLEX ULTRASOUND TECHNIQUE: Wallace CullensGray scale imaging, color Doppler and duplex ultrasound were performed of bilateral carotid and vertebral arteries in the neck. COMPARISON:  None. FINDINGS: Criteria: Quantification of carotid stenosis is based on velocity parameters that correlate the residual internal carotid diameter with NASCET-based stenosis levels, using the diameter of the distal  internal carotid lumen as the denominator for stenosis measurement. The following velocity measurements were obtained: RIGHT ICA:  84/19 cm/sec CCA:  93/11 cm/sec SYSTOLIC ICA/CCA RATIO:  0.9 DIASTOLIC ICA/CCA RATIO:  1.7 ECA:  111 cm/sec LEFT ICA:  112/26 cm/sec CCA:  85/8 cm/sec SYSTOLIC ICA/CCA RATIO:  1.3 DIASTOLIC ICA/CCA RATIO:  3.3 ECA:  127 cm/sec RIGHT CAROTID ARTERY: There is a mild to moderate amount of calcified plaque at the level of the distal bulb proximal right ICA. Based on velocities, estimated right ICA stenosis is less than 50%. RIGHT VERTEBRAL ARTERY: Antegrade flow with normal waveform and velocity. LEFT CAROTID ARTERY: There is a mild amount of calcified plaque at the level of the carotid bulb and proximal ICA. Based on velocities, estimated left ICA stenosis is less than 50%. LEFT VERTEBRAL ARTERY: Antegrade flow with normal waveform and velocity. IMPRESSION: Plaque at the level of both carotid bulbs and proximal internal carotid arteries, right greater than left. No significant carotid stenosis identified with estimated bilateral ICA stenoses of less than 50%. Electronically Signed   By: Irish LackGlenn  Yamagata M.D.   On: 01/31/2016 11:37     Assessment/Plan: with a known history of Hypertension, hyperlipidemia, TIA, UTI and dementia. The patient woke up yesterday and was unable to take her pills. She was found with slurred speech and trouble swallowing. The patient is demented and not a good historian. She was noticed right-sided facial droop and slurred speech in the ED. No abnormalities on CTH   UTI might be contributing to symptoms HTN, HLD are risk factors Anti platelet therapy MRI pending for today.  Pt/OT 01/31/2016, 1:16 PM

## 2016-01-31 NOTE — Progress Notes (Signed)
Pharmacy Antibiotic Note  Martha Hunter is a 82 y.o. female admitted on 01/17/2016 with acute CVA ordered abx for possible PNA.  Pharmacy has been consulted for vancomycin and cefepime dosing.  Plan: Vancomycin 750 mg iv q 36 hours with stacked dosing. Will tentatively plan to check a random level before administering 3rd dose.   Cefepime 2 g iv q 24 hours.   Height: 4\' 11"  (149.9 cm) Weight: 122 lb 2 oz (55.4 kg) IBW/kg (Calculated) : 43.2  Temp (24hrs), Avg:98.7 F (37.1 C), Min:98.3 F (36.8 C), Max:99.2 F (37.3 C)   Recent Labs Lab 01/25/2016 0948 01/31/16 1805  WBC 9.5 12.6*  CREATININE 1.55*  --     Estimated Creatinine Clearance: 20.1 mL/min (by C-G formula based on SCr of 1.55 mg/dL (H)).    Allergies  Allergen Reactions  . Niacin And Related   . Nitrates, Organic Other (See Comments)    Reaction: unknown  . Penicillins Other (See Comments)    Has patient had a PCN reaction causing immediate rash, facial/tongue/throat swelling, SOB or lightheadedness with hypotension: unknown  Has patient had a PCN reaction causing severe rash involving mucus membranes or skin necrosis: unknown Has patient had a PCN reaction that required hospitalization: unknown Has patient had a PCN reaction occurring within the last 10 years: unknown If all of the above answers are "NO", then may proceed with Cephalosporin use.  Reactions not listed on MAR  . Sulfa Antibiotics Other (See Comments)    Reaction: unknown     Antimicrobials this admission: cefepime 12/17 >>  vancomcyin 12/17 >>   Dose adjustments this admission:   Microbiology results: BCx: sent 12/16 UCx: sent   12/16 MRSA PCR: positive  Thank you for allowing pharmacy to be a part of this patient's care.  Luisa HartChristy, Kerriann Kamphuis D 01/31/2016 7:17 PM

## 2016-01-31 NOTE — Progress Notes (Signed)
Dr Amado CoeGouru made aware that son is concerned with pt being so lethargic and so different from her baseline, Dr Amado CoeGouru in to assess pt, Dr Amado CoeGouru will call son

## 2016-01-31 NOTE — Progress Notes (Signed)
Per Dr Amado CoeGouru CBC now and in am, GI consult, protonix 40mg  IV BID, discontinue heparin and ASA, reeval in the am

## 2016-02-01 DIAGNOSIS — Z7189 Other specified counseling: Secondary | ICD-10-CM

## 2016-02-01 LAB — CBC WITH DIFFERENTIAL/PLATELET
BASOS ABS: 0 10*3/uL (ref 0–0.1)
BASOS PCT: 0 %
EOS ABS: 0 10*3/uL (ref 0–0.7)
Eosinophils Relative: 0 %
HCT: 31.2 % — ABNORMAL LOW (ref 35.0–47.0)
Hemoglobin: 10.3 g/dL — ABNORMAL LOW (ref 12.0–16.0)
Lymphocytes Relative: 4 %
Lymphs Abs: 0.5 10*3/uL — ABNORMAL LOW (ref 1.0–3.6)
MCH: 27.5 pg (ref 26.0–34.0)
MCHC: 33.1 g/dL (ref 32.0–36.0)
MCV: 83.2 fL (ref 80.0–100.0)
MONO ABS: 0.3 10*3/uL (ref 0.2–0.9)
MONOS PCT: 3 %
NEUTROS ABS: 10.7 10*3/uL — AB (ref 1.4–6.5)
Neutrophils Relative %: 93 %
PLATELETS: 183 10*3/uL (ref 150–440)
RBC: 3.75 MIL/uL — ABNORMAL LOW (ref 3.80–5.20)
RDW: 15.5 % — AB (ref 11.5–14.5)
WBC: 11.5 10*3/uL — ABNORMAL HIGH (ref 3.6–11.0)

## 2016-02-01 LAB — CREATININE, SERUM
Creatinine, Ser: 1.24 mg/dL — ABNORMAL HIGH (ref 0.44–1.00)
GFR calc Af Amer: 45 mL/min — ABNORMAL LOW (ref >60)
GFR calc non Af Amer: 38 mL/min — ABNORMAL LOW (ref >60)

## 2016-02-01 LAB — HEMOGLOBIN A1C
HEMOGLOBIN A1C: 5.3 % (ref 4.8–5.6)
MEAN PLASMA GLUCOSE: 105 mg/dL

## 2016-02-01 MED ORDER — MORPHINE SULFATE (PF) 4 MG/ML IV SOLN
2.0000 mg | INTRAVENOUS | Status: DC | PRN
  Administered 2016-02-02: 2 mg via INTRAVENOUS
  Filled 2016-02-01: qty 1

## 2016-02-01 MED ORDER — ACETAMINOPHEN 650 MG RE SUPP
650.0000 mg | Freq: Once | RECTAL | Status: AC
  Administered 2016-02-01: 650 mg via RECTAL

## 2016-02-01 NOTE — Evaluation (Signed)
Occupational Therapy Evaluation Patient Details Name: Martha Hunter MRN: [id number] DOB: 05-21-1928 Today's Date: 02/01/2016    History of Present Illness Pt. is an 82 y.o. female who [facility]A and Facial droop. Pt. PMHx includes: Hyperlipidemia, TIA, UTI, and Dementia.   Clinical Impression   Pt. is an 82 y.o. Female who was admitted. Pt. Presents with lethargy, impaired cognition, weakness, impaired functional mobility for ADLs,  and decreased activity tolerance which hinder her ability to complete ADl and IADL functioning. Pt. Could benefit from skilled OT services for ADL training, A/E training, UE there. Ex., cognitive compensatory strategies, neuromuscular re ed, and pt./family education. Pt. Could benefit from SNF level of care upon discharge. Pt. Could benefit from follow-up OT services upon discharge.     Follow Up Recommendations  SNF    Equipment Recommendations       Recommendations for Other Services       Precautions / Restrictions Restrictions Weight Bearing Restrictions: No                                                     ADL Overall ADL's : Needs assistance/impaired                                       General ADL Comments: Pt. is total assist for ADL tasks, secondary lethargy      Vision     Perception     Praxis      Pertinent Vitals/Pain Pain Assessment: 0-10 Pain Score: 0-No pain     Hand Dominance     Extremity/Trunk Assessment Upper Extremity Assessment Upper Extremity Assessment: Difficult to assess due to impaired cognition           Communication     Cognition Arousal/Alertness: Lethargic Behavior During Therapy: Flat affect Overall Cognitive Status: Difficult to assess                 General Comments: Pt. very lethargic throughout session.   General Comments       Exercises       Shoulder Instructions      Home Living  Family/patient expects to be discharged to:: Assisted living                                        Prior Functioning/Environment Level of Independence: Needs assistance    ADL's / Homemaking Assistance Needed: Pt. son reports pt. required assist for morning ADLs, and walked to the dining area for meals.            OT Problem List: Decreased strength;Decreased activity tolerance;Decreased cognition;Decreased coordination;Decreased knowledge of use of DME or AE;Impaired UE functional use   OT Treatment/Interventions: Self-care/ADL training;Therapeutic exercise;Therapeutic activities;Neuromuscular education;Patient/family education;Manual therapy    OT Goals(Current goals can be found in the care plan section) Acute Rehab OT Goals Patient Stated Goal: To feel better OT Goal Formulation: With patient Potential to Achieve Goals: Good  OT Frequency: Min 1X/week   Barriers to D/C:            Co-evaluation  End of Session    Activity Tolerance: Patient tolerated treatment well Patient left: in bed;with call bell/phone within reach;with bed alarm set;with family/visitor present   Time: 0940-1000 OT Time Calculation (min): 20 min Charges:  OT General Charges $OT Visit: 1 Procedure OT Evaluation $OT Eval Low Complexity: 1 Procedure G-Codes:    Olegario MessierElaine Itzabella Sorrels, MS, OTR/L 02/01/2016, 10:38 AM

## 2016-02-01 NOTE — Progress Notes (Signed)
[facility] - [place] at [facility]   PATIENT NAME: Martha DialsRuth Gunnoe    MR#:  [id number]  DATE OF BIRTH:  02-25-1928  SUBJECTIVE:  CHIEF COMPLAINT:  Patient is lethargic Poor historian given history of dementia  Febrile today  REVIEW OF SYSTEMS:  Review of systems unobtainable, patient has chronic dementia and now slurry speech  DRUG ALLERGIES:   Allergies  Allergen Reactions  . Niacin And Related   . Nitrates, Organic Other (See Comments)    Reaction: unknown  . Penicillins Other (See Comments)    Has patient had a PCN reaction causing immediate rash, facial/tongue/throat swelling, SOB or lightheadedness with hypotension: unknown  Has patient had a PCN reaction causing severe rash involving mucus membranes or skin necrosis: unknown Has patient had a PCN reaction that required hospitalization: unknown Has patient had a PCN reaction occurring within the last 10 years: unknown If all of the above answers are "NO", then may proceed with Cephalosporin use.  Reactions not listed on MAR  . Sulfa Antibiotics Other (See Comments)    Reaction: unknown     VITALS:  Blood pressure (!) 154/63, pulse 80, temperature 100.2 F (37.9 C), temperature source Oral, resp. rate (!) 22, height 4\' 11"  (1.499 m), weight 55.4 kg (122 lb 2 oz), SpO2 96 %.  PHYSICAL EXAMINATION:  GENERAL:  82 y.o.-year-old patient lying in the bed with no acute distress.  EYES: Pupils equal, round, reactive to light and accommodation. No scleral icterus.  HEENT: Head atraumatic, normocephalic. Oropharynx and nasopharynx clear.  NECK:  Supple, no jugular venous distention. No thyroid enlargement, no tenderness.  LUNGS: Diminished breath sounds bilaterally, no wheezing, rales,rhonchi or crepitation. No use of accessory muscles of respiration.  CARDIOVASCULAR: S1, S2 normal. No murmurs, rubs, or gallops.  ABDOMEN: Soft, nontender, nondistended. Bowel sounds present. No organomegaly or mass.   EXTREMITIES: No pedal edema, cyanosis, or clubbing.  NEUROLOGIC: Patient is lethargic. Gait not checked.  PSYCHIATRIC: The patient is disoriented SKIN: No obvious rash, lesion, or ulcer.    LABORATORY PANEL:   CBC  Recent Labs Lab 02/01/16 0450  WBC 11.5*  HGB 10.3*  HCT 31.2*  PLT 183   ------------------------------------------------------------------------------------------------------------------  Chemistries   Recent Labs Lab 02/03/2016 0948 02/01/16 0450  NA 138  --   K 4.7  --   CL 105  --   CO2 24  --   GLUCOSE 91  --   BUN 25*  --   CREATININE 1.55* 1.24*  CALCIUM 9.6  --    ------------------------------------------------------------------------------------------------------------------  Cardiac Enzymes  Recent Labs Lab 01/15/2016 0948  TROPONINI <0.03   ------------------------------------------------------------------------------------------------------------------  RADIOLOGY:  Dg Chest 2 View  Result Date: 01/31/2016 CLINICAL DATA:  82 year old female with fever and lethargy. EXAM: CHEST  2 VIEW COMPARISON:  Chest radiograph dated 01/23/2016 FINDINGS: Two views of the chest demonstrate an area of opacity in the right lower lobe and right hilar region, new or progressed since the prior radiograph and most compatible with pneumonia. Underlying mass is not excluded. Clinical correlation and follow-up resolution is recommended. The left lung is clear. There is mild cardiomegaly. There is atherosclerotic calcification of the thoracic aorta. No acute osseous pathology identified. IMPRESSION: Right hilar opacity concerning for pneumonia. Correlation with clinical exam for exam follow-up to resolution is recommended. Electronically Signed   By: Elgie CollardArash  Radparvar M.D.   On: 01/31/2016 21:26   Mr Brain Wo Contrast  Result Date: 01/31/2016 CLINICAL DATA:  Acute onset of slurred speech and  difficulty swallowing. Dementia up. EXAM: MRI HEAD WITHOUT CONTRAST MRA  HEAD WITHOUT CONTRAST TECHNIQUE: Multiplanar, multiecho pulse sequences of the brain and surrounding structures were obtained without intravenous contrast. Angiographic images of the head were obtained using MRA technique without contrast. COMPARISON:  CT 02-01-16.  MRI 05/29/2015. FINDINGS: MRI HEAD FINDINGS Brain: 1 x 1.5 cm acute infarction in the left basal ganglia. No other acute infarction. Chronic small-vessel ischemic changes are present throughout the pons. There are old small vessel infarctions. Old infarctions are present within the basal ganglia bilaterally. Extensive chronic small-vessel ischemic changes affect the cerebral hemispheric white matter bilaterally. No mass lesion, hemorrhage, hydrocephalus or extra-axial collection. Few foci of hemosiderin deposition related old infarctions. Vascular: Major vessels at the base of the brain show flow. Skull and upper cervical spine: Negative Sinuses/Orbits: Clear/normal Other: None significant MRA HEAD FINDINGS Both internal carotid arteries are widely patent into the brain. No siphon stenosis. The anterior and middle cerebral vessels are patent without proximal stenosis, aneurysm or vascular malformation. More distal branch vessels show atherosclerotic irregularity. Both vertebral arteries are patent with the left being dominant. No basilar stenosis. Superior cerebellar and posterior cerebral arteries are patent. More distal PCA branches show atherosclerotic irregularity. IMPRESSION: 1 x 1.5 cm acute infarction in the left basal ganglia. Extensive old ischemic changes affecting the pons, cerebellum, basal ganglia and cerebral hemispheric white matter. Intracranial MR angiography does not show any large vessel occlusion or correctable proximal stenosis. Distal vessel atherosclerotic narrowing and irregularity diffusely. Electronically Signed   By: Paulina Fusi M.D.   On: 01/31/2016 13:24   Mr Maxine Glenn Head/brain ON Cm  Result Date: 01/31/2016 CLINICAL  DATA:  Acute onset of slurred speech and difficulty swallowing. Dementia up. EXAM: MRI HEAD WITHOUT CONTRAST MRA HEAD WITHOUT CONTRAST TECHNIQUE: Multiplanar, multiecho pulse sequences of the brain and surrounding structures were obtained without intravenous contrast. Angiographic images of the head were obtained using MRA technique without contrast. COMPARISON:  CT 01-Feb-2016.  MRI 05/29/2015. FINDINGS: MRI HEAD FINDINGS Brain: 1 x 1.5 cm acute infarction in the left basal ganglia. No other acute infarction. Chronic small-vessel ischemic changes are present throughout the pons. There are old small vessel infarctions. Old infarctions are present within the basal ganglia bilaterally. Extensive chronic small-vessel ischemic changes affect the cerebral hemispheric white matter bilaterally. No mass lesion, hemorrhage, hydrocephalus or extra-axial collection. Few foci of hemosiderin deposition related old infarctions. Vascular: Major vessels at the base of the brain show flow. Skull and upper cervical spine: Negative Sinuses/Orbits: Clear/normal Other: None significant MRA HEAD FINDINGS Both internal carotid arteries are widely patent into the brain. No siphon stenosis. The anterior and middle cerebral vessels are patent without proximal stenosis, aneurysm or vascular malformation. More distal branch vessels show atherosclerotic irregularity. Both vertebral arteries are patent with the left being dominant. No basilar stenosis. Superior cerebellar and posterior cerebral arteries are patent. More distal PCA branches show atherosclerotic irregularity. IMPRESSION: 1 x 1.5 cm acute infarction in the left basal ganglia. Extensive old ischemic changes affecting the pons, cerebellum, basal ganglia and cerebral hemispheric white matter. Intracranial MR angiography does not show any large vessel occlusion or correctable proximal stenosis. Distal vessel atherosclerotic narrowing and irregularity diffusely. Electronically Signed    By: Paulina Fusi M.D.   On: 01/31/2016 13:24    EKG:   Orders placed or performed during the hospital encounter of 02-01-2016  . ED EKG  . ED EKG    ASSESSMENT AND PLAN:   #Sepsis On broad-spectrum IV antibiotics  cefepime and vancomycin Blood cultures no growth less than 12 hours Urine culture is pending Strict nothing by mouth IV fluids   #Hematochezia PPI, monitor hemoglobin closely and transfuse as needed Son wants to hold off on the GI consult until tomorrow Discontinued aspirin and heparin subcutaneous  # Acute CVA with dysarthria and dysphagia and chronic dementia  telemetry monitor  neuro check MRI/MRA of brain with acute 1 x 1.5 cm infarct in the left basal ganglia. ICA with no significant stenosis  echocardiogram 60-65% ejection fraction  carotid duplex with no significant stenosis Keep nothing by mouth with IV fluid support,  speech study, PT evaluation and OT evaluation is pending Discontinued aspirin as patient has hematochezia Continue statin if passed speech study. LDL 109  #Acute renal failure. normal saline IV and follow-up BMP.  #Hypertension. Patient is nothing by mouth Allow permissive hypertension for the first 24 hours after that we will provide hydralazine IV every 6 hours as needed while patient is nothing by mouth  #Chronic diarrhea. Continue  when necessary and probiotics.    Goals of care-discussed with patient's son healthcare power of attorney He understands patient's current Situation and poor prognosis agreeable with DO NOT RESUSCITATE and consider hospice versus comfort care if no clinical improvement in the next 24 hours.  All the records are reviewed and case discussed with Care Management/Social Workerr. Management plans discussed with the patient's son [name] and they are in agreement.  CODE STATUS: dnr ,Son is the healthcare power of attorney  TOTAL TIME TAKING CARE OF THIS PATIENT: 35 minutes.   POSSIBLE D/C IN 2   DAYS, DEPENDING ON CLINICAL CONDITION.  Note: This dictation was prepared with Dragon dictation along with smaller phrase technology. Any transcriptional errors that result from this process are unintentional.   Ramonita LabGouru, Alzena Gerber M.D on 02/01/2016 at 5:09 PM  Between 7am to 6pm - Pager - [phone] After 6pm go to [email or web address] - password [email or web address] [facility]  [facility]  Office  [phone]  CC: Primary care physician; BABAOFF, Lavada MesiMARC E, MD

## 2016-02-01 NOTE — Progress Notes (Signed)
Pharmacy Antibiotic Note  Martha DialsRuth Hinckley is a 82 y.o. female admitted on September 29, 2015 with acute CVA ordered abx for possible PNA.  Pharmacy has been consulted for vancomycin and cefepime dosing.  Plan: Vancomycin 750 mg iv q 36 hours with stacked dosing. Will tentatively plan to check a random level before administering 3rd dose.   Cefepime 2 g iv q 24 hours.   Height: 4\' 11"  (149.9 cm) Weight: 122 lb 2 oz (55.4 kg) IBW/kg (Calculated) : 43.2  Temp (24hrs), Avg:98.8 F (37.1 C), Min:98 F (36.7 C), Max:99.2 F (37.3 C)   Recent Labs Lab 2015/08/07 0948 01/31/16 1805 02/01/16 0450  WBC 9.5 12.6* 11.5*  CREATININE 1.55*  --  1.24*    Estimated Creatinine Clearance: 25.2 mL/min (by C-G formula based on SCr of 1.24 mg/dL (H)).    Allergies  Allergen Reactions  . Niacin And Related   . Nitrates, Organic Other (See Comments)    Reaction: unknown  . Penicillins Other (See Comments)    Has patient had a PCN reaction causing immediate rash, facial/tongue/throat swelling, SOB or lightheadedness with hypotension: unknown  Has patient had a PCN reaction causing severe rash involving mucus membranes or skin necrosis: unknown Has patient had a PCN reaction that required hospitalization: unknown Has patient had a PCN reaction occurring within the last 10 years: unknown If all of the above answers are "NO", then may proceed with Cephalosporin use.  Reactions not listed on MAR  . Sulfa Antibiotics Other (See Comments)    Reaction: unknown     Antimicrobials this admission: cefepime 12/17 >>  vancomcyin 12/17 >>   Dose adjustments this admission:   Microbiology results: BCx: NGTD 12/16 UCx: sent   12/16 MRSA PCR: positive  Thank you for allowing pharmacy to be a part of this patient's care.  Delsa BernKelly m Fuhrmann, PharmD 02/01/2016 10:14 AM

## 2016-02-01 NOTE — Progress Notes (Signed)
Dr Amado CoeGouru made aware of fever depsite tylenol supp, new order to given additional tylenol supp now, also made aware that pt o2 sat now only 89-90% on 3L [place], Dr Amado CoeGouru to assess and speak with son who is at the bedside

## 2016-02-01 NOTE — Evaluation (Signed)
Physical Therapy Evaluation Patient Details Name: Martha DialsRuth Grassia MRN: [id number] DOB: 10-Dec-1928 Today's Date: 02/01/2016   History of Present Illness  Pt. is an 82 y.o. female who [facility] and Facial droop. Pt. PMHx inclueds: Hyperlipidemia, TIA, UTI, and Dementia.  Clinical Impression  Pt is a pleasant 82 year old female who was admitted for L basal ganglia CVA. Pt very lethargic at time of evaluation, however able to keep eyes open and participate minimally. Pt performs bed mobility with max assist and able to sit at EOB for a few minutes prior to fatigue, unsafe to perform further mobility at this time. Pt is not at baseline level. Pt demonstrates deficits with strength/cognition/endurance/mobility. Unable to participate in sensation/coordination testing at this time. Would benefit from skilled PT to address above deficits and promote optimal return to PLOF; recommend transition to STR upon discharge from acute hospitalization.       Follow Up Recommendations SNF    Equipment Recommendations  Rolling walker with 5" wheels    Recommendations for Other Services       Precautions / Restrictions Precautions Precautions: Fall Restrictions Weight Bearing Restrictions: No      Mobility  Bed Mobility Overal bed mobility: Needs Assistance Bed Mobility: Supine to Sit     Supine to sit: Max assist     General bed mobility comments: assist for seated at EOB, pt only able to tolerate sitting for a few minutes prior to fatigue. multiple LOB in post direction during seated, able to self correct with mod assist. heavy assist required for return to supine.  Transfers                 General transfer comment: unsafe at this time  Ambulation/Gait                Stairs            Wheelchair Mobility    Modified Rankin (Stroke Patients Only)       Balance Overall balance assessment: History of Falls;Needs  assistance Sitting-balance support: Feet supported Sitting balance-Leahy Scale: Poor                                       Pertinent Vitals/Pain Pain Assessment: No/denies pain Pain Score: 0-No pain    Home Living Family/patient expects to be discharged to:: Assisted living               Home Equipment: Walker - 4 wheels      Prior Function Level of Independence: Needs assistance      ADL's / Homemaking Assistance Needed: Pt. son reports pt. required assist for morning ADLs, and walked to the dining area for meals.  Comments: Family reports pt needed Min-MaxA for dressing and bathing, and she has a rollator, but walks minimally at the facility.      Hand Dominance        Extremity/Trunk Assessment   Upper Extremity Assessment Upper Extremity Assessment: Difficult to assess due to impaired cognition    Lower Extremity Assessment Lower Extremity Assessment: Generalized weakness (B LE grossly 2/5; unable to fully assess )       Communication   Communication: Expressive difficulties  Cognition Arousal/Alertness: Lethargic Behavior During Therapy: Flat affect Overall Cognitive Status: Difficult to assess  General Comments: Pt. very lethargic throughout session.    General Comments      Exercises Other Exercises Other Exercises: Supine ther-ex performed including B LE ankle pumps, SLRs, SAQ, and hip abd/add. Pt also performed 5 anterior weight shifts while sitting at EOB. all ther-ex performed x 10 reps with cues for correct technique. Cues given for assistance as well as mod assist for performance. difficult to keep awake during ther-ex   Assessment/Plan    PT Assessment Patient needs continued PT services  PT Problem List Decreased strength;Decreased activity tolerance;Decreased balance;Decreased mobility;Decreased cognition          PT Treatment Interventions DME instruction;Gait training;Therapeutic exercise     PT Goals (Current goals can be found in the Care Plan section)  Acute Rehab PT Goals Patient Stated Goal: To feel better PT Goal Formulation: With patient/family Time For Goal Achievement: 02/15/16 Potential to Achieve Goals: Good    Frequency 7X/week   Barriers to discharge        Co-evaluation               End of Session Equipment Utilized During Treatment: Gait belt;Oxygen Activity Tolerance: Patient limited by lethargy Patient left: in bed;with bed alarm set;with nursing/sitter in room;with family/visitor present Nurse Communication: Mobility status         Time: [id number] PT Time Calculation (min) (ACUTE ONLY): 27 min   Charges:   PT Evaluation $PT Eval High Complexity: 1 Procedure PT Treatments $Therapeutic Exercise: 8-22 mins   PT G Codes:        [place] Se[place] 02/01/2016, 10:57 AM  Elizabeth PalauStephanie Kitty Cadavid, PT, DPT [phone]

## 2016-02-01 NOTE — Plan of Care (Signed)
Problem: SLP Dysphagia Goals Goal: Misc Dysphagia Goal Pt will safely tolerate po diet of least restrictive consistency w/ no overt s/s of aspiration noted by Staff/pt/family x3 sessions.    

## 2016-02-01 NOTE — Clinical Social Work Note (Signed)
Clinical Social Work Assessment  Patient Details  Name: Martha Hunter MRN: [id number] Date of Birth: 10-15-1928  Date of referral:  02/01/16               Reason for consult:  Other (Comment Required) (From [facility] )                Permission sought to share information with:  Chartered certified accountant granted to share information::  Yes, Verbal Permission Granted  Name::      [name]   Agency::     Relationship::     Contact Information:     Housing/Transportation Living arrangements for the past 2 months:  [place] of Information:  Adult Children, Power of Attorney Patient Interpreter Needed:  None Criminal Activity/Legal Involvement Pertinent to Current Situation/Hospitalization:  No - Comment as needed Significant Relationships:  Adult Children Lives with:  Facility Resident Do you feel safe going back to the place where you live?  Yes Need for family participation in patient care:  Yes (Comment)  Care giving concerns:  Patient is a long term care resident at [facility].    Social Worker assessment / plan:  Holiday representative (CSW) received SNF consult. PT is recommending SNF. Palliative is following patient. CSW met with patient's son/ HPOA Ulice Dash [phone] in the family meeting room on 1C. Per son he is patient's HPOA and his sister Juliann Pulse lives out of town and is on her way to [facility]. Per son patient lives at [place] and he reports that she does not have dementia. Per son patient has declined in the last couple of days and now can't swallow. Patient is NPO. Per son patient is also on oxygen which is new. Son reported that MD and palliative NP are going to watch patient over the next 24 hours to see if she shows any improvement. Son reported that he is interested in hospice services and prefers the hospice facility in [place] on [address]. Per son he does not know if [name] will take patient back  in this condition being bed bound and total care. CSW provided emotional support. CSW will continue to follow and assist as needed.    Employment status:  Retired Forensic scientist:  Medicare PT Recommendations:    Information / Referral to community resources:  [place]  Patient/Family's Response to care:  Patient's son prefers [facility] if patient meets criteria for hospice facility.   Patient/Family's Understanding of and Emotional Response to Diagnosis, Current Treatment, and Prognosis:  Patient's son was very pleasant and thanked CSW for assistance.   Emotional Assessment Appearance:  Appears stated age Attitude/Demeanor/Rapport:  Unable to Assess Affect (typically observed):  Unable to Assess Orientation:  Oriented to Self, Fluctuating Orientation (Suspected and/or reported Sundowners) Alcohol / Substance use:  Not Applicable Psych involvement (Current and /or in the community):  No (Comment)  Discharge Needs  Concerns to be addressed:  Discharge Planning Concerns Readmission within the last 30 days:  No Current discharge risk:  Dependent with Mobility, Cognitively Impaired, Chronically ill Barriers to Discharge:  Continued Medical Work up   [employer], Veronia Beets, LCSW 02/01/2016, 4:00 PM

## 2016-02-01 NOTE — Progress Notes (Signed)
Initial Nutrition Assessment  DOCUMENTATION CODES:   Non-severe (moderate) malnutrition in context of chronic illness  INTERVENTION:  No nutrition intervention appropriate at this time.  Will continue to monitor patient's medical course and discussions regarding goals of care to recommend appropriate nutrition intervention.  NUTRITION DIAGNOSIS:   Malnutrition (Moderate) related to chronic illness as evidenced by 14 percent weight loss over 6 months, moderate depletions of muscle mass, moderate depletion of body fat.  GOAL:   Patient will meet greater than or equal to 90% of their needs  MONITOR:   PO intake, Labs, Weight trends, I & O's  REASON FOR ASSESSMENT:   Malnutrition Screening Tool    ASSESSMENT:   82 y.o. female with a known history of Hypertension, hyperlipidemia, TIA, UTI and dementia. The patient woke up the morning of admission and was unable to take her pills. Patient found to have Acute CVA with dysarthria and dysphagia, acute renal failure.  -Palliative Medicine consult in to discuss goals of care with patient/family. -Per SLP Evaluation today patient with severe oropharyngeal phase dysphagia and is high risk for aspiration at this time. Recommending NPO with aspiration precautions at this time - medication admin via alternative means.  Spoke with patient's son at beside. Patient began having decreased intake of meals back in June 2017. She was hospitalized for AMS/weakness and had difficulty swallowing following that. Due to the modified texture diet, patient was not eating well and lost weight. Son reports she was eventually advanced back to regular texture diet with thin liquids, but that she still does not eat very well (attempts 3 meals/day - does not finish). Endorses nausea, abdominal pain, and diarrhea this past week as patient was not feeling well.   Patient was 142 lbs on 6/2/217. She has lost 14% body weight over 6 months, which is significant for time  frame. A majority of the weight lost occurred from June to July.   Medications reviewed and include: pantoprazole, vancomycin, NS @ 50 ml/hr.  Labs reviewed: Creatinine 1.24.  Nutrition-Focused physical exam completed. Findings are mild-moderate fat depletion, moderate muscle depletion, and no edema.   Patient meets criteria for moderate chronic malnutrition in setting of mild-moderate fat depletion, moderate muscle depletion, 14% weight loss over 6 months.  Discussed with RN.   Diet Order:  Diet NPO time specified  Skin:  Reviewed, no issues  Last BM:  02/01/2016  Height:   Ht Readings from Last 1 Encounters:  01/27/2016 4\' 11"  (1.499 m)    Weight:   Wt Readings from Last 1 Encounters:  01/24/2016 122 lb 2 oz (55.4 kg)    Ideal Body Weight:  43.2 kg  BMI:  Body mass index is 24.67 kg/m.  Estimated Nutritional Needs:   Kcal:  1000-1200 (MSJ x 1.1-1.3)  Protein:  55-66 grams (1-1.2 grams/kg)  Fluid:  >/= 1.3 L/day (25 ml/kg)  EDUCATION NEEDS:   Education needs no appropriate at this time  Helane RimaLeanne Draken Farrior, MS, RD, LDN Pager: [phone] After Hours Pager: [phone]

## 2016-02-01 NOTE — Evaluation (Signed)
Clinical/Bedside Swallow Evaluation Patient Details  Name: Martha Hunter MRN: [id number] Date of Birth: [birth year]  Today's Date: 02/01/2016 Time: SLP Start Time (ACUTE ONLY): 0815 SLP Stop Time (ACUTE ONLY): 0910 SLP Time Calculation (min) (ACUTE ONLY): 55 min  Past Medical History:  Past Medical History:  Diagnosis Date  . Bladder instability   . Bladder spasm   . CAP (community acquired pneumonia)    a. 07/2014.  Marland Kitchen. Cystocele   . Depression   . History of TIA (transient ischemic attack)    a. on plavix chronically.  . Hypercholesteremia   . Hyperlipidemia   . Hypertension   . Rectocele   . Staph infection   . UTI (lower urinary tract infection)   . Vaginal atrophy    Past Surgical History:  Past Surgical History:  Procedure Laterality Date  . DILATION AND CURETTAGE OF UTERUS    . EYE SURGERY    . None     HPI:  Pt is a 82 y.o. female with a known history of Hypertension, hyperlipidemia, TIA, UTI and Dementia. The patient woke up this morning and was unable to take her pills. She was found with slurred speech and trouble swallowing. The patient is demented and not a good historian. She was noticed right-sided facial droop and slurred speech in the ED, suspected acute CVA. But head CT is negative and she is not good candidate for TPA treatment per ED physician. Pt's Cognitive status is declined currently w/ minimal verbalizations and interaction; mumbled speech and drowsy.   Assessment / Plan / Recommendation Clinical Impression  Pt appears to present w/ severe oropharyngeal phase dysphagia w/ high risk for aspiration at this time. Pt exhibited poor awareness of bolus when presented orally, reduced lingual movements and labial seal, oral residue of Nectar liquid material w/ R anterior leakage of bolus material, and NO pharyngeal swallow was appreciated - pt was given verbal/tactile cues in attempts to stimulate oral awareness and pharyngeal swallowing. After approximately 2-3  mintures, pt appeared to exhibit some laryngeal excursion movement; also noted min wet vocal quality during the mumbled, dysarthric speech. Due to pt's high risk for aspiration at this time, recommend pt remain NPO including oral medications. Recommend frequent oral care via swabs to enhance oral stimulation and provide hygiene while NPO. NSG and MD updated on above results and recommendations.  Pt was total assist w/ feeding of po's; often clenched teeth during oral care - suspect impact of Cognitive status/Dementia on being able to follow through w/ tasks.     Aspiration Risk  Severe aspiration risk    Diet Recommendation  NPO; frequent oral care for hygiene and oral stimulation while NPO; aspiration precautions  Medication Administration: Via alternative means    Other  Recommendations Recommended Consults:  (Palliative Care consult; Dietician consult) Oral Care Recommendations: Oral care QID;Staff/trained caregiver to provide oral care Other Recommendations: Have oral suction available   Follow up Recommendations  (TBD)      Frequency and Duration min 3x week  2 weeks       Prognosis Prognosis for Safe Diet Advancement: Guarded Barriers to Reach Goals: Cognitive deficits;Severity of deficits      Swallow Study   General Date of Onset: 2015/05/20 HPI: Pt is a 82 y.o. female with a known history of Hypertension, hyperlipidemia, TIA, UTI and Dementia. The patient woke up this morning and was unable to take her pills. She was found with slurred speech and trouble swallowing. The patient is demented and not a  good historian. She was noticed right-sided facial droop and slurred speech in the ED, suspected acute CVA. But head CT is negative and she is not good candidate for TPA treatment per ED physician. Pt's Cognitive status is declined currently w/ minimal verbalizations and interaction; mumbled speech and drowsy. Type of Study: Bedside Swallow Evaluation Previous Swallow Assessment: none  indicated Diet Prior to this Study:  (unknown) Temperature Spikes Noted: No (wbc 11.5) Respiratory Status: Nasal cannula (2 liters) History of Recent Intubation: No Behavior/Cognition: Confused;Lethargic/Drowsy;Requires cueing;Doesn't follow directions (not consistent) Oral Cavity Assessment: Dry;Dried secretions (appeared to have been drooling on R side) Oral Care Completed by SLP: Yes Oral Cavity - Dentition: Adequate natural dentition;Missing dentition (few ) Vision:  (n/a) Self-Feeding Abilities: Total assist Patient Positioning: Upright in bed Baseline Vocal Quality: Low vocal intensity (mumbled and appeared dysarthric) Volitional Cough: Cognitively unable to elicit Volitional Swallow: Unable to elicit    Oral/Motor/Sensory Function Overall Oral Motor/Sensory Function: Moderate impairment (weak overall; did not follow through w/ movements) Facial ROM: Reduced right (min) Facial Symmetry: Abnormal symmetry right (min ) Facial Strength: Reduced right (min) Lingual ROM:  (could not determine) Lingual Symmetry:  (could not determine) Lingual Strength:  (could not determine) Velum:  (could not determine) Mandible:  (could not determine)   Ice Chips Ice chips: Impaired Presentation: Spoon (1 trial) Oral Phase Impairments: Poor awareness of bolus;Reduced lingual movement/coordination Oral Phase Functional Implications: Right anterior spillage Pharyngeal Phase Impairments: Suspected delayed Swallow;Unable to trigger swallow   Thin Liquid Thin Liquid: Not tested    Nectar Thick Nectar Thick Liquid: Impaired Presentation: Spoon (2 trials) Oral Phase Impairments: Reduced labial seal;Reduced lingual movement/coordination;Poor awareness of bolus Oral phase functional implications: Right anterior spillage;Prolonged oral transit;Oral residue Pharyngeal Phase Impairments: Suspected delayed Swallow;Unable to trigger swallow;Wet Vocal Quality (after 1-2 minutes when mumbling)   Honey Thick  Honey Thick Liquid: Not tested   Puree Puree: Not tested   Solid   GO   Solid: Not tested         Jerilynn SomKatherine Rosendo Couser, MS, CCC-SLP Jarrick Fjeld 02/01/2016,9:15 AM

## 2016-02-01 NOTE — Consult Note (Signed)
Consultation Note Date: 02/01/2016   Patient Name: Martha Hunter  DOB: 1928/04/29  MRN: [id number]  Age / Sex: 82 y.o., female  PCP: Derinda Late, MD Referring Physician: Nicholes Mango, MD  Reason for Consultation: Establishing goals of care  HPI/Patient Profile: 82 yo female with PMH significant for HTN, HLD, TIA, UTI admitted 01/22/2016 with weakness, slurred speech, trouble swallowing, slurred speech and right-sided facial droop. MRI results show acute left basal ganglia infarct.    Clinical Assessment and Goals of Care: I met today with Ms. Gallen son, Jenny Reichmann. Ms. Deguzman is not able to participate in conversation - very lethargic. When I sit down with Jenny Reichmann he first asks me "is mother going to die?" I was very upfront with him and explained that I fear that that she is going to die.   We spoke more about her stroke and her wishes for aggressive care. Jenny Reichmann says that they did not speak a lot but she was very clear about her wishes and she would not want to be prolonged in any way in this state. At this time he does not wish for artificial feeding unless she were to greatly improve. We discussed aspiration risk and progression at EOL. Jenny Reichmann was a Veterinary surgeon and is very familiar with EOL and hospice. He says that he would want her in hospice if possible and if this is the end of her life.   At this time he does not want to escalate care and is strongly considering comfort care but would like to speak with his mother's other providers and his family. He is worried about his sister and how she will handle their mother's poor prognosis. Will follow up tomorrow.   Primary Decision Maker HCPOA son Jenny Reichmann    SUMMARY OF RECOMMENDATIONS   - Likely to be full comfort care - No artifical feeding or escalation of care  Code Status/Advance Care Planning:  DNR   Symptom Management:   Recommend morphine prn  pain/dyspnea.   Palliative Prophylaxis:   Aspiration, Frequent Pain Assessment, Oral Care and Turn Reposition  Additional Recommendations (Limitations, Scope, Preferences):  Likely to transition to full comfort care  Psycho-social/Spiritual:   Desire for further Chaplaincy support:yes  Additional Recommendations: Caregiving  Support/Resources and Education on Hospice  Prognosis:   Hours - Days  Discharge Planning: To Be Determined      Primary Diagnoses: Present on Admission: . Acute CVA (cerebrovascular accident) ([place])   I have reviewed the medical record, interviewed the patient and family, and examined the patient. The following aspects are pertinent.  Past Medical History:  Diagnosis Date  . Bladder instability   . Bladder spasm   . CAP (community acquired pneumonia)    a. 07/2014.  Marland Kitchen Cystocele   . Depression   . History of TIA (transient ischemic attack)    a. on plavix chronically.  . Hypercholesteremia   . Hyperlipidemia   . Hypertension   . Rectocele   . Staph infection   . UTI (lower urinary tract infection)   .  Vaginal atrophy    Social History   Social History  . Marital status: Widowed    Spouse name: N/A  . Number of children: N/A  . Years of education: N/A   Social History Main Topics  . Smoking status: Former Research scientist (life sciences)  . Smokeless tobacco: Never Used  . Alcohol use No  . Drug use: No  . Sexual activity: Not Currently   Other Topics Concern  . None   Social History Narrative   Lives alone. Ambulates with a walker due to rare episodes of falls and dizziness   Family History  Problem Relation Age of Onset  . Congestive Heart Failure Mother   . CAD Father   . CAD      parents and 9 siblings all died with CAD.  Marland Kitchen Heart disease Sister   . Heart disease Brother   . Cancer Neg Hx   . Diabetes Neg Hx    Scheduled Meds: . ceFEPime (MAXIPIME) IV  2 g Intravenous q1800  . mupirocin ointment  1 application Nasal BID  . pantoprazole  (PROTONIX) IV  40 mg Intravenous Q12H  . vancomycin  750 mg Intravenous Q36H   Continuous Infusions: . sodium chloride 50 mL/hr at 02/01/16 0738   PRN Meds:.acetaminophen **OR** acetaminophen (TYLENOL) oral liquid 160 mg/5 mL **OR** acetaminophen, hydrALAZINE Allergies  Allergen Reactions  . Niacin And Related   . Nitrates, Organic Other (See Comments)    Reaction: unknown  . Penicillins Other (See Comments)    Has patient had a PCN reaction causing immediate rash, facial/tongue/throat swelling, SOB or lightheadedness with hypotension: unknown  Has patient had a PCN reaction causing severe rash involving mucus membranes or skin necrosis: unknown Has patient had a PCN reaction that required hospitalization: unknown Has patient had a PCN reaction occurring within the last 10 years: unknown If all of the above answers are "NO", then may proceed with Cephalosporin use.  Reactions not listed on MAR  . Sulfa Antibiotics Other (See Comments)    Reaction: unknown    Review of Systems  Unable to perform ROS: Acuity of condition    Physical Exam  Constitutional: She appears well-developed. She appears lethargic.  HENT:  Head: Normocephalic and atraumatic.  Cardiovascular: Normal rate and regular rhythm.   Pulmonary/Chest: Effort normal. Tachypnea noted.  Abdominal: Soft. Normal appearance.  Neurological: She appears lethargic. She is disoriented.  Nursing note and vitals reviewed.   Vital Signs: BP (!) 135/57 (BP Location: Left Arm)   Pulse 76   Temp 98.7 F (37.1 C) (Axillary)   Resp (!) 22   Ht 4' 11"  (1.499 m)   Wt 55.4 kg (122 lb 2 oz)   SpO2 94%   BMI 24.67 kg/m  Pain Assessment: No/denies pain       SpO2: SpO2: 94 % O2 Device:SpO2: 94 % O2 Flow Rate: .O2 Flow Rate (L/min): 2 L/min  IO: Intake/output summary:  Intake/Output Summary (Last 24 hours) at 02/01/16 1211 Last data filed at 02/01/16 0300  Gross per 24 hour  Intake          1153.33 ml  Output                 0 ml  Net          1153.33 ml    LBM: Last BM Date: 01/31/16 Baseline Weight: Weight: 55.4 kg (122 lb 2 oz) Most recent weight: Weight: 55.4 kg (122 lb 2 oz)     Palliative Assessment/Data:  Time In: 1100 Time Out: 1210 Time Total: 42mn Greater than 50%  of this time was spent counseling and coordinating care related to the above assessment and plan.  Signed by: AVinie Sill NP Palliative Medicine Team Pager # [phone](M-F 8a-5p) Team Phone # [phone](Nights/Weekends)

## 2016-02-01 NOTE — Progress Notes (Addendum)
Family Meeting Note  Advance Directive:yes  Today a meeting took place with the Patient'S [facility] healthcare power of attorney  Patient is unable to participate due NW:GNFAOZto:Lacked capacity altered with delirium   The following clinical team members were present during this meeting:MD  The following were discussed:Patient's diagnosis: , Patient's progosis: Unable to determine and Goals for treatment: DNR, not considering BiPAP, agreeable with morphine as needed, son is considering palliative care with hospice versus comfort care if patient is not clinically improving. He understood the critical situation and poor prognosis status  Additional follow-up to be provided: Hospitalist and palliative care  Time spent during discussion:20 minutes  Onda Kattner, Deanna ArtisAruna, MD

## 2016-02-02 DIAGNOSIS — Z515 Encounter for palliative care: Secondary | ICD-10-CM

## 2016-02-02 LAB — BASIC METABOLIC PANEL
Anion gap: 7 (ref 5–15)
BUN: 27 mg/dL — ABNORMAL HIGH (ref 6–20)
CALCIUM: 8.9 mg/dL (ref 8.9–10.3)
CO2: 23 mmol/L (ref 22–32)
Chloride: 111 mmol/L (ref 101–111)
Creatinine, Ser: 1.09 mg/dL — ABNORMAL HIGH (ref 0.44–1.00)
GFR, EST AFRICAN AMERICAN: 52 mL/min — AB (ref >60)
GFR, EST NON AFRICAN AMERICAN: 45 mL/min — AB (ref >60)
Glucose, Bld: 91 mg/dL (ref 65–99)
POTASSIUM: 3.6 mmol/L (ref 3.5–5.1)
Sodium: 141 mmol/L (ref 135–145)

## 2016-02-02 LAB — CBC
HCT: 31.9 % — ABNORMAL LOW (ref 35.0–47.0)
Hemoglobin: 10.6 g/dL — ABNORMAL LOW (ref 12.0–16.0)
MCH: 27.8 pg (ref 26.0–34.0)
MCHC: 33.3 g/dL (ref 32.0–36.0)
MCV: 83.3 fL (ref 80.0–100.0)
PLATELETS: 201 10*3/uL (ref 150–440)
RBC: 3.82 MIL/uL (ref 3.80–5.20)
RDW: 15.5 % — AB (ref 11.5–14.5)
WBC: 8.7 10*3/uL (ref 3.6–11.0)

## 2016-02-02 MED ORDER — CEFEPIME-DEXTROSE 2 GM/50ML IV SOLR
2.0000 g | Freq: Every day | INTRAVENOUS | Status: DC
  Filled 2016-02-02: qty 50

## 2016-02-02 MED ORDER — MORPHINE SULFATE (PF) 4 MG/ML IV SOLN
2.0000 mg | INTRAVENOUS | Status: DC | PRN
  Administered 2016-02-02 – 2016-02-04 (×4): 2 mg via INTRAVENOUS
  Filled 2016-02-02 (×5): qty 1

## 2016-02-02 MED ORDER — POLYVINYL ALCOHOL 1.4 % OP SOLN
1.0000 [drp] | Freq: Four times a day (QID) | OPHTHALMIC | Status: DC | PRN
  Filled 2016-02-02: qty 15

## 2016-02-02 MED ORDER — GLYCOPYRROLATE 0.2 MG/ML IJ SOLN: 0.2000 mg | INTRAMUSCULAR | Status: DC | PRN

## 2016-02-02 MED ORDER — MORPHINE SULFATE (PF) 4 MG/ML IV SOLN
2.0000 mg | INTRAVENOUS | Status: DC
  Administered 2016-02-02 – 2016-02-04 (×11): 2 mg via INTRAVENOUS
  Filled 2016-02-02 (×10): qty 1

## 2016-02-02 MED ORDER — BIOTENE DRY MOUTH MT LIQD: 15.0000 mL | OROMUCOSAL | Status: DC | PRN

## 2016-02-02 MED ORDER — HALOPERIDOL LACTATE 5 MG/ML IJ SOLN
1.0000 mg | Freq: Four times a day (QID) | INTRAMUSCULAR | Status: DC | PRN
  Administered 2016-02-02: 20:00:00 1 mg via INTRAVENOUS
  Filled 2016-02-02: qty 1

## 2016-02-02 NOTE — Progress Notes (Signed)
Per Palliative NP patient is not stable for transfer to hospice facility at this time. [facility]'s [facility] if [facility] is possible. [facility] is aware of above. Clinical Social Worker (CSW) will continue to follow and assist as needed.   Baker Hughes IncorporatedBailey Charrise Lardner, LCSW [phone]

## 2016-02-02 NOTE — Progress Notes (Signed)
Family Meeting Note  Advance Directive:yes  Today a meeting took place with the Daughter, son-in-law, granddaughters.  Patient is unable to participate due ZO:XWRUEAto:Lacked capacity Comatose   The following clinical team members were present during this meeting:MD  The following were discussed:Patient's diagnosis: , Patient's progosis: < 6 months and Goals for treatment: DNR, very poor prognosis clinically deteriorating. Family requested comfort care measures. Hospice facility transfer in a.m. if patient is stable enough  Additional follow-up to be provided: Hospitalist, palliative care and hospice care nurse  Time spent during discussion:20 minutes  Princetta Uplinger, Deanna ArtisAruna, MD

## 2016-02-02 NOTE — Progress Notes (Signed)
PT Cancellation Note  Patient Details Name: Martha DialsRuth Hunter MRN: [id number] DOB: Jan 30, 1929   Cancelled Treatment:    Reason Eval/Treat Not Completed: Patient's level of consciousness . Per family, pt is now on comfort care. Will dc PT services at this time.  Sheela Mcculley 02/02/2016, 9:27 AM  Elizabeth PalauStephanie Sherleen Pangborn, PT, DPT [phone]

## 2016-02-02 NOTE — Progress Notes (Signed)
[facility] [place] [facility]   PATIENT NAME: Martha DialsRuth Hunter    MR#:  [id number]  DATE OF BIRTH:  1929-01-06  SUBJECTIVE:  CHIEF COMPLAINT:  Patient is Not responding today. Clinically deteriorating. Family bedside requesting comfort care measures  REVIEW OF SYSTEMS:  Review of systems unobtainable, patient has chronic dementia and now slurry speech  DRUG ALLERGIES:   Allergies  Allergen Reactions  . Niacin And Related   . Nitrates, Organic Other (See Comments)    Reaction: unknown  . Penicillins Other (See Comments)    Has patient had a PCN reaction causing immediate rash, facial/tongue/throat swelling, SOB or lightheadedness with hypotension: unknown  Has patient had a PCN reaction causing severe rash involving mucus membranes or skin necrosis: unknown Has patient had a PCN reaction that required hospitalization: unknown Has patient had a PCN reaction occurring within the last 10 years: unknown If all of the above answers are "NO", then may proceed with Cephalosporin use.  Reactions not listed on MAR  . Sulfa Antibiotics Other (See Comments)    Reaction: unknown     VITALS:  Blood pressure (!) 176/75, pulse 87, temperature 98.7 F (37.1 C), temperature source Oral, resp. rate 18, height 4\' 11"  (1.499 m), weight 55.4 kg (122 lb 2 oz), SpO2 96 %.  PHYSICAL EXAMINATION:  GENERAL:  82 y.o.-year-old patient lying in the bed with no acute distress.  HEENT: Head atraumatic, normocephalic. Oropharynx and nasopharynx clear.  NECK:  Supple, no jugular venous distention. No thyroid enlargement, no tenderness.  LUNGS: Diminished breath sounds bilaterally, no wheezing, rales,rhonchi or crepitation. No use of accessory muscles of respiration.  CARDIOVASCULAR: S1, S2 normal. No murmurs, rubs, or gallops.  NEUROLOGIC: Patient is lethargic. Gait not checked.  PSYCHIATRIC: The patient is disoriented SKIN: No obvious rash, lesion, or ulcer.    LABORATORY  PANEL:   CBC  Recent Labs Lab 02/02/16 0444  WBC 8.7  HGB 10.6*  HCT 31.9*  PLT 201   ------------------------------------------------------------------------------------------------------------------  Chemistries   Recent Labs Lab 02/02/16 0444  NA 141  K 3.6  CL 111  CO2 23  GLUCOSE 91  BUN 27*  CREATININE 1.09*  CALCIUM 8.9   ------------------------------------------------------------------------------------------------------------------  Cardiac Enzymes  Recent Labs Lab 01/27/2016 0948  TROPONINI <0.03   ------------------------------------------------------------------------------------------------------------------  RADIOLOGY:  Dg Chest 2 View  Result Date: 01/31/2016 CLINICAL DATA:  82 year old female with fever and lethargy. EXAM: CHEST  2 VIEW COMPARISON:  Chest radiograph dated 02/13/2016 FINDINGS: Two views of the chest demonstrate an area of opacity in the right lower lobe and right hilar region, new or progressed since the prior radiograph and most compatible with pneumonia. Underlying mass is not excluded. Clinical correlation and follow-up resolution is recommended. The left lung is clear. There is mild cardiomegaly. There is atherosclerotic calcification of the thoracic aorta. No acute osseous pathology identified. IMPRESSION: Right hilar opacity concerning for pneumonia. Correlation with clinical exam for exam follow-up to resolution is recommended. Electronically Signed   By: Elgie CollardArash  Radparvar M.D.   On: 01/31/2016 21:26    EKG:   Orders placed or performed during the hospital encounter of 01/29/2016  . ED EKG  . ED EKG    ASSESSMENT AND PLAN:   #Sepsis On broad-spectrum IV antibiotics cefepime and vancomycin,Which were discontinued as patient is Comfort Care   #Hematochezia  # Acute CVA with dysarthria and dysphagia and chronic dementia   #Acute renal failure. normal saline IV and follow-up BMP.  #Hypertension. Patient is  nothing by  mouth  #Chronic diarrhea. Continue  when necessary and probiotics.    Goals of care-discussed with patient's son healthcare power of attorney  understands patient's current Situation and poor prognosis agreeable with DO NOT RESUSCITATE and requesting comfort care measures  All the records are reviewed and case discussed with Care Management/Social Workerr. Management plans discussed with the patient's son [name] and they are in agreement.  CODE STATUS: dnr ,Son is the healthcare power of attorney  TOTAL TIME TAKING CARE OF THIS PATIENT: 33 minutes.   POSSIBLE D/C IN 2  DAYS, DEPENDING ON CLINICAL CONDITION.  Note: This dictation was prepared with Dragon dictation along with smaller phrase technology. Any transcriptional errors that result from this process are unintentional.   Ramonita LabGouru, Melonie Germani M.D on 02/02/2016 at 4:10 PM  Between 7am to 6pm - Pager - [phone] After 6pm go to [email or web address] - password [email or web address] [facility]  Office  [phone]  CC: Primary care physician; BABAOFF, Lavada MesiMARC E, MD

## 2016-02-02 NOTE — Progress Notes (Signed)
SLP Cancellation Note  Patient Details Name: Martha DialsRuth Hunter MRN: [id number] DOB: 06/13/1928   Cancelled treatment:       Per nursing, pt is not appropriate for trials of PO intake at this time. Per nursing, ST services are no longer indicated. Pt is transitioning to comfort services.   Raymel Cull B. Dreama Saaverton, M.S., CCC-SLP Speech-Language Pathologist  Cortlin Marano 02/02/2016, 10:08 AM

## 2016-02-02 NOTE — Progress Notes (Signed)
Pharmacy Antibiotic Note  Martha Hunter is a 82 y.o. female admitted on 07-07-15 with acute CVA ordered abx for possible PNA.  Pharmacy has been consulted for vancomycin and cefepime dosing.  Patient NPO at this time. Spiked fevers yesterday- 100.2.  Plan: Vancomycin 750 mg iv q 36 hours with stacked dosing. Will tentatively plan to check a random level before administering 3rd dose (12/19 @2000 )  Cefepime 2 g iv q 24 hours.   Height: 4\' 11"  (149.9 cm) Weight: 122 lb 2 oz (55.4 kg) IBW/kg (Calculated) : 43.2  Temp (24hrs), Avg:98.9 F (37.2 C), Min:97.9 F (36.6 C), Max:101 F (38.3 C)   Recent Labs Lab 04-12-15 0948 01/31/16 1805 02/01/16 0450 02/02/16 0444  WBC 9.5 12.6* 11.5* 8.7  CREATININE 1.55*  --  1.24* 1.09*    Estimated Creatinine Clearance: 28.7 mL/min (by C-G formula based on SCr of 1.09 mg/dL (H)).    Allergies  Allergen Reactions  . Niacin And Related   . Nitrates, Organic Other (See Comments)    Reaction: unknown  . Penicillins Other (See Comments)    Has patient had a PCN reaction causing immediate rash, facial/tongue/throat swelling, SOB or lightheadedness with hypotension: unknown  Has patient had a PCN reaction causing severe rash involving mucus membranes or skin necrosis: unknown Has patient had a PCN reaction that required hospitalization: unknown Has patient had a PCN reaction occurring within the last 10 years: unknown If all of the above answers are "NO", then may proceed with Cephalosporin use.  Reactions not listed on MAR  . Sulfa Antibiotics Other (See Comments)    Reaction: unknown     Antimicrobials this admission: cefepime 12/17 >>  vancomcyin 12/17 >>   Dose adjustments this admission:   Microbiology results: BCx: NGTD 12/16 UCx: sent   12/16 MRSA PCR: positive  Thank you for allowing pharmacy to be a part of this patient's care.  Delsa BernKelly m Calli Bashor, PharmD 02/02/2016 8:52 AM

## 2016-02-02 NOTE — Progress Notes (Signed)
Daily Progress Note   Patient Name: Martha Hunter       Date: 02/02/2016 DOB: Aug 15, 1928  Age: 82 y.o. MRN#: [id number] Attending Physician: Nicholes Mango, MD Primary Care Physician: Marcello Fennel, MD Admit Date: 01/20/2016  Reason for Consultation/Follow-up: Establishing goals of care  Subjective: I met today with Ms. Gilles son, daughter, and granddaughters at bedside. Unfortunately Ms. Melman appears worse today. She has been febrile and has labored breathing. Family has noted decline and have already discussed and have decided that she would not want to be prolonged in this state. They are requesting full comfort care which is very appropriate. Emotional support provided. They would like to transition to hospice facility if she is stable - with her labored breathing and decline I do not feel she is currently stable for transfer. May have hospital death.   Length of Stay: 3  Current Medications: Scheduled Meds:  . mupirocin ointment  1 application Nasal BID    Continuous Infusions: . sodium chloride 50 mL/hr at 02/02/16 0545    PRN Meds: [DISCONTINUED] acetaminophen **OR** [DISCONTINUED] acetaminophen (TYLENOL) oral liquid 160 mg/5 mL **OR** acetaminophen, antiseptic oral rinse, glycopyrrolate, haloperidol lactate, morphine injection, polyvinyl alcohol  Physical Exam  Constitutional: She appears well-developed.  HENT:  Head: Normocephalic and atraumatic.  Cardiovascular: Tachycardia present.   Pulmonary/Chest: Accessory muscle usage present. Tachypnea noted. She is in respiratory distress.  Abdominal: Soft. Normal appearance.  Neurological:  Minimally responsive - moan to stimulation  Nursing note and vitals reviewed.           Vital Signs: BP (!) 151/68 (BP Location:  Left Arm)   Pulse 80   Temp 98.4 F (36.9 C) (Oral)   Resp (!) 22   Ht 4' 11"  (1.499 m)   Wt 55.4 kg (122 lb 2 oz)   SpO2 96%   BMI 24.67 kg/m  SpO2: SpO2: 96 % O2 Device: O2 Device: Nasal Cannula O2 Flow Rate: O2 Flow Rate (L/min): 3 L/min  Intake/output summary:  Intake/Output Summary (Last 24 hours) at 02/02/16 1051 Last data filed at 02/02/16 0311  Gross per 24 hour  Intake          1409.17 ml  Output                0 ml  Net  1409.17 ml   LBM: Last BM Date: 02/01/16 Baseline Weight: Weight: 55.4 kg (122 lb 2 oz) Most recent weight: Weight: 55.4 kg (122 lb 2 oz)       Palliative Assessment/Data: PPS: 10%     Patient Active Problem List   Diagnosis Date Noted  . Acute CVA (cerebrovascular accident) ([place]) 02/03/2016  . Protein-calorie malnutrition, severe 08/31/2015  . Acute encephalopathy 08/28/2015  . Pressure ulcer 08/28/2015  . Acute renal failure (ARF) ([place]) 07/17/2015  . Hypokalemia 07/16/2015  . Metabolic encephalopathy 54/27/0623  . Altered mental status 05/27/2015  . Anxiety 10/14/2014  . Clinical depression 10/14/2014  . Dizziness 10/14/2014  . HLD (hyperlipidemia) 10/14/2014  . BP (high blood pressure) 10/14/2014  . Adiposity 10/14/2014  . Unstable bladder 10/14/2014  . Cystocele 10/14/2014  . Vaginal atrophy 10/14/2014  . CAP (community acquired pneumonia) 07/23/2014  . Chronic kidney disease (CKD), stage III (moderate) 04/07/2014    Palliative Care Assessment & Plan   HPI: 82 yo female with PMH significant for HTN, HLD, TIA, UTI admitted 01/18/2016 with weakness, slurred speech, trouble swallowing, slurred speech and right-sided facial droop. MRI results show acute left basal ganglia infarct.   Assessment: Lying in bed, labored breathing. Family at bedside.   Recommendations/Plan:  Dyspnea/pain: Morphine 2 mg every hour prn. Minimal effect after reassessment. Morphine 2 mg every 4 hours scheduled.   Anxiety: Haldol prn.    Secretions: Robinul prn.   Fever: Acetaminophen supp prn (may consider scheduled).   Goals of Care and Additional Recommendations:  Limitations on Scope of Treatment: Full Comfort Care  Code Status:  DNR  Prognosis:   Hours - Days  Discharge Planning:  Hospital death vs hospice facility.   Care plan was discussed with family, RN Estill Bamberg, Dr. Margaretmary Eddy.  Thank you for allowing the Palliative Medicine Team to assist in the care of this patient.   Time In: 1100 Time Out: 1130 Total Time 79mn Prolonged Time Billed  no       Greater than 50%  of this time was spent counseling and coordinating care related to the above assessment and plan.  AVinie Sill NP Palliative Medicine Team Pager # [phone](M-F 8a-5p) Team Phone # [phone](Nights/Weekends)

## 2016-02-02 NOTE — Progress Notes (Signed)
OT Cancellation Note  Patient Details Name: Otis DialsRuth Corne MRN: [id number] DOB: [birth year]   Cancelled Treatment:    Reason Eval/Treat Not Completed: Other (comment) (Pt. is transitioning to comfort care. Will complete OT orders.)  Olegario MessierElaine Shakiara Lukic, MS, OTR/L 02/02/2016, 9:28 AM

## 2016-02-03 DIAGNOSIS — Z7189 Other specified counseling: Secondary | ICD-10-CM

## 2016-02-03 LAB — URINE CULTURE: Culture: >=100000 — AB

## 2016-02-03 NOTE — Plan of Care (Signed)
Problem: Coping: Goal: Ability to identify and develop effective coping behavior will improve Outcome: Progressing Family at Bedside. Spoke with family regarding chaplain available at any time.  Problem: Role Relationship: Goal: Familys ability to cope with current situation will improve Outcome: Progressing Family at Bedside. Spoke with family regarding chaplain available at any time.  Problem: Pain Management: Goal: General experience of comfort will improve Outcome: Progressing Morphine scheduled every 4 hours. Morphine ordered every 1 hour as needed.

## 2016-02-03 NOTE — Progress Notes (Signed)
[facility] [place] [facility]   PATIENT NAME: Martha DialsRuth Lowe    MR#:  [id number]  DATE OF BIRTH:  01-29-1929  SUBJECTIVE:  CHIEF COMPLAINT:  Patient is comfort care Clinically deteriorating. Family bedside   REVIEW OF SYSTEMS:  Review of systems unobtainable, patient has chronic dementia and now slurry speech  DRUG ALLERGIES:   Allergies  Allergen Reactions  . Niacin And Related   . Nitrates, Organic Other (See Comments)    Reaction: unknown  . Penicillins Other (See Comments)    Has patient had a PCN reaction causing immediate rash, facial/tongue/throat swelling, SOB or lightheadedness with hypotension: unknown  Has patient had a PCN reaction causing severe rash involving mucus membranes or skin necrosis: unknown Has patient had a PCN reaction that required hospitalization: unknown Has patient had a PCN reaction occurring within the last 10 years: unknown If all of the above answers are "NO", then may proceed with Cephalosporin use.  Reactions not listed on MAR  . Sulfa Antibiotics Other (See Comments)    Reaction: unknown     VITALS:  Blood pressure (!) 86/38, pulse 92, temperature 98.2 F (36.8 C), temperature source Oral, resp. rate 16, height 4\' 11"  (1.499 m), weight 55.4 kg (122 lb 2 oz), SpO2 95 %.  PHYSICAL EXAMINATION:  GENERAL:  82 y.o.-year-old patient lying in the bed with no acute distress. Resting comfortably HEENT: Head atraumatic, normocephalic. Oropharynx and nasopharynx clear.  NECK:  Supple, no jugular venous distention. No thyroid enlargement, no tenderness.  LUNGS: Diminished breath sounds bilaterally, no wheezing, rales,rhonchi or crepitation. No use of accessory muscles of respiration.  NEUROLOGIC: Patient is lethargic. Gait not checked.  PSYCHIATRIC: The patient is disoriented     LABORATORY PANEL:   CBC  Recent Labs Lab 02/02/16 0444  WBC 8.7  HGB 10.6*  HCT 31.9*  PLT 201    ------------------------------------------------------------------------------------------------------------------  Chemistries   Recent Labs Lab 02/02/16 0444  NA 141  K 3.6  CL 111  CO2 23  GLUCOSE 91  BUN 27*  CREATININE 1.09*  CALCIUM 8.9   ------------------------------------------------------------------------------------------------------------------  Cardiac Enzymes  Recent Labs Lab 02/11/2016 0948  TROPONINI <0.03   ------------------------------------------------------------------------------------------------------------------  RADIOLOGY:  No results found.  EKG:   Orders placed or performed during the hospital encounter of 02/06/2016  . ED EKG  . ED EKG    ASSESSMENT AND PLAN:   #Sepsis On broad-spectrum IV antibiotics cefepime and vancomycin,Which were discontinued as patient is Comfort Care   #Hematochezia  # Acute CVA with dysarthria and dysphagia and chronic dementia   #Acute renal failure. normal saline IV and follow-up BMP.  #Hypertension. Patient is nothing by mouth  #Chronic diarrhea. Continue  when necessary and probiotics.    Goals of care-discussed with patient's son healthcare power of attorney  understands patient's current Situation and poor prognosis agreeable with DO NOT RESUSCITATE and comfort care measures  All the records are reviewed and case discussed with Care Management/Social Workerr. Management plans discussed with the patient's son [name] and they are in agreement.  CODE STATUS: dnr ,Son is the healthcare power of attorney,   TOTAL TIME TAKING CARE OF THIS PATIENT: 33 minutes.   POSSIBLE D/C IN 2  DAYS, DEPENDING ON CLINICAL CONDITION.  Note: This dictation was prepared with Dragon dictation along with smaller phrase technology. Any transcriptional errors that result from this process are unintentional.   Ramonita LabGouru, Lavana Huckeba M.D on 02/03/2016 at 3:12 PM  Between 7am to 6pm - Pager -  [phone] After 6pm  go to [email or web address] - password [email or web address] [facility]  [facility]  Office  [phone]  CC: Primary care physician; BABAOFF, Lavada MesiMARC E, MD

## 2016-02-03 NOTE — Care Management Important Message (Signed)
Important Message  Patient Details  Name: Otis DialsRuth Prezioso MRN: [id number] Date of Birth: 03-13-1928   Medicare Important Message Given:  Yes    Gwenette GreetBrenda S Darean Rote, RN 02/03/2016, 11:53 AM

## 2016-02-03 NOTE — Progress Notes (Signed)
Daily Progress Note   Patient Name: Martha Hunter       Date: 02/03/2016 DOB: February 08, 1929  Age: 82 y.o. MRN#: [id number] Attending Physician: Nicholes Mango, MD Primary Care Physician: Marcello Fennel, MD Admit Date: 02/13/2016  Reason for Consultation/Follow-up: Establishing goals of care  Subjective: I met today at Ms. [facility] bedside along with multiple family including her son and daughter. She has clearly progressed since yesterday. Breathing is no longer labored but is irregular and very shallow. Family explains that this breathing pattern is new since this morning when they arrived. She continues to appear febrile to me today. Not responding at all and muscles, specifically jaw, is relaxed and drawn. Progressing quickly and at this time I expect hospital death. Discussed with family. All questions/concerns addressed. Emotional support provided.   Length of Stay: 4  Current Medications: Scheduled Meds:  .  morphine injection  2 mg Intravenous Q4H  . mupirocin ointment  1 application Nasal BID    Continuous Infusions: . sodium chloride 10 mL/hr at 02/02/16 1054    PRN Meds: [DISCONTINUED] acetaminophen **OR** [DISCONTINUED] acetaminophen (TYLENOL) oral liquid 160 mg/5 mL **OR** acetaminophen, antiseptic oral rinse, glycopyrrolate, haloperidol lactate, morphine injection, polyvinyl alcohol  Physical Exam  Constitutional: She appears well-developed.  HENT:  Head: Normocephalic and atraumatic.  Cardiovascular: Tachycardia present.   Pulmonary/Chest: Accessory muscle usage present. Tachypnea noted. She is in respiratory distress.  Abdominal: Soft. Normal appearance.  Neurological:  Minimally responsive - moan to stimulation  Nursing note and vitals reviewed.           Vital  Signs: BP (!) 86/38 (BP Location: Left Arm)   Pulse 92   Temp 98.2 F (36.8 C) (Oral)   Resp 16   Ht 4' 11"  (1.499 m)   Wt 55.4 kg (122 lb 2 oz)   SpO2 95%   BMI 24.67 kg/m  SpO2: SpO2: 95 % O2 Device: O2 Device: Nasal Cannula O2 Flow Rate: O2 Flow Rate (L/min): 2 L/min  Intake/output summary: No intake or output data in the 24 hours ending 02/03/16 1316 LBM: Last BM Date: 02/01/16 Baseline Weight: Weight: 55.4 kg (122 lb 2 oz) Most recent weight: Weight: 55.4 kg (122 lb 2 oz)       Palliative Assessment/Data: PPS: 10%     Patient Active Problem List  Diagnosis Date Noted  . Terminal care   . Palliative care encounter   . Acute CVA (cerebrovascular accident) ([place]) 02/08/2016  . Protein-calorie malnutrition, severe 08/31/2015  . Acute encephalopathy 08/28/2015  . Pressure ulcer 08/28/2015  . Acute renal failure (ARF) ([place]) 07/17/2015  . Hypokalemia 07/16/2015  . Metabolic encephalopathy 61/90/1222  . Altered mental status 05/27/2015  . Anxiety 10/14/2014  . Clinical depression 10/14/2014  . Dizziness 10/14/2014  . HLD (hyperlipidemia) 10/14/2014  . BP (high blood pressure) 10/14/2014  . Adiposity 10/14/2014  . Unstable bladder 10/14/2014  . Cystocele 10/14/2014  . Vaginal atrophy 10/14/2014  . CAP (community acquired pneumonia) 07/23/2014  . Chronic kidney disease (CKD), stage III (moderate) 04/07/2014    Palliative Care Assessment & Plan   HPI: 82 yo female with PMH significant for HTN, HLD, TIA, UTI admitted 01/23/2016 with weakness, slurred speech, trouble swallowing, slurred speech and right-sided facial droop. MRI results show acute left basal ganglia infarct.   Assessment: Lying in bed, unresponsive. Progressing at end of life. Family at bedside.   Recommendations/Plan:  Dyspnea/pain: Morphine 2 mg every hour prn. Minimal effect after reassessment. Morphine 2 mg every 4 hours scheduled.   Anxiety: Haldol prn.   Secretions: Robinul prn.   Fever:  Acetaminophen supp prn (may consider scheduled).   Goals of Care and Additional Recommendations:  Limitations on Scope of Treatment: Full Comfort Care  Code Status:  DNR  Prognosis:   Hours - Days  Discharge Planning:  Hospital death anticipated  Care plan was discussed with family, RN Daleen Snook, Dr. Margaretmary Eddy.  Thank you for allowing the Palliative Medicine Team to assist in the care of this patient.   Time In: 1250 Time Out: 1310 Total Time 84mn Prolonged Time Billed  no       Greater than 50%  of this time was spent counseling and coordinating care related to the above assessment and plan.  AVinie Sill NP Palliative Medicine Team Pager # [phone](M-F 8a-5p) Team Phone # [phone](Nights/Weekends)

## 2016-02-03 NOTE — Progress Notes (Signed)
Nutrition Brief Follow-Up Note  Chart reviewed and discussed with RN. Patient now transitioning to comfort care.   Patient to remain NPO. No nutrition interventions warranted at this time. Please consult Dietitian as needed.   Helane RimaLeanne Jahron Hunsinger, MS, RD, LDN Pager: [phone] After Hours Pager: [phone]

## 2016-02-05 LAB — CULTURE, BLOOD (ROUTINE X 2)
CULTURE: NO GROWTH
CULTURE: NO GROWTH

## 2016-02-15 NOTE — Progress Notes (Signed)
Daily Progress Note   Patient Name: Martha Hunter       Date: 04/29/15 DOB: Mar 02, 1928  Age: 83 y.o. MRN#: [id number] Attending Physician: Ramonita LabAruna Gouru, MD Primary Care Physician: Rozanna BoxBABAOFF, MARC E, MD Admit Date: 01/29/2016  Reason for Consultation/Follow-up: Establishing goals of care  Subjective: Ms. Martha Hunter is still actively dying. I do notice episodes of apnea although infrequently. Son and daughter at bedside. They continue to be pleased with her level of comfort. They are prepared for her to pass. Therapeutic listening and emotional support provided. Oxygen removed now that no labored breathing. I still do not believe that she is stable to be moved.    Length of Stay: 5  Current Medications: Scheduled Meds:  .  morphine injection  2 mg Intravenous Q4H  . mupirocin ointment  1 application Nasal BID    Continuous Infusions: . sodium chloride 10 mL/hr at 02/03/16 1624    PRN Meds: [DISCONTINUED] acetaminophen **OR** [DISCONTINUED] acetaminophen (TYLENOL) oral liquid 160 mg/5 mL **OR** acetaminophen, antiseptic oral rinse, glycopyrrolate, haloperidol lactate, morphine injection, polyvinyl alcohol  Physical Exam  Constitutional: She appears well-developed.  HENT:  Head: Normocephalic and atraumatic.  Cardiovascular: Tachycardia present.   Pulmonary/Chest: Accessory muscle usage present. Tachypnea noted. She is in respiratory distress.  Abdominal: Soft. Normal appearance.  Neurological:  Minimally responsive - moan to stimulation  Nursing note and vitals reviewed.           Vital Signs: BP (!) 112/43 (BP Location: Left Arm)   Pulse (!) 105   Temp (!) 100.4 F (38 C) (Oral)   Resp 20   Ht 4\' 11"  (1.499 m)   Wt 122 lb 2 oz (55.4 kg)   SpO2 90%   BMI 24.67 kg/m  SpO2:  SpO2: 90 % O2 Device: O2 Device: Nasal Cannula O2 Flow Rate: O2 Flow Rate (L/min): 2 L/min  Intake/output summary:   Intake/Output Summary (Last 24 hours) at 07/29/2015 0926 Last data filed at 07/29/2015 0300  Gross per 24 hour  Intake              401 ml  Output                0 ml  Net              401 ml   LBM:  Last BM Date: 02/01/16 Baseline Weight: Weight: 122 lb 2 oz (55.4 kg) Most recent weight: Weight: 122 lb 2 oz (55.4 kg)       Palliative Assessment/Data: PPS: 10%     Patient Active Problem List   Diagnosis Date Noted  . Goals of care, counseling/discussion   . Terminal care   . Palliative care encounter   . Acute CVA (cerebrovascular accident) ([facility]) 05/22/15  . Protein-calorie malnutrition, severe 08/31/2015  . Acute encephalopathy 08/28/2015  . Pressure ulcer 08/28/2015  . Acute renal failure (ARF) ([facility]) 07/17/2015  . Hypokalemia 07/16/2015  . Metabolic encephalopathy 05/29/2015  . Altered mental status 05/27/2015  . Anxiety 10/14/2014  . Clinical depression 10/14/2014  . Dizziness 10/14/2014  . HLD (hyperlipidemia) 10/14/2014  . BP (high blood pressure) 10/14/2014  . Adiposity 10/14/2014  . Unstable bladder 10/14/2014  . Cystocele 10/14/2014  . Vaginal atrophy 10/14/2014  . CAP (community acquired pneumonia) 07/23/2014  . Chronic kidney disease (CKD), stage III (moderate) 04/07/2014    Palliative Care Assessment & Plan   HPI: 83 yo female with PMH significant for HTN, HLD, TIA, UTI admitted 01/16/16 with weakness, slurred speech, trouble swallowing, slurred speech and right-sided facial droop. MRI results show acute left basal ganglia infarct.   Assessment: Lying in bed, unresponsive. Progressing at end of life. Family at bedside.   Recommendations/Plan:  Dyspnea/pain: Morphine 2 mg every hour prn. Morphine 2 mg every 4 hours scheduled.   Anxiety: Haldol prn.   Secretions: Robinul prn.   Fever: Acetaminophen supp prn (may consider  scheduled).   Goals of Care and Additional Recommendations:  Limitations on Scope of Treatment: Full Comfort Care  Code Status:  DNR  Prognosis:   Hours - Days  Discharge Planning:  Hospital death anticipated  Care plan was discussed with family, RN Dot LanesKrista, Dr. Amado CoeGouru.  Thank you for allowing the Palliative Medicine Team to assist in the care of this patient.   Time In: 0900 Time Out: 0930 Total Time 30min Prolonged Time Billed  no       Greater than 50%  of this time was spent counseling and coordinating care related to the above assessment and plan.  Yong ChannelAlicia Myrical Andujo, NP Palliative Medicine Team Pager # [phone] (M-F 8a-5p) Team Phone # [phone] (Nights/Weekends)

## 2016-02-15 NOTE — Progress Notes (Signed)
This nurse called to room by pt's family.  Pt not breathing and no pulse found.  Levin ErpMeghan Oakely, RN second verification.  Time of Death is 1305. Dr. Amado CoeGouru and nursing supervisor made aware.Pt's family at bedside.  Family requesting [employer] [facility] [place].   Donor services notified of death, referral number [id number] Cammy BrochureLatoshia Moore.  Orson Apeanielle Billey Wojciak, RN

## 2016-02-15 NOTE — Discharge Summary (Signed)
Date of admission 2015-12-10 Date of death 02/01/2016 at 1:05 PM  Patient admitted with a chief complaint of weakness slurred  speech. Please review history and physical for details  Martha Hunter  is a 83 y.o. female with a known history of Hypertension, hyperlipidemia, TIA, UTI and dementia. The patient woke up this morning and was unable to take her pills. She was found with slurred speech and trouble swallowing. The patient is demented and not a good historian. She was noticed right-sided facial droop and slurred speech in the ED, suspected acute CVA. But head CT is negative and she is not good candidate for TPA treatment per ED physician. She also has chronic diarrhea which is worse at admission  Hospital course  Patient had workup for acute CVA during the hospital course.  she was on aspirin hematochezia was noticed. Heparin subcutaneous and aspirin were discontinued.. Patient was started on broad-spectrum IV antibiotics with cefepime and vancomycin for sepsis. Patient clinically did not improve despite antibiotics and IV fluids. Patient's son Martha Hunter- HCPOA has requested DO NOT RESUSCITATE with comfort care measures. All other family members were agreeable Patient was made comfort care and palliative care was consulted. Patient deceased peacefully today at 1:05 PM. Condolences were expressed to the family

## 2016-02-15 NOTE — Progress Notes (Signed)
Pt transported to the morgue. Pt's son, Mauri ReadingJay Crumpler took pt's blanket from home and gold wedding band.  Gold looking watch still in place on pt's left wrist, as family has left, watch still present on body when transported to the morgue.  Nursing Supervisor Dewayne Hatchnn made aware and stated she will let family and funeral home know.  Orson Apeanielle Teniya Filter, RN

## 2016-02-15 DEATH — deceased

## 2017-08-27 IMAGING — US US CAROTID DUPLEX BILAT
1 series · 13 of 24 positions shown · non-contrast
Comparison: None.

CLINICAL DATA: Slurred speech, hypertension, TIA.

EXAM:
BILATERAL CAROTID DUPLEX ULTRASOUND
TECHNIQUE: Gray scale imaging, color Doppler and duplex ultrasound were
performed of bilateral carotid and vertebral arteries in the neck.

[Series 1: us carotid duplex bilat · 0.05mm/px · 13 of 64 slices shown]
[im 1/64]
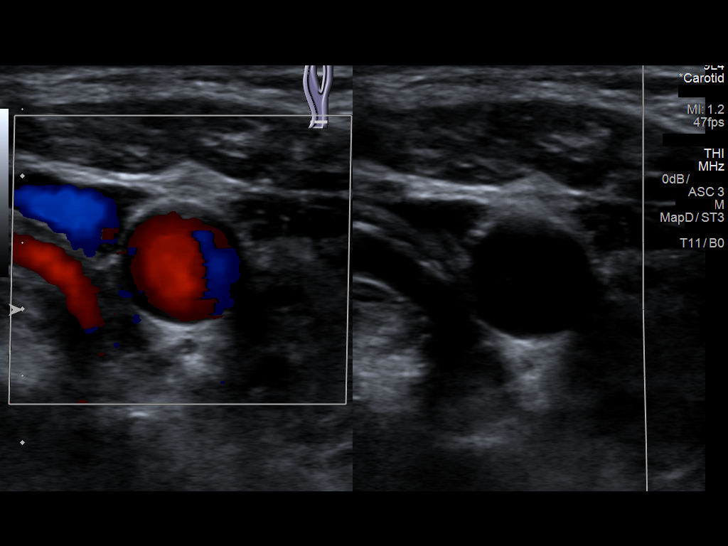
[im 6/64]
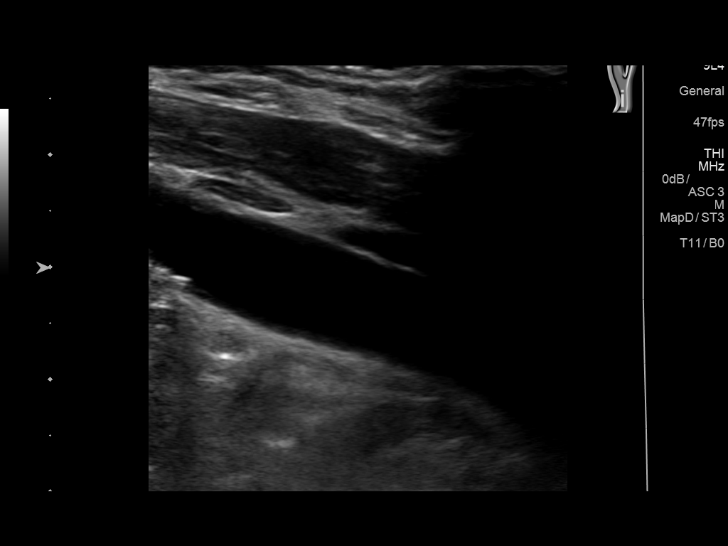
[im 11/64]
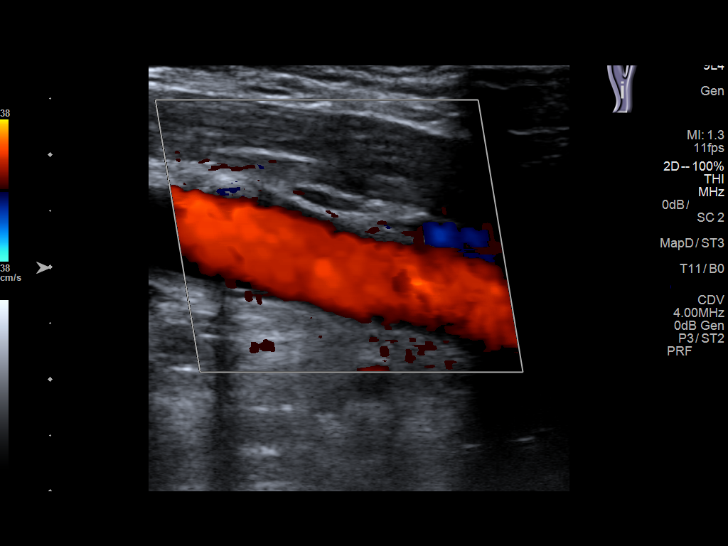
[im 17/64]
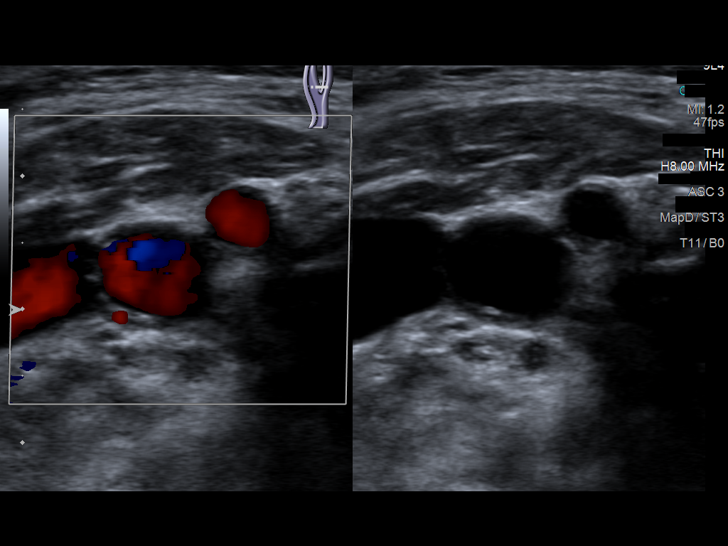
[im 22/64]
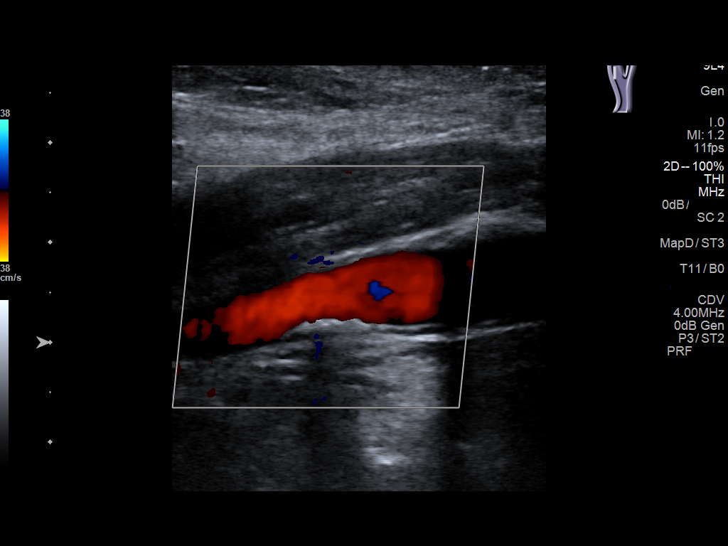
[im 28/64]
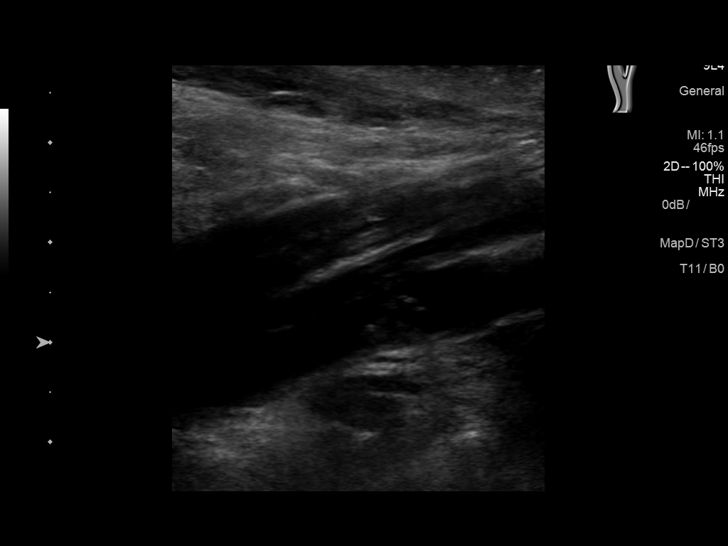
[im 33/64]
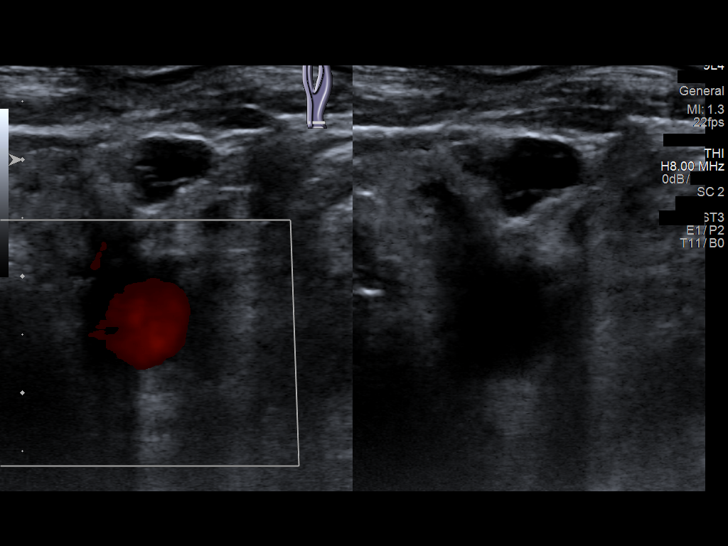
[im 36/64]
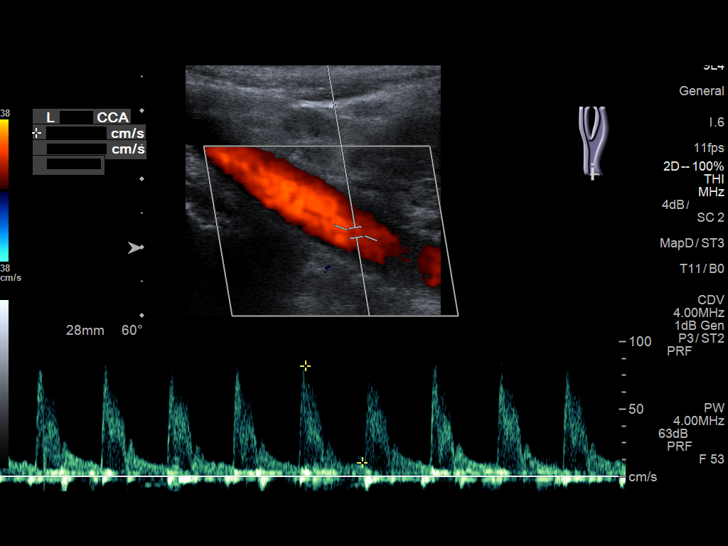
[im 42/64]
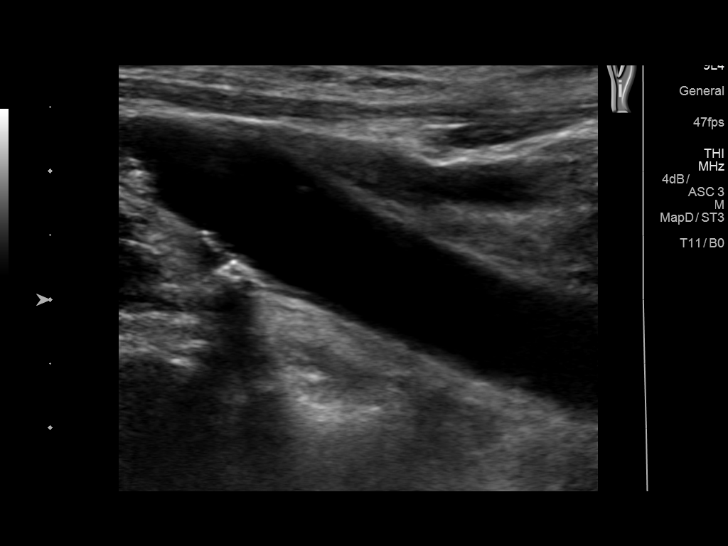
[im 47/64]
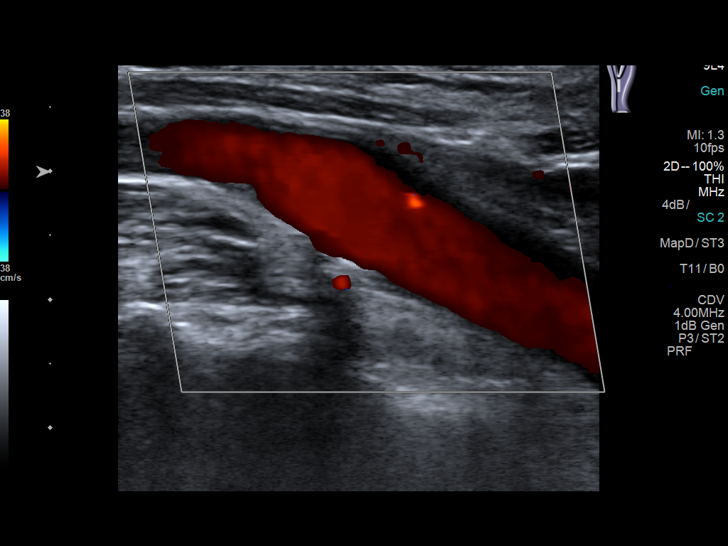
[im 53/64]
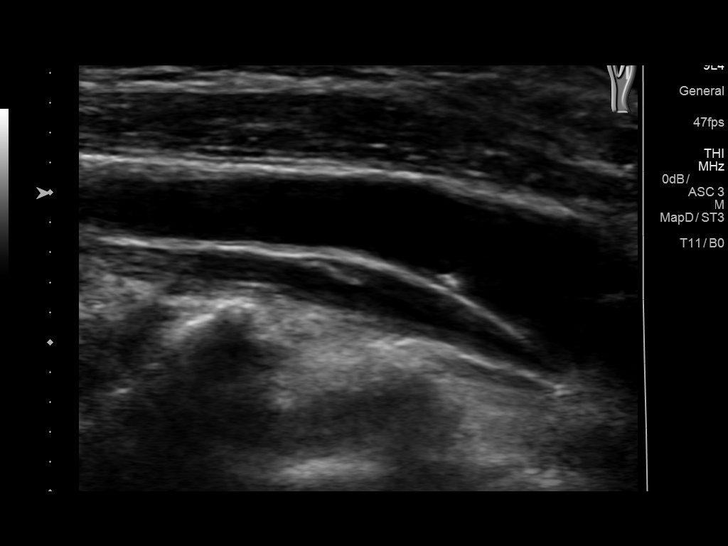
[im 58/64]
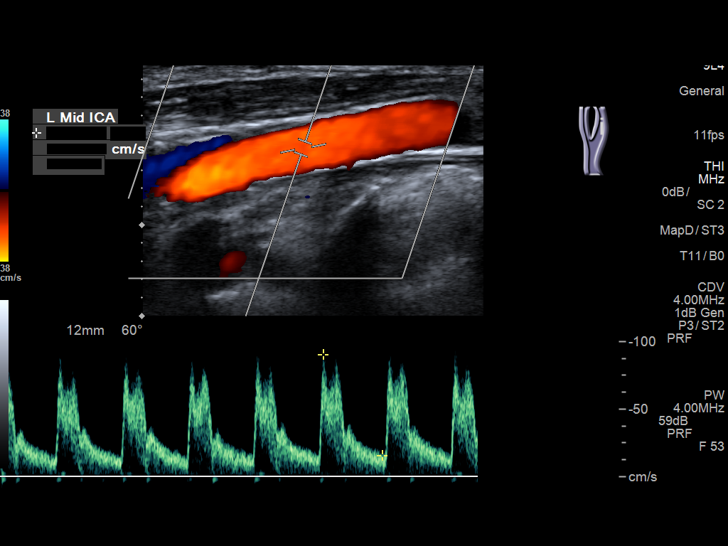
[im 64/64]
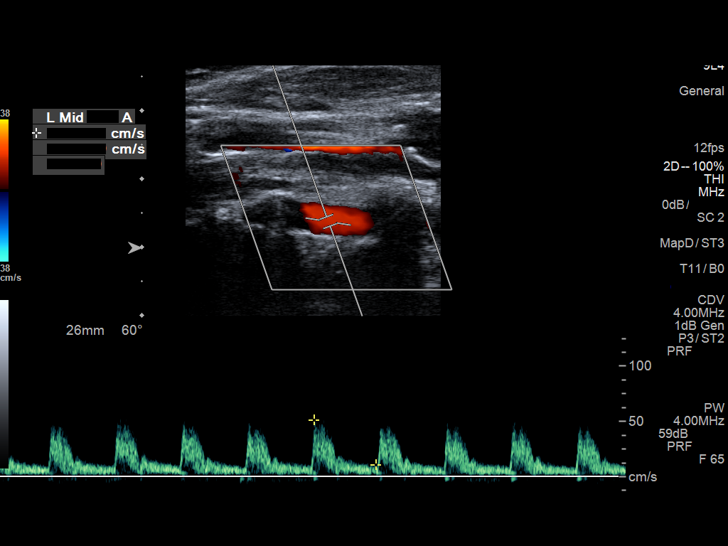

[13 of 24 positions shown; findings below may reference images not displayed]

FINDINGS: Criteria: Quantification of carotid stenosis is based on velocity
parameters that correlate the residual internal carotid diameter
with NASCET-based stenosis levels, using the diameter of the distal
internal carotid lumen as the denominator for stenosis measurement.

The following velocity measurements were obtained:

RIGHT

ICA:  84/19 cm/sec

CCA:  93/11 cm/sec

SYSTOLIC ICA/CCA RATIO:

DIASTOLIC ICA/CCA RATIO:

ECA:  111 cm/sec

LEFT

ICA:  112/26 cm/sec

CCA:  85/8 cm/sec

SYSTOLIC ICA/CCA RATIO:

DIASTOLIC ICA/CCA RATIO:

ECA:  127 cm/sec

RIGHT CAROTID ARTERY: There is a mild to moderate amount of
calcified plaque at the level of the distal bulb proximal right ICA.
Based on velocities, estimated right ICA stenosis is less than 50%.

RIGHT VERTEBRAL ARTERY: Antegrade flow with normal waveform and
velocity.

LEFT CAROTID ARTERY: There is a mild amount of calcified plaque at
the level of the carotid bulb and proximal ICA. Based on velocities,
estimated left ICA stenosis is less than 50%.

LEFT VERTEBRAL ARTERY: Antegrade flow with normal waveform and
velocity.
IMPRESSION: Plaque at the level of both carotid bulbs and proximal internal
carotid arteries, right greater than left. No significant carotid
stenosis identified with estimated bilateral ICA stenoses of less
than 50%.

## 2018-05-04 IMAGING — CT CT HEAD W/O CM
3 series · 15 of 47 positions shown, 18 images · non-contrast
Comparison: 08/28/2015

CLINICAL DATA: Facial droop.

EXAM:
CT HEAD WITHOUT CONTRAST
TECHNIQUE: Contiguous axial images were obtained from the base of the skull
through the vertex without intravenous contrast.

[Series 2: head wo · axial · 0.40mm/px · z∈[+514,+639]mm · 9 of 30 slices shown, 12 images]
[im 3/30  brain]
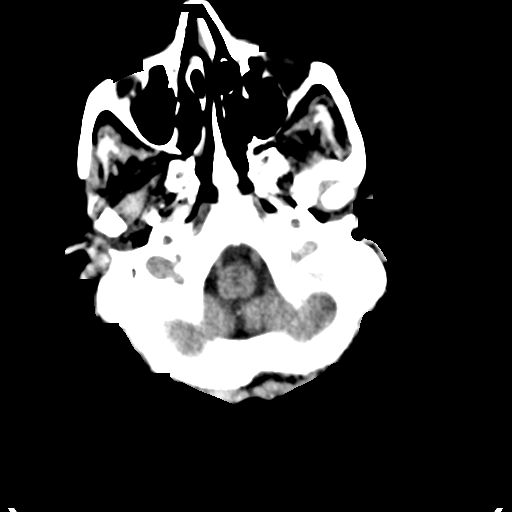
[im 3/30  bone]
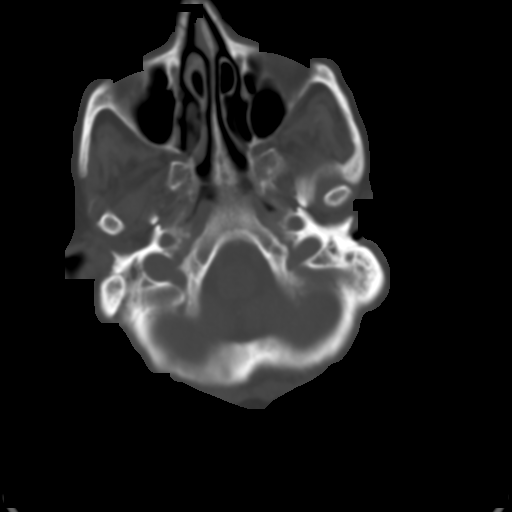
[im 6/30  brain]
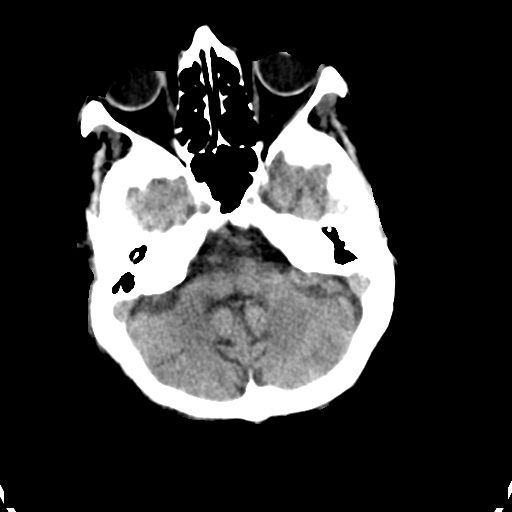
[im 9/30  brain]
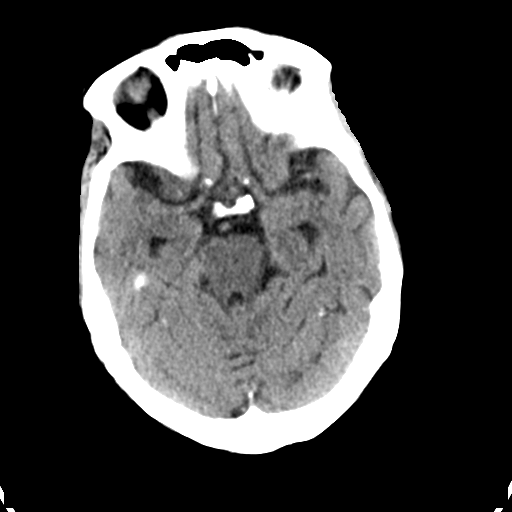
[im 12/30  brain]
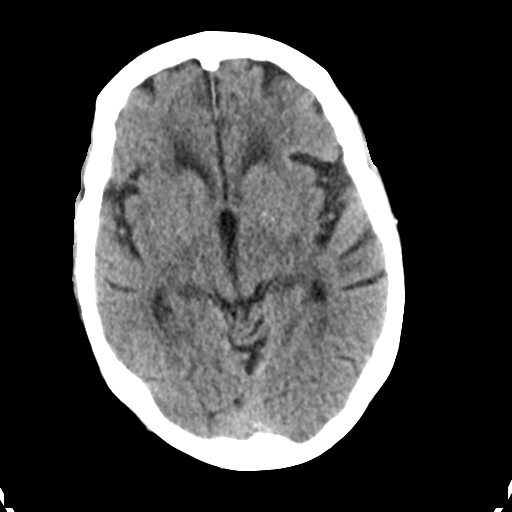
[im 16/30  brain]
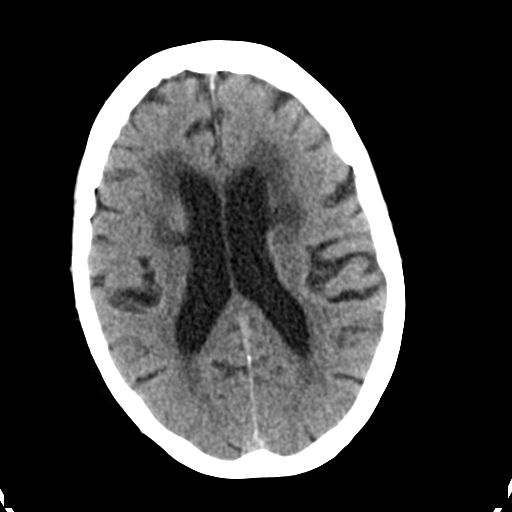
[im 16/30  bone]
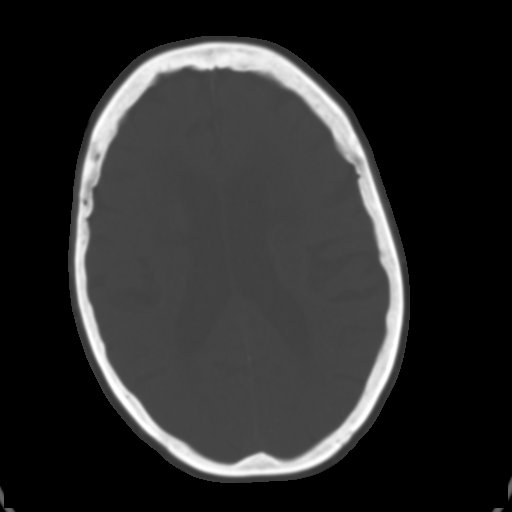
[im 19/30  brain]
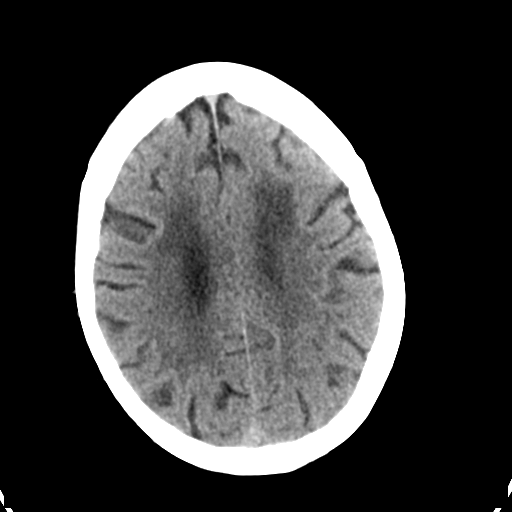
[im 22/30  brain]
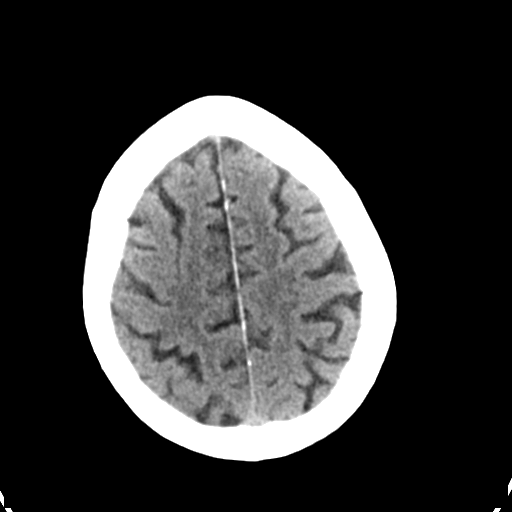
[im 25/30  brain]
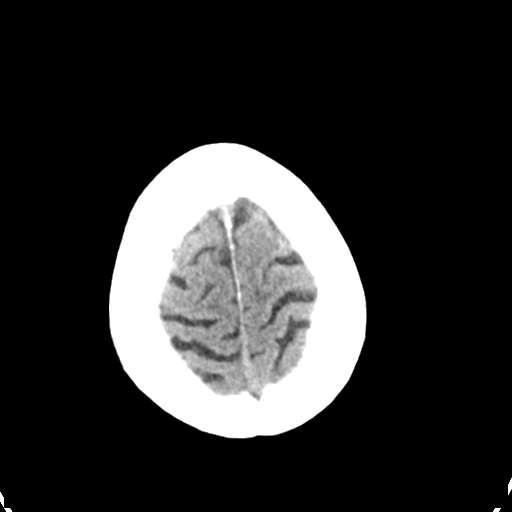
[im 28/30  brain]
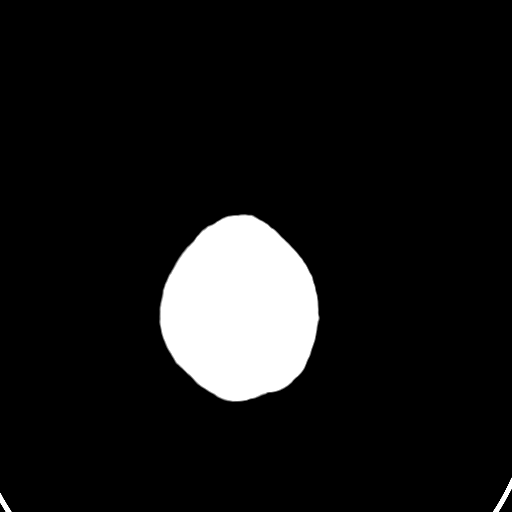
[im 28/30  bone]
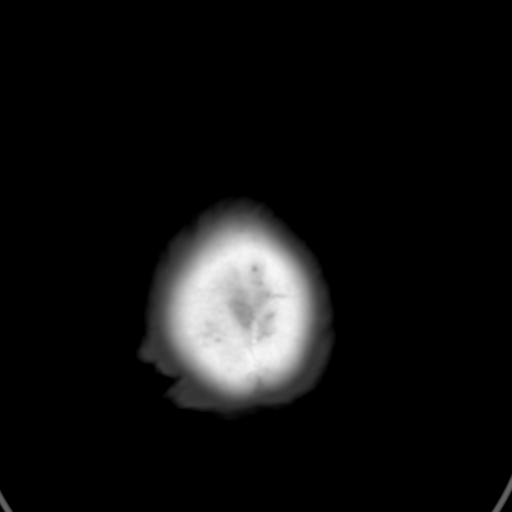

[Series 4: coronal soft tissue · coronal · 0.28mm/px · 3 of 61 slices shown]
[im 21/61  brain]
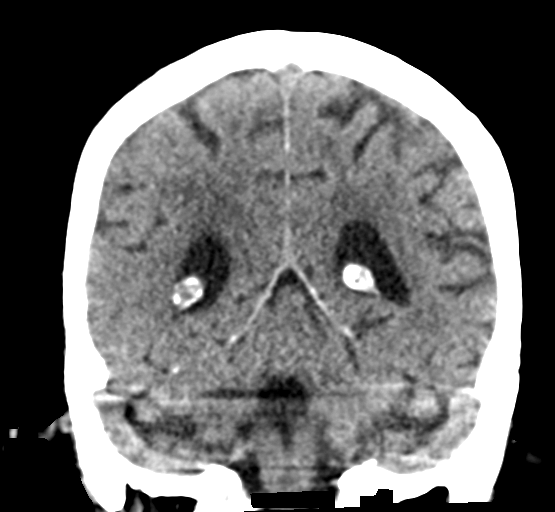
[im 27/61  brain]
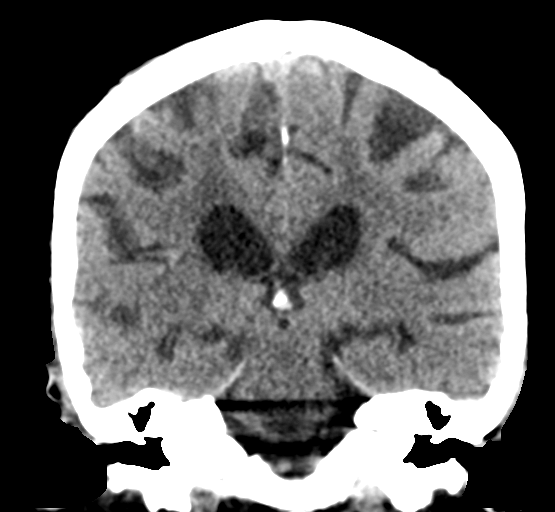
[im 34/61  brain]
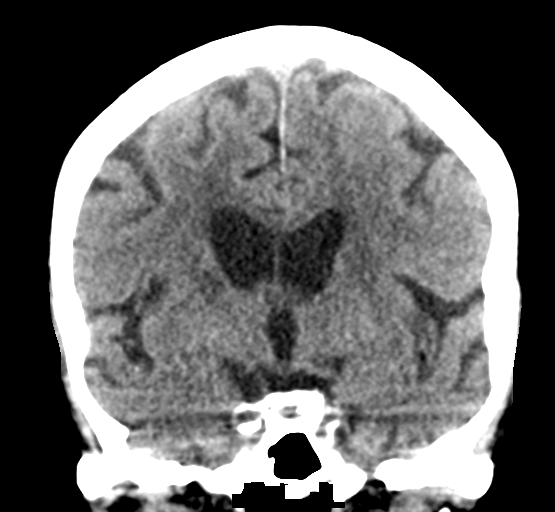

[Series 5: sagittal soft tissue · sagittal · 0.28mm/px · 3 of 46 slices shown]
[im 16/46  brain]
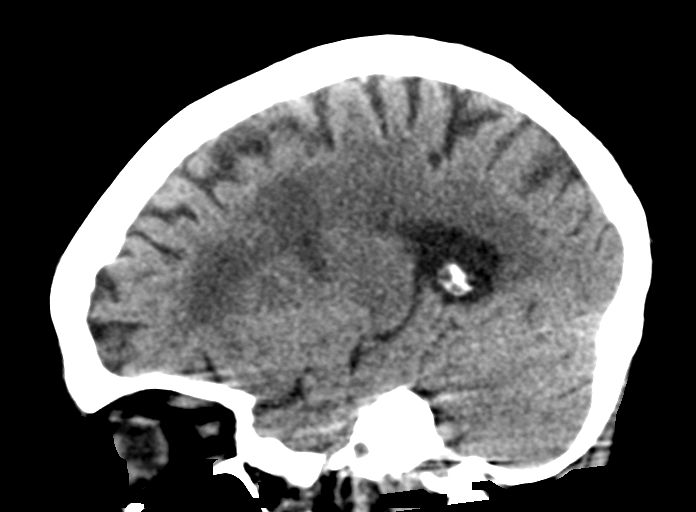
[im 23/46  brain]
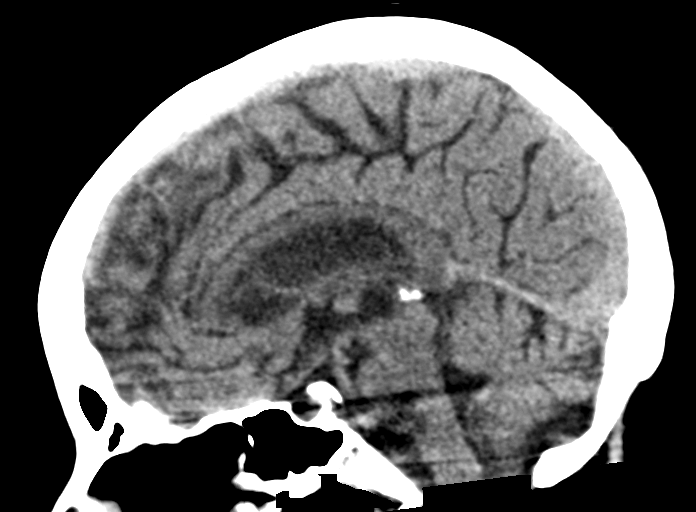
[im 31/46  brain]
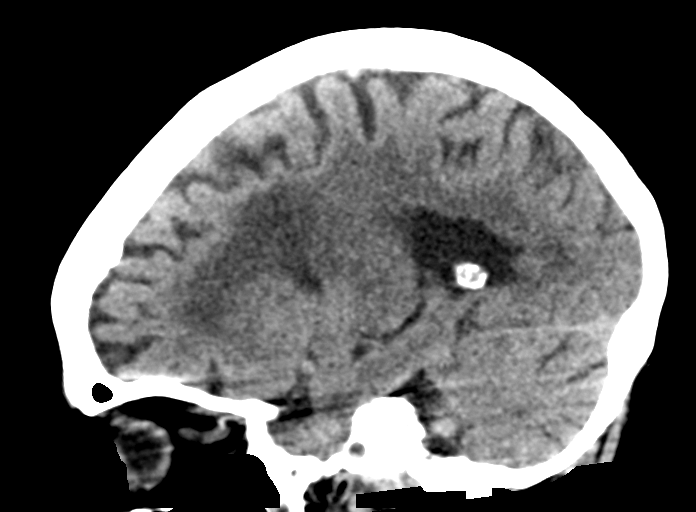

[15 of 47 positions shown; findings below may reference images not displayed]

FINDINGS: Brain: Old bilateral basal ganglia lacunar infarcts. There is
atrophy and chronic small vessel disease changes. No acute
intracranial abnormality. Specifically, no hemorrhage,
hydrocephalus, mass lesion, acute infarction, or significant
intracranial injury.

Vascular: No hyperdense vessel or unexpected calcification.

Skull: No acute calvarial abnormality.

Sinuses/Orbits: Visualized paranasal sinuses and mastoids clear.
Orbital soft tissues unremarkable.

Other: None
IMPRESSION: Old bilateral basal ganglia lacunar infarcts.

No acute intracranial abnormality.

Atrophy, chronic microvascular disease.
